# Patient Record
Sex: Female | Born: 1945 | Race: Black or African American | Hispanic: No | Marital: Single | Smoking: Former smoker
Health system: Southern US, Community
[De-identification: ages and names within clinical notes are randomized; demographics above are authoritative.]

## PROBLEM LIST (undated history)

## (undated) DIAGNOSIS — R32 Unspecified urinary incontinence: Secondary | ICD-10-CM

## (undated) DIAGNOSIS — I498 Other specified cardiac arrhythmias: Secondary | ICD-10-CM

## (undated) DIAGNOSIS — N189 Chronic kidney disease, unspecified: Secondary | ICD-10-CM

## (undated) DIAGNOSIS — F0150 Vascular dementia without behavioral disturbance: Secondary | ICD-10-CM

## (undated) DIAGNOSIS — E785 Hyperlipidemia, unspecified: Secondary | ICD-10-CM

## (undated) DIAGNOSIS — I639 Cerebral infarction, unspecified: Secondary | ICD-10-CM

## (undated) DIAGNOSIS — L608 Other nail disorders: Secondary | ICD-10-CM

## (undated) DIAGNOSIS — I6990 Unspecified sequelae of unspecified cerebrovascular disease: Secondary | ICD-10-CM

## (undated) DIAGNOSIS — R7989 Other specified abnormal findings of blood chemistry: Secondary | ICD-10-CM

## (undated) DIAGNOSIS — R413 Other amnesia: Secondary | ICD-10-CM

## (undated) DIAGNOSIS — G459 Transient cerebral ischemic attack, unspecified: Secondary | ICD-10-CM

## (undated) DIAGNOSIS — I10 Essential (primary) hypertension: Secondary | ICD-10-CM

## (undated) DIAGNOSIS — E559 Vitamin D deficiency, unspecified: Secondary | ICD-10-CM

## (undated) DIAGNOSIS — H269 Unspecified cataract: Secondary | ICD-10-CM

## (undated) DIAGNOSIS — R55 Syncope and collapse: Secondary | ICD-10-CM

## (undated) DIAGNOSIS — M8580 Other specified disorders of bone density and structure, unspecified site: Secondary | ICD-10-CM

## (undated) DIAGNOSIS — N289 Disorder of kidney and ureter, unspecified: Secondary | ICD-10-CM

## (undated) DIAGNOSIS — G4700 Insomnia, unspecified: Secondary | ICD-10-CM

## (undated) HISTORY — DX: Insomnia, unspecified: G47.00

## (undated) HISTORY — DX: Essential (primary) hypertension: I10

## (undated) HISTORY — DX: Other nail disorders: L60.8

## (undated) HISTORY — DX: Other specified abnormal findings of blood chemistry: R79.89

## (undated) HISTORY — DX: Transient cerebral ischemic attack, unspecified: G45.9

## (undated) HISTORY — DX: Other amnesia: R41.3

## (undated) HISTORY — DX: Syncope and collapse: R55

## (undated) HISTORY — DX: Cerebral infarction, unspecified: I63.9

## (undated) HISTORY — DX: Vitamin D deficiency, unspecified: E55.9

## (undated) HISTORY — DX: Other specified disorders of bone density and structure, unspecified site: M85.80

## (undated) HISTORY — DX: Hyperlipidemia, unspecified: E78.5

## (undated) HISTORY — DX: Unspecified cataract: H26.9

## (undated) HISTORY — DX: Chronic kidney disease, unspecified: N18.9

## (undated) HISTORY — DX: Unspecified urinary incontinence: R32

## (undated) HISTORY — DX: Disorder of kidney and ureter, unspecified: N28.9

## (undated) HISTORY — DX: Vascular dementia, unspecified severity, without behavioral disturbance, psychotic disturbance, mood disturbance, and anxiety: F01.50

## (undated) HISTORY — DX: Unspecified sequelae of unspecified cerebrovascular disease: I69.90

## (undated) HISTORY — PX: NO PAST SURGERIES: SHX2092

## (undated) HISTORY — DX: Other specified cardiac arrhythmias: I49.8

---

## 1998-12-21 ENCOUNTER — Encounter: Payer: Self-pay | Admitting: Internal Medicine

## 2007-04-15 ENCOUNTER — Inpatient Hospital Stay (HOSPITAL_COMMUNITY): Admission: EM | Admit: 2007-04-15 | Discharge: 2007-04-17 | Payer: Self-pay | Admitting: Emergency Medicine

## 2007-04-16 ENCOUNTER — Encounter: Payer: Self-pay | Admitting: Cardiology

## 2007-04-16 ENCOUNTER — Ambulatory Visit: Payer: Self-pay | Admitting: Vascular Surgery

## 2007-04-16 ENCOUNTER — Ambulatory Visit: Payer: Self-pay | Admitting: Cardiology

## 2007-05-18 ENCOUNTER — Encounter: Admission: RE | Admit: 2007-05-18 | Discharge: 2007-07-26 | Payer: Self-pay | Admitting: Internal Medicine

## 2007-06-22 ENCOUNTER — Encounter: Admission: RE | Admit: 2007-06-22 | Discharge: 2007-07-26 | Payer: Self-pay | Admitting: Specialist

## 2008-02-22 ENCOUNTER — Emergency Department (HOSPITAL_COMMUNITY): Admission: EM | Admit: 2008-02-22 | Discharge: 2008-02-22 | Payer: Self-pay | Admitting: Emergency Medicine

## 2008-06-13 ENCOUNTER — Inpatient Hospital Stay (HOSPITAL_COMMUNITY): Admission: EM | Admit: 2008-06-13 | Discharge: 2008-06-17 | Payer: Self-pay | Admitting: Emergency Medicine

## 2009-08-18 ENCOUNTER — Encounter: Admission: RE | Admit: 2009-08-18 | Discharge: 2009-08-18 | Payer: Self-pay | Admitting: Neurology

## 2011-04-22 NOTE — H&P (Signed)
NAMEMARZETTA, [name]                 ACCOUNT NO.:  [id number]   MEDICAL RECORD NO.:  [id number]          PATIENT TYPE:  INP   LOCATION:  0101                         FACILITY:  [facility]   PHYSICIAN:  Michelene Gardener, MD    DATE OF BIRTH:  [birth year]   DATE OF ADMISSION:  06/13/2008  DATE OF DISCHARGE:                              HISTORY & PHYSICAL   PRIMARY PHYSICIAN:  Dr. Barbee Shropshire.   CHIEF COMPLAINT:  The patient was brought for episode of fainting and  unresponsiveness.   HISTORY OF PRESENT ILLNESS:  This is 65 year old African American female  with past medical history of dementia, hyperlipidemia, hypertension and  stroke who presented with the above-mentioned complaint.  The patient  does not remember anything about what happened to.  Her daughter is  present at bedside, and most of the information was obtained from her.  As per her daughter, the patient went today for her senior citizen  exercise class.  While she was following the instructor, she became more  fatigued, and she was not able to do much, and all of the sudden, she  collapsed, and she was in the floor staring and was not responding to  any one.  That persisted for few minutes, and then she was awake but she  was confused and agitated.  They did not notice any tongue bite, and  they did not notice any shaking or urinary incontinence.  This patient  had 3 episodes before that.  The last one was around a few months ago,  and as per the daughter at that time, the patient was brought to the ER  but was sent home, and she was followed with her primary physician where  echocardiogram and carotid Doppler were done, and both of them were  negative.  A CT scan of the head was done in the ER, and it showed no  acute findings. Currently, the patient is awake, does not have any  complaints, denied chest pain.  There is no shortness of breath.  There  is no dizziness, and there are no palpitations.   PAST MEDICAL HISTORY:  Is  significant for:  1. Recurrent syncope that was attributed to low blood pressure.  2. Hypertension.  3. Diabetes mellitus.  4. Cataract.  5. Hyperlipidemia.   PAST SURGICAL HISTORY:  Denied.   ALLERGIES:  No known drug allergies.   CURRENT MEDICATIONS:  1. Aspirin 81 mg p.o. once daily.  2. Lisinopril/hydrochlorothiazide 20/12.5 mg twice daily.  3. Simvastatin 20 mg once a day.  4. Labetalol 200 mg twice daily.  5. Norvasc 10 mg once a day.  6. Aricept 20 mg once a day.   SOCIAL HISTORY:  Quit smoking a few months ago.  Prior to that, she was  smoking one pack per day for many years.  She denied alcohol drinking.  She has 1 child.  Her daughter, [name], is supportive, and her contact  number is [phone].   FAMILY HISTORY:  Her mother died at age 7; she had history of  hypertension.  Father died of unknown causes.  REVIEW OF SYSTEMS:  CONSTITUTIONAL:  No fatigability, no fever.  EYES:  No blurred vision.  ENT:  No tinnitus.  RESPIRATORY:  No cough or  wheezes.  CARDIOVASCULAR:  Positive for syncope.  There is no chest  pain.  There is no shortness of breath.  GASTROINTESTINAL:  No nausea,  no vomiting, no diarrhea.  GENITOURINARY:  No dysuria, no hematuria.  ENDOCRINE:  No polyuria, no nocturia.  HEMATOLOGIC:  No bruises, no  bleeding.  INFECTIOUS DISEASE:  No rash, no lesions.  NEUROLOGICAL:  No  numbness or tingling.  Rest of systems reviewed, and they were negative.   PHYSICAL EXAMINATION:  VITAL SIGNS: Temperature is 98.6, blood pressure  is 118/56, pulse 62, respiratory rate 18.  GENERAL APPEARANCE:  This is middle-aged [place] American female not in  acute distress.  HEENT:  Her conjunctivae are pink.  Pupils are equal and reactive to  light.  There is no ptosis.  Hearing intact.  There is no ear discharge  or infection.  There is no nose infection or bleeding.  Oral mucosa is  dry.  No pharyngeal erythema.  NECK:  Is supple.  No JVD, no carotid bruit, no thyroid  enlargement or  thyroid tenderness.  CARDIOVASCULAR EXAMINATION:  S1 and S2 are regular.  There are no  murmurs, no gallops and no thrills.  RESPIRATORY EXAMINATION:  The patient is breathing between 16 to 23.  There are no rales, no rhonchi, no wheezes.  ABDOMINAL EXAM:  The abdomen is soft, not distended, no tenderness,  hepatosplenomegaly.  Bowel sounds are normal.  LOWER EXTREMITIES:  No edema, no rash, no varicose veins.  SKIN:  No rash and no erythema.  NEUROLOGICAL:  The patient is alert, awake, and oriented, but she still  had short term memory abnormalities.  Cranial nerves are intact from II-  XII.  There is no motor or sensory deficits.   LABORATORY DATA:  WBC 8.6, hemoglobin 13.5, hematocrit 40, MCV 87.8,  platelet count 269.  Troponin less than 0.05.  Sodium 140, potassium  4.4, chloride 103, bicarb 26, glucose 137, BUN 28, creatinine 1.71,  calcium 9.9.  CT scan of the head showed chronic small vessel disease  with no acute abnormalities.   IMPRESSION:  1. Syncope versus seizure.  2. Hypertension.  3. History of cerebral vascular accident.  4. Dementia.  5. Hyperlipidemia.   PLAN:  This patient had prior episode of syncope 2 or 3 times; the last  one was around 4 to 5 months ago.  At that time, the patient presented  to the ER and was followed by her primary doctor.  She had recent  echocardiogram done on May 8, and it showed ejection fraction of 55-65%.  It showed severe left ventricular hypertrophy.  There is no embolic  source for her possible syncope.  As per the daughter, she also had  carotid Doppler done in her primary care office, and it was negative,  and it was done around few months ago.  From the description, it is not  very clear if this is syncope or seizure, because her daughter said that  her eyes were still open; she was stating.  She is not quite sure if her  eyes were rolling.  Most likely, this is syncope, but the possibility of  seizure is  still present.  EMS documentation showed blood pressure a  little bit on the low side but nothing significant to induce syncope.  I  will admit her  to telemetry.  Will get 3 sets of troponin and cardiac  enzymes.  I will not do an echocardiogram or carotid Doppler because  they were recently done.  CT scan of the head was done, and it was  negative.  I will get MRI of the brain.  I will also do an EEG.  Will  also consider the possibility of consulting neurology.  The patient is  already taking a lot of medications for hypertension, and I am afraid  she might have some episodes of hypotension.  For now, I will start her  on those medications but will monitor her blood pressure very closely  and will adjust her medications as needed.   Total assessment time is 1 hour.      Michelene Gardener, MD  Electronically Signed     NAE/MEDQ  D:  06/13/2008  T:  06/13/2008  Job:  [id number]   cc:   Olene Craven, M.D.  Fax: [phone]

## 2011-04-22 NOTE — Consult Note (Signed)
[name], [name]                 ACCOUNT NO.:  [id number]   MEDICAL RECORD NO.:  [id number]          PATIENT TYPE:  INP   LOCATION:  3739                         FACILITY:  [facility]   PHYSICIAN:  Pramod P. Pearlean Brownie, MD    DATE OF BIRTH:  Dec 05, 1946   DATE OF CONSULTATION:  DATE OF DISCHARGE:                                 CONSULTATION   REFERRING PHYSICIAN:  [facility].   REASON FOR REFERRAL:  Stroke.   HISTORY OF PRESENT ILLNESS:  [name] is a 65 year old African  American lady who was admitted for evaluation for sudden onset of  confusion and disorientation for the last 2 days.  She denies any focal  extremity weakness, but did admit to some dragging of the right leg.  The daughter, who is present at the bedside, has noticed that for the  last 6 months, the patient has been forgetful and has been disoriented  at times.  She has been found to be hypertensive and recently started on  treatment.  She has denied previous history of strokes.   PAST MEDICAL HISTORY:  1. Hypertension.  2. Hyperlipidemia.  3. Tobacco abuse.   HOME MEDICATIONS:  Clonidine, aspirin, lisinopril, diltiazem, Zocor,  Benicar and Norvasc.   SOCIAL HISTORY:  The patient still works as a Advertising copywriter.  She smoked,  but quit 2 months ago.  She does not drink alcohol.  She lives with her  daughter.   FAMILY HISTORY:  Not significant for stroke.   ALLERGIES TO MEDICATIONS:  None known.   PHYSICAL EXAM:  GENERAL:  Exam reveals a frail, middle-aged [place]  American lady who is not in distress.  VITAL SIGNS:  Afebrile, pulse rate 78 per minute, regular, respiratory  rate 18 per minute, temperature 98.2, blood pressure 137/69.  EXTREMITIES:  Distal pulses are well felt.  HEAD:  Nontraumatic.  NECK:  Supple without bruit.  ENT:  Exam unremarkable.  CARDIAC:  Regular heart sounds.  LUNGS:  Clear to auscultation.  ABDOMEN:  Soft, nontender.  NEUROLOGICAL:  She is awake, alert, oriented x3 with normal  speech and  language function.  There is no aphasia, apraxia or dysarthria.  Pupils  are equal and reactive.  Eye movements are full range without nystagmus.  Face is symmetric.  Palatal movements are normal.  Tongue is midline.  Motor system exam reveals no upper extremity drift, symmetric strength,  tone, reflexes coordination, sensation.  She has diminished fine finger  movements in the left hand.  Left grip is weaker than the right.  She  has some subjective sensory loss in the right foot.  On the Mini Mental  Status Exam, she is correlating all of 30 with deficits in recall,  attention and registration.  On animal fluency test, she scored only 2.   DATA REVIEWED:  MRI scan of the brain shows small acute infarct  involving the ventral thalamus on the left.  Multiple remote lacunar  infarcts are noted involving the right pons, left middle cerebral  peduncle, corona radiata bilaterally.  Mild degree of generalized  cerebral atrophy is seen.  Carotid ultrasound shows no significant stenosis.   Two-dimensional echo is done; results are pending.   Hemoglobin A1c is 6.0.  TSH is normal.  Lipid profile is pending.   IMPRESSION:  [age]year lady with sudden onset of confusion and some  right leg weakness, likely secondary to a left thalamic infarction;  etiology, small vessel disease from hypertension and smoking.  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   PLAN:  I will recommend starting aspirin for secondary stroke  prevention.  I suspect if her compliance is good in the future, we may  consider switching her to Aggrenox.  Strict control of hypertension with  systolic blood pressure goal below 130 and hyperlipidemia with LDL goal  below 100.  Physical Therapy consult for gait and balance training.  I  had a long discussion with the patient and her daughter and answered  questions.  Kindly call for questions.  Outpatient followup and  treatment for  dementia.           ______________________________  Sunny Schlein. Pearlean Brownie, MD     PPS/MEDQ  D:  04/16/2007  T:  04/17/2007  Job:  [id number]

## 2011-04-22 NOTE — Procedures (Signed)
This is a 65 year old a female patient who is admitted after a possible  fainting episode.  The patient collapsed during an exercise class and  was found unresponsive with eyes wide open and staring on the floor.  This lasted supposedly a minute or two.  When she awakened, she was  confused and agitated.  She has a history of dementia, hypertension,  hyperlipidemia, and stroke in spite of her young age.   MEDICATIONS:  Prinivil, Zocor, Aricept, Norvasc, heparin, aspirin,  Zofran, Tylenol, and oxycodone.   Hyperventilation and photic stimulation were both not performed.   Posterior dominant background rhythm is seen at 8 Hz and shows  persistently very low amplitude.  Reviewed in a referential bipolar  montage, there is a symmetric well-organized background noted and some  beta fast activity is seen over the frontal polar and frontal regions,  not constituting an abnormality.  EKG shows a normal sinus rhythm at 68  beats per minute.  The patient became drowsy during this recording, but no epileptiform  activity was noted and true sleep architecture was not seen.  During  drowsiness, the highest amplitudes were seen over the temporal areas  bilaterally with a phase reversal over T6 and T5.   CONCLUSION:  This is a normal EEG for the patient's age and conscious  state with a remarkable low amplitude.  The posterior dominant rhythm  does not give evidence of an encephalopathy.  Please consider to repeat the study with hyperventilation as this  maneuver is more likely to bring out staring attacks or absence  seizures.      Melvyn Novas, M.D.  Electronically Signed     [id number]  D:  06/15/2008 16:28:04  T:  06/16/2008 04:48:04  Job #:  [id number]

## 2011-04-22 NOTE — Discharge Summary (Signed)
NAMEHUMA, [name]                 ACCOUNT NO.:  [id number]   MEDICAL RECORD NO.:  [id number]          PATIENT TYPE:  INP   LOCATION:  3739                         FACILITY:  [facility]   PHYSICIAN:  Hettie Holstein, D.O.    DATE OF BIRTH:  [birth year]   DATE OF ADMISSION:  04/15/2007  DATE OF DISCHARGE:  04/17/2007                               DISCHARGE SUMMARY   PRIMARY CARE PHYSICIAN:  Dr. Barbee Shropshire.   CONSULTING NEUROLOGIST:  This admission:  Dr. Pearlean Brownie, [facility].   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   ADDITIONAL DIAGNOSES:  1. Hypertension with 2D echocardiogram evidence of severe left      ventricular hypertrophy, ejection fraction of 55-65% and evidence      of diastolic dysfunction.  2. Acute renal failure, felt to be secondary to concomitant ACE      inhibitor and ARB.  Her most recent GFR is 31.  Her ACE inhibitor      and angiotensin receptor blockers are being held at this time;      however, after reassessment of her renal function, one of these      agents can be resumed with close monitoring, as these may assist in      regressing her degree of left ventricular hypertrophy, if her renal      function permits.  3. Hyperlipidemia.  LDL this admission revealed an LDL of 103.   STUDIES PERFORMED THIS ADMISSION:  MRI of her brain which revealed  multiple punctate micro-hemorrhages in the cerebellum, basilar ganglia  and acute sub-centimeter left ventral medial thalamic infarct and  extensive small vessel disease, multiple widespread foci of sub-  centimeter chronic hemorrhages.  This pattern is most consistent with  cerebral and __________ disease.  A 2D echocardiogram is described  above, revealed increased LV wall thickness.  Left atrial dilation and  superior left ventricular hypertrophy, ejection fraction 55-65%,  hemoglobin A1c 6.0, LDL 103, HDL 65, albumin 3.3, BUN 20, creatinine  1.6, AST  20 and ALT 14, TSH 1.132.  Carotid Dopplers reveal no  significant stenosis.  EKG revealed left ventricular hypertrophy with  repolarization abnormality.   MEDICATIONS:  1. Labetalol with be used at 200 mg p.o. b.i.d.  2. Norvasc will be used at 5 mg p.o. q. day.  3. Her Ace inhibitor and ARBs have been held, due to impaired renal      function.  4. Zocor 40 mg daily  5. Aspirin 81 mg daily.  6. She was asked to discontinue clonidine, diltiazem and lisinopril.   She has remained normotensive during her hospital course on these  regimen.   HISTORY OF PRESENTING ILLNESS:  For full details, please refer to the  H&P; however briefly, [name] is a very pleasant 65 year old African-  American female who presented to the ER for sudden onset of confusion,  disorientation.  She denied any focal extremity weakness but did admit  to some dragging of the right leg.  The daughter noticed that, for the  last 6 months,  the patient has been forgetful and has been disoriented.  She was recently initiated on clonidine.  She underwent CT scanning of  the brain in the emergency department that revealed extensive white  matter disease.  There was MRI as well that was ordered in her hospital  course.  [name] underwent stroke evaluation.  MRI was revealing of  a stroke in the thalamic region.  She underwent a neurology  consultation.  She underwent stroke workup, with the results as outlined  above.  Her hospital course was without improvement.  Some of her anti-  hypertensive medications were adjusted, and she has tolerated this well  with normotension throughout her course.      Hettie Holstein, D.O.  Electronically Signed     ESS/MEDQ  D:  04/17/2007  T:  04/17/2007  Job:  [id number]   cc:   Olene Craven, M.D.  Pramod P. Pearlean Brownie, MD
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
## 2011-04-22 NOTE — Discharge Summary (Signed)
[name], [name]                 ACCOUNT NO.:  [id number]   MEDICAL RECORD NO.:  [id number]          PATIENT TYPE:  INP   LOCATION:  1414                         FACILITY:  [facility]   PHYSICIAN:  [name], M.D.DATE OF BIRTH:  June 20, 1946   DATE OF ADMISSION:  06/13/2008  DATE OF DISCHARGE:  06/17/2008                               DISCHARGE SUMMARY   PRIMARY CARE PHYSICIAN:  Dr. Coralee [name] with [facility].   DISCHARGE DIAGNOSES:  1. Recurrent syncope.      a.     Felt to be secondary to hypotension and bradycardia.      b.     Possibility of sick sinus syndrome must be considered if the       patient's symptoms recur.  C.  Resolved with minimization of       antihypertensive medications.      c.     Normal EEG during hospital stay.  2. Hypertension.  3. Cataracts.  4. Hyperlipidemia.  5. Recent cerebrovascular accident.  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  7. Recent history of tobacco abuse.   DISCHARGE MEDICATIONS:  1. Aspirin 81 mg p.o. daily.  2. Norvasc 10 mg p.o. daily.  3. Aricept 10 mg p.o. daily.  4. Zocor 20 mg p.o. daily.   FOLLOW UP:  The patient is advised to follow up Dr. Coralee [name] in 7 days  for routine reevaluation.  At that time, [name] pressure and heart rate  should be assessed.  If further antihypertensives are required, it would  be wise to avoid any medication that could potentially exert a negative  chronotropic effect.   PROCEDURE:  EEG June 15, 2008:  A normal EEG for the patient's age and  conscious state with a remarkable low amplitude.   CONSULTATIONS:  None.   HOSPITAL COURSE:  [name] is a very pleasant 65 year old female  who was admitted to the acute units on June 13, 2008 with an episode of  syncope.  The patient had been participating in a senior citizens  exercise class at the time of her symptoms.  She became quite fatigued.  Then quite unexpectedly, she collapsed to the floor and was  unresponsive.  When she  awoke, she was confused and a bit agitated.  There was no tonic/clonic type seizure activity and no urinary  incontinence.  The patient was noted to have had 3 similar episodes in  the past.  The patient was placed on telemetry.  No significant  arrhythmia was appreciated.  Cardiac enzymes were cycled x3 and were all  unrevealing.  TSH was obtained and was noted to be normal.  No  significant electrolyte abnormalities were appreciated and renal  function was noted to be essentially normal, although the patient did  appear slightly dehydrated at presentation.  The patient was gently  hydrated.  She was noted to be suffering with significant hypotension  and bradycardia with [name] pressure dipping into the 90 systolic and  heart rate dipping into the mid 40s.  The patient's beta-blocker was  discontinued as was her ACE inhibitor diuretic  combination.  With this,  the patient's [name] pressure improved nicely.  Her heart rate also  improved and stabilized in the 50-80 range.  The patient suffered no  further episodes of syncope.  EEG was accomplished to rule out the  possibility of seizure given the patient's recent stroke, but there was  no evidence of such.   With simple modification of the patient's antihypertensive regimen and  with improvement in the patient's bradycardia, the patient's symptoms  resolved.  She ambulated about her room without difficulty.  With no  further acute issues, the patient was cleared for discharge home to the  care of her daughter.  Followup is recommended with her primary care  physician, Dr. Coralee [name] within 1 week for reevaluation of the patient's  heart rate and [name] pressure.      [name], M.D.  Electronically Signed     JTM/MEDQ  D:  06/17/2008  T:  06/17/2008  Job:  [id number]   cc:   [name] Snide, MD

## 2011-05-07 ENCOUNTER — Other Ambulatory Visit: Payer: Self-pay | Admitting: Internal Medicine

## 2011-05-07 DIAGNOSIS — Z780 Asymptomatic menopausal state: Secondary | ICD-10-CM

## 2011-05-07 DIAGNOSIS — Z1231 Encounter for screening mammogram for malignant neoplasm of breast: Secondary | ICD-10-CM

## 2011-05-28 ENCOUNTER — Other Ambulatory Visit: Payer: Self-pay

## 2011-05-28 ENCOUNTER — Ambulatory Visit
Admission: RE | Admit: 2011-05-28 | Discharge: 2011-05-28 | Disposition: A | Discharge status: Home / Self Care | Payer: Medicare Other | Source: Ambulatory Visit | Attending: Internal Medicine | Admitting: Internal Medicine

## 2011-05-28 ENCOUNTER — Ambulatory Visit: Payer: Self-pay

## 2011-05-28 DIAGNOSIS — Z1231 Encounter for screening mammogram for malignant neoplasm of breast: Secondary | ICD-10-CM

## 2011-05-28 DIAGNOSIS — Z780 Asymptomatic menopausal state: Secondary | ICD-10-CM

## 2011-06-13 ENCOUNTER — Other Ambulatory Visit: Payer: Self-pay | Admitting: Internal Medicine

## 2011-07-07 ENCOUNTER — Encounter: Payer: Self-pay | Admitting: Podiatry

## 2011-09-01 LAB — I-STAT 8, (EC8 V) (CONVERTED LAB)
BUN: 27 — ABNORMAL HIGH
Bicarbonate: 27.1 — ABNORMAL HIGH
Chloride: 107
Glucose, Bld: 128 — ABNORMAL HIGH
Hemoglobin: 14.3
Sodium: 139

## 2011-09-01 LAB — DIFFERENTIAL
Eosinophils Relative: 0
Monocytes Relative: 6
Neutrophils Relative %: 56

## 2011-09-01 LAB — POCT CARDIAC MARKERS
Myoglobin, poc: 163
Operator id: 234501
Operator id: 234501
Troponin i, poc: <0.05

## 2011-09-01 LAB — POCT I-STAT CREATININE
Creatinine, Ser: 1.8 — ABNORMAL HIGH
Operator id: 234501

## 2011-09-01 LAB — CBC
HCT: 39.3
MCV: 88.1
RBC: 4.46
WBC: 6.9

## 2011-09-04 LAB — BASIC METABOLIC PANEL
BUN: 16
BUN: 28 — ABNORMAL HIGH
CO2: 26
Chloride: 103
Creatinine, Ser: 1.45 — ABNORMAL HIGH
Creatinine, Ser: 1.71 — ABNORMAL HIGH
GFR calc non Af Amer: 36 — ABNORMAL LOW
Glucose, Bld: 127 — ABNORMAL HIGH
Glucose, Bld: 96

## 2011-09-04 LAB — CARDIAC PANEL(CRET KIN+CKTOT+MB+TROPI)
CK, MB: 3.1
CK, MB: 3.1
Total CK: 85
Total CK: 97
Troponin I: 0.03

## 2011-09-04 LAB — POCT CARDIAC MARKERS: Operator id: 231701

## 2011-09-04 LAB — MAGNESIUM: Magnesium: 2

## 2011-09-04 LAB — CBC
HCT: 34.8 — ABNORMAL LOW
HCT: 40
MCHC: 33.7
MCV: 87.8
MCV: 88.6
Platelets: 238
Platelets: 269
RDW: 12
RDW: 12
WBC: 7.3
WBC: 8.6

## 2013-03-02 ENCOUNTER — Ambulatory Visit: Payer: Self-pay | Admitting: Neurology

## 2013-03-30 ENCOUNTER — Ambulatory Visit (INDEPENDENT_AMBULATORY_CARE_PROVIDER_SITE_OTHER): Payer: Medicare Other | Admitting: Neurology

## 2013-03-30 ENCOUNTER — Encounter: Payer: Self-pay | Admitting: Neurology

## 2013-03-30 VITALS — BP 140/74 | HR 75 | Temp 98.0°F | Ht 65.0 in | Wt 246.0 lb

## 2013-03-30 DIAGNOSIS — F0390 Unspecified dementia without behavioral disturbance: Secondary | ICD-10-CM

## 2013-03-30 NOTE — Progress Notes (Signed)
[facility] [address] [place]. [place] [address] [phone]       OFFICE FOLLOW-UP NOTE  Ms. 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 Also mild vascular dementia She returns for f/u after last visit on 07/29/12. Daughter feels she is doing about the same and tolerating namenda and aricept well without side effects. She is having a bad day today in our office and scored 21/30 on MMSE compared with 24/30 at last visit.She is living at home and is mostly independent with activities of ADLs and can be left alone for few hours at a times.Thher have been no safety concernsShe has no delusions or hallucinations. ROS:   14 system review of systems is positive for memory loss only  PMH:  Past Medical History  Diagnosis Date  . Hypertension   . Stroke   . Memory loss     Social History:  History   Social History  . Marital Status: Single    Spouse Name: N/A    Number of Children: N/A  . Years of Education: N/A   Occupational History  . Not on file.   Social History Main Topics  . Smoking status: Not on file  . Smokeless tobacco: Not on file  . Alcohol Use: Not on file  . Drug Use: Not on file  . Sexually Active: Not on file   Other Topics Concern  . Not on file   Social History Narrative  . No narrative on file    Medications:   No current outpatient prescriptions on file prior to visit.   No current facility-administered medications on file prior to visit.    Allergies:  No Known Allergies  Physical Exam General: well developed, well nourished, seated, in no evident distress Head: head normocephalic and atraumatic. Orohparynx benign Neck: supple with no carotid or supraclavicular bruits Cardiovascular: regular rate and rhythm, no murmurs Musculoskeletal: no deformity Skin:  no  rash/petichiae Vascular:  Normal pulses all extremities  Neurologic Exam Mental Status: Awake and fully alert. Oriented to place and time. Recent and remote memory intact. Attention span, concentration and fund of knowledge appropriate. Mood and affect appropriate. MMSE 21/30 with deficits in recall,orientation and 3 step commands Cranial Nerves: Fundoscopic exam reveals sharp disc margins. Pupils equal, briskly reactive to light. Extraocular movements full without nystagmus. Visual fields full to confrontation. Hearing intact. Facial sensation intact. Face, tongue, palate moves normally and symmetrically.  Motor: Normal bulk and tone. Normal strength in all tested extremity muscles. Sensory.: intact to tough and pinprick and vibratory.  Coordination: Rapid alternating movements normal in all extremities. Finger-to-nose and heel-to-shin performed accurately bilaterally. Gait and Station: Arises from chair without difficulty. Stance is normal. Gait demonstrates normal stride length and balance .Unable to heel, toe and tandem walk without difficulty.  Reflexes: 1+ and symmetric. Toes downgoing.     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

## 2013-03-30 NOTE — Patient Instructions (Signed)
Continue Aricept and Namenda and the current dosages for dementia. No testing is indicated at the current time. Return for followup in 6 months with nurse practitioner Larita Fife

## 2013-03-31 ENCOUNTER — Encounter: Payer: Self-pay | Admitting: Internal Medicine

## 2013-04-11 ENCOUNTER — Telehealth: Payer: Self-pay | Admitting: Neurology

## 2013-04-25 ENCOUNTER — Encounter: Payer: Self-pay | Admitting: Internal Medicine

## 2013-04-25 ENCOUNTER — Ambulatory Visit (INDEPENDENT_AMBULATORY_CARE_PROVIDER_SITE_OTHER): Payer: Medicare Other | Admitting: Internal Medicine

## 2013-04-25 VITALS — BP 122/66 | HR 73 | Temp 97.9°F | Resp 20 | Ht 66.0 in | Wt 245.0 lb

## 2013-04-25 DIAGNOSIS — E785 Hyperlipidemia, unspecified: Secondary | ICD-10-CM | POA: Insufficient documentation

## 2013-04-25 DIAGNOSIS — I10 Essential (primary) hypertension: Secondary | ICD-10-CM

## 2013-04-25 DIAGNOSIS — F0150 Vascular dementia without behavioral disturbance: Secondary | ICD-10-CM | POA: Insufficient documentation

## 2013-04-25 DIAGNOSIS — R739 Hyperglycemia, unspecified: Secondary | ICD-10-CM

## 2013-04-25 DIAGNOSIS — N189 Chronic kidney disease, unspecified: Secondary | ICD-10-CM | POA: Insufficient documentation

## 2013-04-25 DIAGNOSIS — R7309 Other abnormal glucose: Secondary | ICD-10-CM

## 2013-04-25 DIAGNOSIS — Z1231 Encounter for screening mammogram for malignant neoplasm of breast: Secondary | ICD-10-CM

## 2013-04-25 DIAGNOSIS — Z1239 Encounter for other screening for malignant neoplasm of breast: Secondary | ICD-10-CM

## 2013-04-25 NOTE — Progress Notes (Signed)
Patient ID: [name], female   DOB: Jan 22, 1946, 67 y.o.   MRN: [id number]  No Known Allergies  Chief Complaint  Patient presents with  . Annual Exam    no new problems   HPI: Patient is a 67 y.o. AA female seen in the office today for annual exam.  Pt and daughter are w/o new concerns.    Blood pressure is good today.  Memory unchanged per daughter.  Neurology visit was stable.    Review of Systems:  Review of Systems  Constitutional: Negative for fever and chills.  Eyes: Negative for blurred vision.  Respiratory: Negative for shortness of breath.   Cardiovascular: Negative for chest pain.  Gastrointestinal: Negative for constipation.  Genitourinary: Negative for dysuria.       Rare incontinence episodes  Musculoskeletal: Positive for falls. Negative for joint pain.       Sprained ankle (left), slipped while walking around  Neurological: Negative for dizziness, weakness and headaches.  Psychiatric/Behavioral: Positive for memory loss. Negative for depression. The patient is not nervous/anxious and does not have insomnia.      Past Medical History  Diagnosis Date  . Hypertension   . Stroke   . Memory loss   . Other abnormal blood chemistry   . Unspecified vitamin D deficiency   . Unspecified late effects of cerebrovascular disease   . Other specified disease of nail   . Insomnia, unspecified   . Chronic kidney disease   . Unspecified urinary incontinence   . Other specified cardiac dysrhythmias   . Syncope and collapse   . Unspecified disorder of kidney and ureter   . Hyperlipidemia   . Vascular dementia, uncomplicated   . Unspecified transient cerebral ischemia    History reviewed. No pertinent past surgical history. Social History:   reports that she has quit smoking. She does not have any smokeless tobacco history on file. Her alcohol and drug histories are not on file.  Family History  Problem Relation Age of Onset  . Pneumonia Mother   . Kidney disease  Sister   . Hypertension Brother     Medications: Patient's Medications  New Prescriptions   No medications on file  Previous Medications   AMLODIPINE (NORVASC) 10 MG TABLET    1 tablet daily.    DONEPEZIL (ARICEPT) 10 MG TABLET    10 mg at bedtime.    HYDRALAZINE (APRESOLINE) 25 MG TABLET    25 mg 3 (three) times daily.    LOSARTAN (COZAAR) 50 MG TABLET    25 mg daily.    NAMENDA 10 MG TABLET    10 mg 2 (two) times daily.    OXYBUTYNIN (DITROPAN) 5 MG TABLET    5 mg daily.    SIMVASTATIN (ZOCOR) 20 MG TABLET    daily.   Modified Medications   No medications on file  Discontinued Medications   No medications on file     Physical Exam:  Filed Vitals:   04/25/13 1428  BP: 122/66  Pulse: 73  Temp: 97.9 F (36.6 C)  TempSrc: Oral  Resp: 20  Height: 5\' 6"  (1.676 m)  Weight: 245 lb (111.131 kg)  SpO2: 99%   Physical Exam  Constitutional: She is oriented to person, place, and time. She appears well-developed and well-nourished. No distress.  Obese AA female  HENT:  Head: Normocephalic and atraumatic.  Right Ear: External ear normal.  Left Ear: External ear normal.  Nose: Nose normal.  Mouth/Throat: Oropharynx is clear and moist. No  oropharyngeal exudate.  Eyes: Conjunctivae and EOM are normal. Pupils are equal, round, and reactive to light. Right eye exhibits no discharge. Left eye exhibits no discharge. No scleral icterus.  Neck: Normal range of motion. Neck supple. No JVD present. No tracheal deviation present. No thyromegaly present.  Cardiovascular: Normal rate, regular rhythm, normal heart sounds and intact distal pulses.  Exam reveals no gallop and no friction rub.   No murmur heard. Pulmonary/Chest: Effort normal and breath sounds normal. No respiratory distress. She has no wheezes. She has no rales. She exhibits no tenderness.  Abdominal: Soft. Bowel sounds are normal. She exhibits no distension and no mass. There is no tenderness.  Heme negative DRE   Genitourinary: Guaiac negative stool.  Musculoskeletal: Normal range of motion. She exhibits no edema and no tenderness.  Lymphadenopathy:    She has no cervical adenopathy.  Neurological: She is alert and oriented to person, place, and time. She has normal reflexes. No cranial nerve deficit.  Skin: Skin is warm and dry.  Psychiatric:  Very excitable today    Assessment/Plan Vascular dementia, uncomplicated dtr notes stable.  Behaving differently than i have seen, but said to be her usual mental state at home  Hyperlipidemia Check flp today.  Cont simvastatin 20mg   Chronic kidney disease Stage 3.  Thought to be due to HTN.  Avoid nsaids.  Goal is <150/90 due to comorbid illnesses  Hypertension At goal.  No changes  Labs/tests ordered:  Cbc, bmp, flp

## 2013-04-26 LAB — COMPREHENSIVE METABOLIC PANEL
ALT: 16 IU/L (ref 0–32)
AST: 20 IU/L (ref 0–40)
Albumin/Globulin Ratio: 1.4 (ref 1.1–2.5)
Albumin: 4.5 g/dL (ref 3.6–4.8)
Alkaline Phosphatase: 105 IU/L (ref 39–117)
BUN/Creatinine Ratio: 14 (ref 11–26)
BUN: 20 mg/dL (ref 8–27)
CO2: 21 mmol/L (ref 19–28)
Calcium: 10.4 mg/dL — ABNORMAL HIGH (ref 8.6–10.2)
Chloride: 102 mmol/L (ref 97–108)
Creatinine, Ser: 1.39 mg/dL — ABNORMAL HIGH (ref 0.57–1.00)
GFR calc Af Amer: 45 mL/min/{1.73_m2} — ABNORMAL LOW (ref >59)
GFR calc non Af Amer: 39 mL/min/{1.73_m2} — ABNORMAL LOW (ref >59)
Globulin, Total: 3.3 g/dL (ref 1.5–4.5)
Glucose: 100 mg/dL — ABNORMAL HIGH (ref 65–99)
Potassium: 4.5 mmol/L (ref 3.5–5.2)
Sodium: 143 mmol/L (ref 134–144)
Total Bilirubin: 0.4 mg/dL (ref 0.0–1.2)
Total Protein: 7.8 g/dL (ref 6.0–8.5)

## 2013-04-26 LAB — CBC WITH DIFFERENTIAL/PLATELET
Basophils Absolute: 0 10*3/uL (ref 0.0–0.2)
Basos: 0 % (ref 0–3)
Eos: 1 % (ref 0–5)
Eosinophils Absolute: 0.1 10*3/uL (ref 0.0–0.4)
HCT: 45.1 % (ref 34.0–46.6)
Hemoglobin: 14.7 g/dL (ref 11.1–15.9)
Immature Grans (Abs): 0 10*3/uL (ref 0.0–0.1)
Immature Granulocytes: 0 % (ref 0–2)
Lymphocytes Absolute: 3.6 10*3/uL — ABNORMAL HIGH (ref 0.7–3.1)
Lymphs: 42 % (ref 14–46)
MCH: 28.1 pg (ref 26.6–33.0)
MCHC: 32.6 g/dL (ref 31.5–35.7)
MCV: 86 fL (ref 79–97)
Monocytes Absolute: 0.7 10*3/uL (ref 0.1–0.9)
Monocytes: 8 % (ref 4–12)
Neutrophils Absolute: 4.1 10*3/uL (ref 1.4–7.0)
Neutrophils Relative %: 49 % (ref 40–74)
RBC: 5.24 x10E6/uL (ref 3.77–5.28)
RDW: 13.3 % (ref 12.3–15.4)
WBC: 8.4 10*3/uL (ref 3.4–10.8)

## 2013-04-26 LAB — HEMOGLOBIN A1C
Est. average glucose Bld gHb Est-mCnc: 114 mg/dL
Hgb A1c MFr Bld: 5.6 % (ref 4.8–5.6)

## 2013-05-01 NOTE — Assessment & Plan Note (Signed)
Stage 3.  Thought to be due to HTN.  Avoid nsaids.  Goal is <150/90 due to comorbid illnesses

## 2013-05-01 NOTE — Assessment & Plan Note (Signed)
Check flp today.  Cont simvastatin 20mg 

## 2013-05-01 NOTE — Assessment & Plan Note (Signed)
dtr notes stable.  Behaving differently than i have seen, but said to be her usual mental state at home

## 2013-05-01 NOTE — Assessment & Plan Note (Signed)
At goal. No changes.  

## 2013-10-13 ENCOUNTER — Other Ambulatory Visit: Payer: Medicare Other

## 2013-10-13 DIAGNOSIS — E785 Hyperlipidemia, unspecified: Secondary | ICD-10-CM

## 2013-10-14 LAB — LIPID PANEL
Chol/HDL Ratio: 2.1 ratio units (ref 0.0–4.4)
Cholesterol, Total: 165 mg/dL (ref 100–199)
HDL: 79 mg/dL (ref >39)
LDL Calculated: 76 mg/dL (ref 0–99)
Triglycerides: 49 mg/dL (ref 0–149)
VLDL Cholesterol Cal: 10 mg/dL (ref 5–40)

## 2013-10-17 ENCOUNTER — Ambulatory Visit (INDEPENDENT_AMBULATORY_CARE_PROVIDER_SITE_OTHER): Payer: Medicare Other | Admitting: Internal Medicine

## 2013-10-17 ENCOUNTER — Encounter: Payer: Self-pay | Admitting: Internal Medicine

## 2013-10-17 VITALS — BP 130/82 | HR 72 | Temp 98.5°F | Wt 247.0 lb

## 2013-10-17 DIAGNOSIS — N3941 Urge incontinence: Secondary | ICD-10-CM

## 2013-10-17 DIAGNOSIS — Z23 Encounter for immunization: Secondary | ICD-10-CM

## 2013-10-17 DIAGNOSIS — E785 Hyperlipidemia, unspecified: Secondary | ICD-10-CM

## 2013-10-17 DIAGNOSIS — I10 Essential (primary) hypertension: Secondary | ICD-10-CM

## 2013-10-17 DIAGNOSIS — F0150 Vascular dementia without behavioral disturbance: Secondary | ICD-10-CM

## 2013-10-17 NOTE — Progress Notes (Signed)
Patient ID: [name], female   DOB: [birth year], 67 y.o.   MRN: [id number] Location:  [facility] [place] [facility]  Code Status: DNR   No Known Allergies  Chief Complaint  Patient presents with  . Medical Managment of Chronic Issues    6 month follow-up an discuss labs    HPI: Patient is a 67 y.o. AA female seen in the office today for medical management of chronic issues. No complaints today. Pt reports that BP has been consistently good. Pt believes that memory is stable and daughter agrees. Oxybutynin seems to be helping according to daughter.   Review of Systems:  Review of Systems  Constitutional: Negative for fever, chills and weight loss.  HENT: Negative for congestion.   Eyes: Negative for blurred vision and double vision.  Respiratory: Negative for shortness of breath.   Cardiovascular: Negative for chest pain.  Gastrointestinal: Negative for diarrhea and constipation.  Genitourinary: Negative for dysuria, urgency and frequency.  Musculoskeletal: Negative for falls.  Neurological: Negative for dizziness.  Psychiatric/Behavioral: Positive for memory loss. Negative for depression. The patient is not nervous/anxious.        Memory is stable     Past Medical History  Diagnosis Date  . Hypertension   . Stroke   . Memory loss   . Other abnormal blood chemistry   . Unspecified vitamin D deficiency   . Unspecified late effects of cerebrovascular disease   . Other specified disease of nail   . Insomnia, unspecified   . Chronic kidney disease   . Unspecified urinary incontinence   . Other specified cardiac dysrhythmias(427.89)   . Syncope and collapse   . Unspecified disorder of kidney and ureter   . Hyperlipidemia   . Vascular dementia, uncomplicated   . Unspecified transient cerebral ischemia     History reviewed. No pertinent past surgical history.  Social History:   reports that she has quit smoking. She does not have any smokeless  tobacco history on file. She reports that she does not drink alcohol or use illicit drugs.  Family History  Problem Relation Age of Onset  . Pneumonia Mother   . Kidney disease Sister   . Hypertension Brother     Medications: Patient's Medications  New Prescriptions   No medications on file  Previous Medications   AMLODIPINE (NORVASC) 10 MG TABLET    1 tablet daily.    DONEPEZIL (ARICEPT) 10 MG TABLET    10 mg at bedtime.    HYDRALAZINE (APRESOLINE) 25 MG TABLET    25 mg 3 (three) times daily.    LOSARTAN (COZAAR) 50 MG TABLET    25 mg daily.    NAMENDA 10 MG TABLET    10 mg 2 (two) times daily.    OXYBUTYNIN (DITROPAN) 5 MG TABLET    5 mg daily.    SIMVASTATIN (ZOCOR) 20 MG TABLET    daily.   Modified Medications   No medications on file  Discontinued Medications   No medications on file     Physical Exam: Filed Vitals:   10/17/13 1550  BP: 130/82  Pulse: 72  Temp: 98.5 F (36.9 C)  TempSrc: Oral  Weight: 247 lb (112.038 kg)  SpO2: 98%  Physical Exam  Constitutional: She appears well-developed and well-nourished.  HENT:  Head: Normocephalic and atraumatic.  Cardiovascular: Normal rate, regular rhythm, normal heart sounds and intact distal pulses.   Pulmonary/Chest: Effort normal and breath sounds normal.  Abdominal: Soft. Bowel sounds  are normal.  Neurological: She is alert.  Oriented to person, place, not time  Skin: Skin is warm and dry.  Psychiatric: She has a normal mood and affect.     Labs reviewed: Basic Metabolic Panel:  Recent Labs  21/30/86 1539  NA 143  K 4.5  CL 102  CO2 21  GLUCOSE 100*  BUN 20  CREATININE 1.39*  CALCIUM 10.4*   Liver Function Tests:  Recent Labs  04/25/13 1539  AST 20  ALT 16  ALKPHOS 105  BILITOT 0.4  PROT 7.8  CBC:  Recent Labs  04/25/13 1539  WBC 8.4  NEUTROABS 4.1  HGB 14.7  HCT 45.1  MCV 86   Lipid Panel:  Recent Labs  10/13/13 0958  HDL 79  LDLCALC 76  TRIG 49  CHOLHDL 2.1   Lab  Results  Component Value Date   HGBA1C 5.6 04/25/2013    Assessment/Plan 1. Need for prophylactic vaccination and inoculation against influenza - Received flu vaccine  2. Hyperlipidemia - Labs are within normal limits. Reviewed with patient - Continue Zocor   3. Vascular dementia, uncomplicated - Stable - continue Aricept and Namenda   4. Hypertension - Stable - Continue Amlodipine, Hydralazine, Cozaar  5.  Urge urinary incontinence Doing well w/o side effects with oxybutynin  Labs/tests ordered:  Will check labs day of next appt Next appt:  6 mos

## 2014-01-10 ENCOUNTER — Other Ambulatory Visit: Payer: Self-pay | Admitting: *Deleted

## 2014-01-10 MED ORDER — LOSARTAN POTASSIUM 50 MG PO TABS: ORAL_TABLET | ORAL | Status: DC

## 2014-01-10 MED ORDER — AMLODIPINE BESYLATE 10 MG PO TABS: ORAL_TABLET | ORAL | Status: DC

## 2014-01-10 MED ORDER — OXYBUTYNIN CHLORIDE 5 MG PO TABS: ORAL_TABLET | ORAL | Status: DC

## 2014-01-10 MED ORDER — DONEPEZIL HCL 10 MG PO TABS: ORAL_TABLET | ORAL | Status: DC

## 2014-01-10 MED ORDER — SIMVASTATIN 20 MG PO TABS: ORAL_TABLET | ORAL | Status: DC

## 2014-01-10 MED ORDER — MEMANTINE HCL 10 MG PO TABS: ORAL_TABLET | ORAL | Status: DC

## 2014-01-10 MED ORDER — HYDRALAZINE HCL 25 MG PO TABS: ORAL_TABLET | ORAL | Status: DC

## 2014-02-06 ENCOUNTER — Telehealth: Payer: Self-pay | Admitting: *Deleted

## 2014-02-06 ENCOUNTER — Other Ambulatory Visit: Payer: Self-pay | Admitting: *Deleted

## 2014-02-06 NOTE — Telephone Encounter (Signed)
Left message on voicemail for patient to return call when available   

## 2014-02-06 NOTE — Telephone Encounter (Signed)
She can take the regular namenda and then we can change it to namenda XR 28mg  daily for future fills.

## 2014-02-06 NOTE — Telephone Encounter (Signed)
[name],patient daughter, called and stated that Mail order sent her regular Namenda and it is suppose to be Namenda XR, but in our system we have regular Namenda.  Please Advise.   Mail Order: [facility]

## 2014-02-07 NOTE — Telephone Encounter (Signed)
Left message on voicemail for patient to return call when available   

## 2014-02-08 ENCOUNTER — Other Ambulatory Visit: Payer: Self-pay | Admitting: *Deleted

## 2014-02-08 MED ORDER — MEMANTINE HCL ER 28 MG PO CP24: ORAL_CAPSULE | ORAL | Status: DC

## 2014-02-08 NOTE — Telephone Encounter (Signed)
Left message on voicemail for patient to return call when available , message will be closed after today if no return call

## 2014-02-08 NOTE — Telephone Encounter (Signed)
Patient daughter, [name] Notified and faxed Rx into [facility].

## 2014-03-08 ENCOUNTER — Encounter: Payer: Self-pay | Admitting: Nurse Practitioner

## 2014-03-08 ENCOUNTER — Ambulatory Visit (INDEPENDENT_AMBULATORY_CARE_PROVIDER_SITE_OTHER): Payer: Medicare Other | Admitting: Nurse Practitioner

## 2014-03-08 ENCOUNTER — Ambulatory Visit
Admission: RE | Admit: 2014-03-08 | Discharge: 2014-03-08 | Disposition: A | Discharge status: Home / Self Care | Payer: Medicare Other | Source: Ambulatory Visit | Attending: Nurse Practitioner | Admitting: Nurse Practitioner

## 2014-03-08 VITALS — BP 128/82 | HR 96 | Temp 98.8°F | Resp 10 | Wt 241.0 lb

## 2014-03-08 DIAGNOSIS — R05 Cough: Secondary | ICD-10-CM

## 2014-03-08 DIAGNOSIS — R059 Cough, unspecified: Secondary | ICD-10-CM

## 2014-03-08 MED ORDER — ALBUTEROL SULFATE HFA 108 (90 BASE) MCG/ACT IN AERS: 2.0000 | INHALATION_SPRAY | Freq: Four times a day (QID) | RESPIRATORY_TRACT | Status: DC | PRN

## 2014-03-08 NOTE — Patient Instructions (Addendum)
To use delsym 12 hour (or store brand alternative) twice daily as needed for cough Claritin (loratidine) 10 mg by mouth daily for allergies (to help with cough) Increase hydration  Follow up if symptoms do not improve or get worse   Cough, Adult  A cough is a reflex that helps clear your throat and airways. It can help heal the body or may be a reaction to an irritated airway. A cough may only last 2 or 3 weeks (acute) or may last more than 8 weeks (chronic).  CAUSES Acute cough:  Viral or bacterial infections. Chronic cough:  Infections.  Allergies.  Asthma.  Post-nasal drip.  Smoking.  Heartburn or acid reflux.  Some medicines.  Chronic lung problems (COPD).  Cancer. SYMPTOMS   Cough.  Fever.  Chest pain.  Increased breathing rate.  High-pitched whistling sound when breathing (wheezing).  Colored mucus that you cough up (sputum). TREATMENT   A bacterial cough may be treated with antibiotic medicine.  A viral cough must run its course and will not respond to antibiotics.  Your caregiver may recommend other treatments if you have a chronic cough. HOME CARE INSTRUCTIONS   Only take over-the-counter or prescription medicines for pain, discomfort, or fever as directed by your caregiver. Use cough suppressants only as directed by your caregiver.  Use a cold steam vaporizer or humidifier in your bedroom or home to help loosen secretions.  Sleep in a semi-upright position if your cough is worse at night.  Rest as needed.  Stop smoking if you smoke. SEEK IMMEDIATE MEDICAL CARE IF:   You have pus in your sputum.  Your cough starts to worsen.  You cannot control your cough with suppressants and are losing sleep.  You begin coughing up blood.  You have difficulty breathing.  You develop pain which is getting worse or is uncontrolled with medicine.  You have a fever. MAKE SURE YOU:   Understand these instructions.  Will watch your  condition.  Will get help right away if you are not doing well or get worse. Document Released: 05/23/2011 Document Revised: 02/16/2012 Document Reviewed: 05/23/2011 [facility] Patient Information 2014 [place].   Bronchospasm, Adult A bronchospasm is when the tubes that carry air in and out of your lungs (airwarys) spasm or tighten. During a bronchospasm it is hard to breathe. This is because the airways get smaller. A bronchospasm can be triggered by:  Allergies. These may be to animals, pollen, food, or mold.  Infection. This is a common cause of bronchospasm.  Exercise.  Irritants. These include pollution, cigarette smoke, strong odors, aerosol sprays, and paint fumes.  Weather changes.  Stress.  Being emotional. HOME CARE   Always have a plan for getting help. Know when to call your doctor and local emergency services (911 in the [place] Know where you can get emergency care.  Only take medicines as told by your doctor.  If you were prescribed an inhaler or nebulizer machine, ask your doctor how to use it correctly. Always use a spacer with your inhaler if you were given one.  Stay calm during an attack. Try to relax and breathe more slowly.  Control your home environment:  Change your heating and air conditioning filter at least once a month.  Limit your use of fireplaces and wood stoves.  Do not  smoke. Do not  allow smoking in your home.  Avoid perfumes and fragrances.  Get rid of pests (such as roaches and mice) and their droppings.  Throw away plants if you see mold on them.  Keep your house clean and dust free.  Replace carpet with wood, tile, or vinyl flooring. Carpet can trap dander and dust.  Use allergy-proof pillows, mattress covers, and box spring covers.  Wash bed sheets and blankets every week in hot water. Dry them in a dryer.  Use blankets that are made of polyester or cotton.  Wash hands frequently. GET HELP IF:  You have muscle  aches.  You have chest pain.  The thick spit you spit or cough up (sputum) changes from clear or white to yellow, green, gray, or bloody.  The thick spit you spit or cough up gets thicker.  There are problems that may be related to the medicine you are given such as:  A rash.  Itching.  Swelling.  Trouble breathing. GET HELP RIGHT AWAY IF:  You feel you cannot breathe or catch your breath.  You cannot stop coughing.  Your treatment is not helping you breathe better. MAKE SURE YOU:   Understand these instructions.  Will watch your condition.  Will get help right away if you are not doing well or get worse. Document Released: 09/21/2009 Document Revised: 07/27/2013 Document Reviewed: 05/17/2013 [facility] Patient Information 2014 [place].

## 2014-03-08 NOTE — Progress Notes (Signed)
Patient ID: [name], female   DOB: Oct 31, 1946, 68 y.o.   MRN: [id number]    No Known Allergies  Chief Complaint  Patient presents with  . Cough    Patient c/o coughing x 1 week (non-productive) and wheezing since yesterday     HPI: Patient is a 68 y.o. female seen in the office today for cough and wheezing yesterday in her throat; daughter here with pt and providing most of the history. Cough has been ongoing for over a week, nonproductive. No fevers or chills, no shortness of breath, no congestion in nares or chest, not effecting sleep. No hx of asthma or COPD, hx of smoking for 40-50 years 2 packs a day.  Does have seasonal allergies  Has not taken anything for the cough.   Review of Systems:  Review of Systems  Constitutional: Negative for fever, chills and malaise/fatigue.  Respiratory: Positive for cough and wheezing (at times). Negative for sputum production and shortness of breath.   Cardiovascular: Negative for chest pain and leg swelling.  Musculoskeletal: Negative for myalgias.  Skin: Negative for itching and rash.  Neurological: Negative for weakness.  Psychiatric/Behavioral: Positive for memory loss.     Past Medical History  Diagnosis Date  . Hypertension   . Stroke   . Memory loss   . Other abnormal blood chemistry   . Unspecified vitamin D deficiency   . Unspecified late effects of cerebrovascular disease   . Other specified disease of nail   . Insomnia, unspecified   . Chronic kidney disease   . Unspecified urinary incontinence   . Other specified cardiac dysrhythmias(427.89)   . Syncope and collapse   . Unspecified disorder of kidney and ureter   . Hyperlipidemia   . Vascular dementia, uncomplicated   . Unspecified transient cerebral ischemia    History reviewed. No pertinent past surgical history. Social History:   reports that she has quit smoking. She does not have any smokeless tobacco history on file. She reports that she does not drink alcohol  or use illicit drugs.  Family History  Problem Relation Age of Onset  . Pneumonia Mother   . Kidney disease Sister   . Hypertension Brother     Medications: Patient's Medications  New Prescriptions   No medications on file  Previous Medications   AMLODIPINE (NORVASC) 10 MG TABLET    Take one tablet by mouth once daily for blood pressure   DONEPEZIL (ARICEPT) 10 MG TABLET    Take one tablet by mouth once daily at bedtime to preserve memory   HYDRALAZINE (APRESOLINE) 25 MG TABLET    Take one tablet by mouth three times daily with meals to control blood pressure   LOSARTAN (COZAAR) 50 MG TABLET    Take one tablet by mouth once daily for blood pressure   MEMANTINE HCL ER (NAMENDA XR) 28 MG CP24    Take one tablet by mouth once daily to preserve memory   OXYBUTYNIN (DITROPAN) 5 MG TABLET    Take one tablet by mouth once daily for bladder control   SIMVASTATIN (ZOCOR) 20 MG TABLET    Take one tablet by mouth once daily to lower cholesterol  Modified Medications   No medications on file  Discontinued Medications   No medications on file     Physical Exam:  Filed Vitals:   03/08/14 1051  BP: 128/82  Pulse: 96  Temp: 98.8 F (37.1 C)  TempSrc: Oral  Resp: 10  Weight: 241 lb (109.317 kg)  SpO2: 97%    Physical Exam  Constitutional: She is well-developed, well-nourished, and in no distress.  HENT:  Head: Normocephalic and atraumatic.  Mouth/Throat: Oropharynx is clear and moist. No oropharyngeal exudate.  Eyes: Conjunctivae and EOM are normal. Pupils are equal, round, and reactive to light.  Neck: Normal range of motion. Neck supple. No thyromegaly present.  Cardiovascular: Normal rate, regular rhythm and normal heart sounds.   Pulmonary/Chest: Effort normal. No respiratory distress. She has wheezes (faint wheezing on right with expiration otherwise normal breath sounds).  Abdominal: Soft. Bowel sounds are normal. She exhibits no distension.  Musculoskeletal: She exhibits no  edema.  Lymphadenopathy:    She has no cervical adenopathy.  Neurological: She is alert.  Skin: Skin is warm and dry. She is not diaphoretic.     Labs reviewed: Basic Metabolic Panel:  Recent Labs  04/25/13 1539  NA 143  K 4.5  CL 102  CO2 21  GLUCOSE 100*  BUN 20  CREATININE 1.39*  CALCIUM 10.4*   Liver Function Tests:  Recent Labs  04/25/13 1539  AST 20  ALT 16  ALKPHOS 105  BILITOT 0.4  PROT 7.8   No results found for this basename: LIPASE, AMYLASE,  in the last 8760 hours No results found for this basename: AMMONIA,  in the last 8760 hours CBC:  Recent Labs  04/25/13 1539  WBC 8.4  NEUTROABS 4.1  HGB 14.7  HCT 45.1  MCV 86   Lipid Panel:  Recent Labs  10/13/13 0958  HDL 79  LDLCALC 76  TRIG 49  CHOLHDL 2.1   Assessment/Plan 1. Cough -without congestion; faint wheezing on exam, will get chest xray; with hx of smoking most like bronchial changes r/t COPD; could also be related to seasonal allergies -to start on Claritin 10 mg daily  - albuterol (PROVENTIL HFA;VENTOLIN HFA) 108 (90 BASE) MCG/ACT inhaler; Inhale 2 puffs into the lungs every 6 (six) hours as needed for wheezing or shortness of breath. Given with instructions -can take delsym twice daily as needed for cough  - DG Chest 1 View today  - to keep follow up as scheduled; follow up sooner if needed for worsening symptoms

## 2014-03-09 ENCOUNTER — Other Ambulatory Visit: Payer: Self-pay | Admitting: Nurse Practitioner

## 2014-03-09 DIAGNOSIS — R05 Cough: Secondary | ICD-10-CM

## 2014-03-09 DIAGNOSIS — R059 Cough, unspecified: Secondary | ICD-10-CM

## 2014-03-14 ENCOUNTER — Other Ambulatory Visit: Payer: Medicare Other

## 2014-03-16 ENCOUNTER — Ambulatory Visit
Admission: RE | Admit: 2014-03-16 | Discharge: 2014-03-16 | Disposition: A | Discharge status: Home / Self Care | Payer: Medicare Other | Source: Ambulatory Visit | Attending: Nurse Practitioner | Admitting: Nurse Practitioner

## 2014-03-16 DIAGNOSIS — R05 Cough: Secondary | ICD-10-CM

## 2014-03-16 DIAGNOSIS — R059 Cough, unspecified: Secondary | ICD-10-CM

## 2014-03-16 MED ORDER — IOHEXOL 300 MG/ML  SOLN
50.0000 mL | Freq: Once | INTRAMUSCULAR | Status: AC | PRN
Start: 2014-03-16 — End: 2014-03-16
  Administered 2014-03-16: 50 mL via INTRAVENOUS

## 2014-04-17 ENCOUNTER — Ambulatory Visit: Payer: Medicare Other | Admitting: Internal Medicine

## 2014-04-25 IMAGING — CR DG CHEST 1V
1 series · 1 of 1 positions shown · non-contrast
Comparison: Chest x-ray of 02/22/2008

CLINICAL DATA: Cough, wheezing, former smoking history

EXAM:
CHEST - 1 VIEW

[w chest pa]
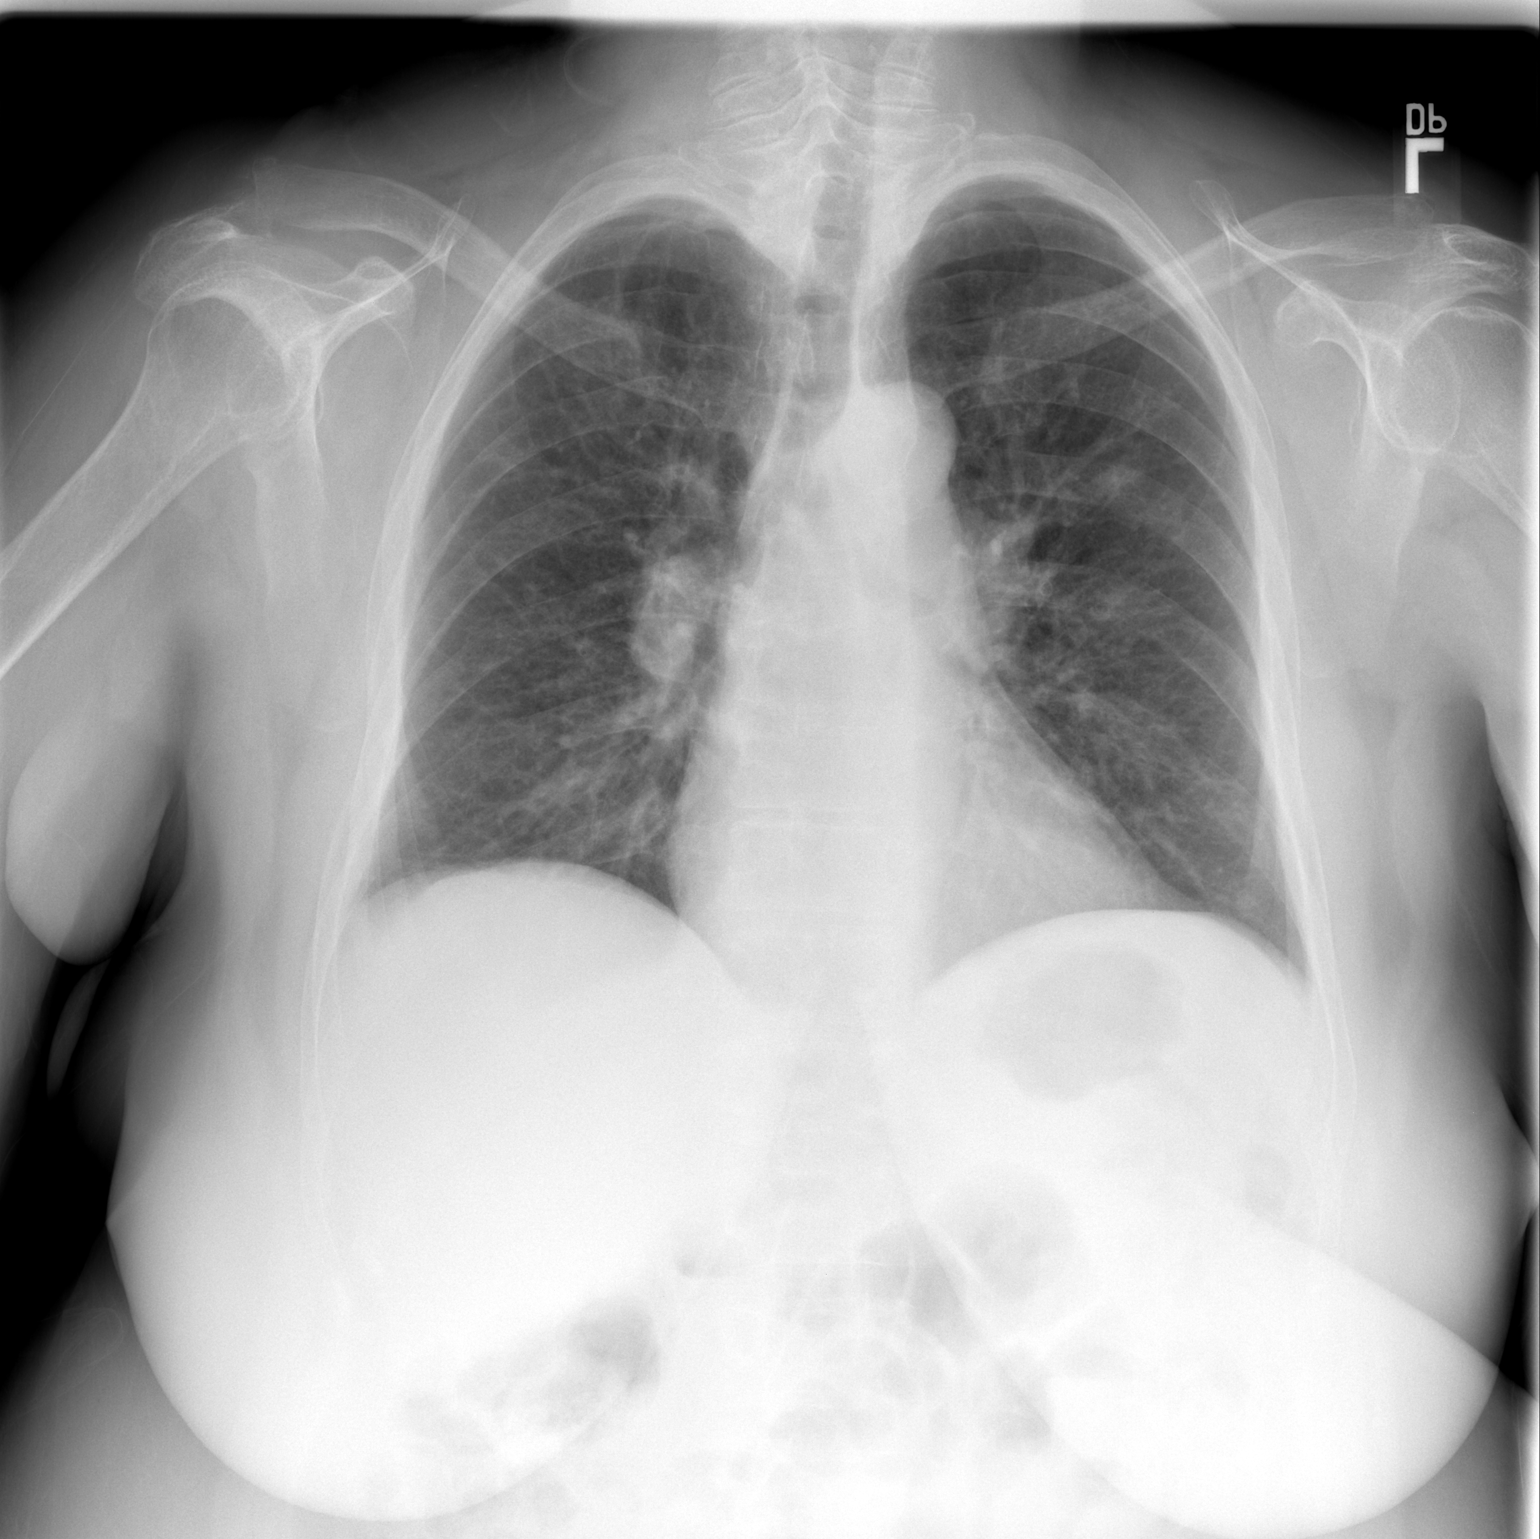

[1 of 1 positions shown; findings below may reference images not displayed]

FINDINGS: No infiltrate or effusion is seen. However, there is a vague nodular
opacity within the left mid upper lung field overlying the anterior
left second rib. This could be bony in origin, but a lung nodule
cannot be excluded. In view of the patient's smoking history, CT of
the chest is recommended with IV contrast media. Mediastinal
contours are unremarkable and the heart is within upper limits of
normal. No acute bony abnormality is seen.
IMPRESSION: 1. Vague nodular opacity in the left mid upper lung field. Cannot
exclude lung neoplasm. Recommend CT the chest with IV contrast
media.
2. No infiltrate or effusion.

## 2014-05-15 ENCOUNTER — Ambulatory Visit: Payer: Medicare Other | Admitting: Internal Medicine

## 2014-06-30 ENCOUNTER — Ambulatory Visit: Payer: Medicare Other | Admitting: Internal Medicine

## 2014-08-04 ENCOUNTER — Encounter: Payer: Self-pay | Admitting: Internal Medicine

## 2014-08-04 ENCOUNTER — Ambulatory Visit (INDEPENDENT_AMBULATORY_CARE_PROVIDER_SITE_OTHER): Payer: Medicare Other | Admitting: Internal Medicine

## 2014-08-04 VITALS — BP 134/82 | HR 76 | Temp 97.6°F | Resp 10 | Wt 236.0 lb

## 2014-08-04 DIAGNOSIS — E785 Hyperlipidemia, unspecified: Secondary | ICD-10-CM

## 2014-08-04 DIAGNOSIS — N182 Chronic kidney disease, stage 2 (mild): Secondary | ICD-10-CM

## 2014-08-04 DIAGNOSIS — R739 Hyperglycemia, unspecified: Secondary | ICD-10-CM

## 2014-08-04 DIAGNOSIS — M899 Disorder of bone, unspecified: Secondary | ICD-10-CM

## 2014-08-04 DIAGNOSIS — Z23 Encounter for immunization: Secondary | ICD-10-CM

## 2014-08-04 DIAGNOSIS — Z1231 Encounter for screening mammogram for malignant neoplasm of breast: Secondary | ICD-10-CM

## 2014-08-04 DIAGNOSIS — I10 Essential (primary) hypertension: Secondary | ICD-10-CM

## 2014-08-04 DIAGNOSIS — M8580 Other specified disorders of bone density and structure, unspecified site: Secondary | ICD-10-CM

## 2014-08-04 DIAGNOSIS — Z1211 Encounter for screening for malignant neoplasm of colon: Secondary | ICD-10-CM

## 2014-08-04 DIAGNOSIS — F0150 Vascular dementia without behavioral disturbance: Secondary | ICD-10-CM

## 2014-08-04 DIAGNOSIS — N3941 Urge incontinence: Secondary | ICD-10-CM

## 2014-08-04 DIAGNOSIS — R7309 Other abnormal glucose: Secondary | ICD-10-CM

## 2014-08-04 DIAGNOSIS — M949 Disorder of cartilage, unspecified: Secondary | ICD-10-CM

## 2014-08-04 MED ORDER — ZOSTER VACCINE LIVE 19400 UNT/0.65ML ~~LOC~~ SOLR: 0.6500 mL | Freq: Once | SUBCUTANEOUS | Status: DC

## 2014-08-04 NOTE — Progress Notes (Signed)
Patient ID: [name], female   DOB: [birth year], 68 y.o.   MRN: [id number]   Location:  [facility]  Code Status: DNR  No Known Allergies  Chief Complaint  Patient presents with  . Medical Management of Chronic Issues    6 month follow-up     HPI: Patient is a 68 y.o. with h/o vascular dementia, htn, hyperlipidemia, and CKD seen in the office today for med mgt of chronic diseases.  She is here with her daughter.    Pt says nothing is new.  She feels fine.  No pain.  Sleeping well.  Eating well.  Mood is all right.  Bowels are moving ok.  No recent incontinence of urine.  Memory is about the same. Has not had to use her inhaler--didn't know where it was and has not needed it.  Review of Systems:  Review of Systems  Constitutional: Negative for fever, chills and malaise/fatigue.  HENT: Negative for congestion.   Eyes: Negative for blurred vision.  Respiratory: Negative for cough, shortness of breath and wheezing.   Cardiovascular: Negative for chest pain and leg swelling.  Gastrointestinal: Negative for abdominal pain, constipation, blood in stool and melena.  Genitourinary: Positive for urgency. Negative for dysuria and frequency.  Musculoskeletal: Negative for falls and myalgias.  Neurological: Negative for dizziness, loss of consciousness and weakness.  Endo/Heme/Allergies: Does not bruise/bleed easily.  Psychiatric/Behavioral: Positive for memory loss. Negative for depression.    Past Medical History  Diagnosis Date  . Hypertension   . Stroke   . Memory loss   . Other abnormal blood chemistry   . Unspecified vitamin D deficiency   . Unspecified late effects of cerebrovascular disease   . Other specified disease of nail   . Insomnia, unspecified   . Chronic kidney disease   . Unspecified urinary incontinence   . Other specified cardiac dysrhythmias(427.89)   . Syncope and collapse   . Unspecified disorder of kidney and ureter    . Hyperlipidemia   . Vascular dementia, uncomplicated   . Unspecified transient cerebral ischemia     History reviewed. No pertinent past surgical history.  Social History:   reports that she has quit smoking. She does not have any smokeless tobacco history on file. She reports that she does not drink alcohol or use illicit drugs.  Family History  Problem Relation Age of Onset  . Pneumonia Mother   . Kidney disease Sister   . Hypertension Brother     Medications: Patient's Medications  New Prescriptions   No medications on file  Previous Medications   ALBUTEROL (PROVENTIL HFA;VENTOLIN HFA) 108 (90 BASE) MCG/ACT INHALER    Inhale 2 puffs into the lungs every 6 (six) hours as needed for wheezing or shortness of breath.   AMLODIPINE (NORVASC) 10 MG TABLET    Take one tablet by mouth once daily for blood pressure   HYDRALAZINE (APRESOLINE) 25 MG TABLET    Take one tablet by mouth three times daily with meals to control blood pressure   LOSARTAN (COZAAR) 50 MG TABLET    Take one tablet by mouth once daily for blood pressure   MEMANTINE HCL-DONEPEZIL HCL (NAMZARIC) 28-10 MG CP24    Take by mouth daily.   OXYBUTYNIN (DITROPAN) 5 MG TABLET    Take one tablet by mouth once daily for bladder control   SIMVASTATIN (ZOCOR) 20 MG TABLET    Take one tablet by mouth once daily to lower cholesterol  Modified Medications   No medications on file  Discontinued Medications   DONEPEZIL (ARICEPT) 10 MG TABLET    Take one tablet by mouth once daily at bedtime to preserve memory   MEMANTINE HCL ER (NAMENDA XR) 28 MG CP24    Take one tablet by mouth once daily to preserve memory     Physical Exam: Filed Vitals:   08/04/14 0937  BP: 134/82  Pulse: 76  Temp: 97.6 F (36.4 C)  TempSrc: Oral  Resp: 10  Weight: 236 lb (107.049 kg)  SpO2: 98%  Physical Exam  Constitutional: She is oriented to person, place, and time. No distress.  Obese black female  HENT:  Head: Normocephalic and  atraumatic.  Cardiovascular: Normal rate, regular rhythm, normal heart sounds and intact distal pulses.   Pulmonary/Chest: Effort normal and breath sounds normal. No respiratory distress.  Abdominal: Soft. Bowel sounds are normal. She exhibits no distension and no mass. There is no tenderness.  Musculoskeletal: Normal range of motion. She exhibits no edema and no tenderness.  Neurological: She is alert and oriented to person, place, and time.  Skin: Skin is warm and dry.  Psychiatric: She has a normal mood and affect.    Labs reviewed: Lipid Panel:  Recent Labs  10/13/13 0958  HDL 79  LDLCALC 76  TRIG 49  CHOLHDL 2.1   Lab Results  Component Value Date   HGBA1C 5.6 04/25/2013   Assessment/Plan 1. Urge urinary incontinence -continues on ditropan per daughter's request with some benefit  2. Essential hypertension -bp is at goal with current cozaar, norvasc, hydralazine  3. Chronic kidney disease, stage 2 (mild) -cont current bp regimen, f/u cmp, avoid nsaids  4. Hyperlipidemia -last lipids were at goal and pt not fasting today so cannot reassess - CBC With differential/Platelet - Comprehensive metabolic panel - Hemoglobin A1c  5. Vascular dementia, uncomplicated -memory said to be stable per her daughter -will reassess mmse next time  6. Hyperglycemia - remains obese - f/u labs - Hemoglobin A1c  7. Other screening mammogram -is overdue so ordered - MM DIGITAL SCREENING BILATERAL; Future  8. Need for vaccination with 13-polyvalent pneumococcal conjugate vaccine -prevnar given today  9. Screening for colon cancer -given cologuard information and form was completed and faxed for testing -will await results  10. Osteopenia - on last bone density 3 years ago, appears she is not taking the ca and D that has previously been recommended -will f/u test - DG Bone Density; Future  11. Need for zoster vaccination -Rx provided to get at pharmacy - zoster vaccine  live, PF, (ZOSTAVAX) 77939 UNT/0.65ML injection; Inject 19,400 Units into the skin once.  Dispense: 1 each; Refill: 0  Labs/tests ordered: Orders Placed This Encounter  Procedures  . DG Bone Density    Standing Status: Future     Number of Occurrences:      Standing Expiration Date: 10/05/2015    Order Specific Question:  Reason for Exam (SYMPTOM  OR DIAGNOSIS REQUIRED)    Answer:  osteopenia    Order Specific Question:  Preferred imaging location?    Answer:  [facility]  . MM DIGITAL SCREENING BILATERAL    Standing Status: Future     Number of Occurrences:      Standing Expiration Date: 10/05/2015    Order Specific Question:  Reason for Exam (SYMPTOM  OR DIAGNOSIS REQUIRED)    Answer:  screening    Order Specific Question:  Preferred imaging location?    Answer:  [facility]  . CBC With differential/Platelet  . Comprehensive metabolic panel  . Hemoglobin A1c    Next appt:  6 mos for annual exam and mmse

## 2014-08-04 NOTE — Patient Instructions (Signed)
Please bring a copy of the HCPOA paperwork so we can scan it into the chart.

## 2014-08-05 LAB — CBC WITH DIFFERENTIAL
Basophils Absolute: 0 10*3/uL (ref 0.0–0.2)
Basos: 0 %
Eos: 1 %
Eosinophils Absolute: 0.1 10*3/uL (ref 0.0–0.4)
HCT: 42.6 % (ref 34.0–46.6)
Hemoglobin: 14 g/dL (ref 11.1–15.9)
Immature Grans (Abs): 0 10*3/uL (ref 0.0–0.1)
Immature Granulocytes: 0 %
Lymphocytes Absolute: 3.9 10*3/uL — ABNORMAL HIGH (ref 0.7–3.1)
Lymphs: 46 %
MCH: 27.9 pg (ref 26.6–33.0)
MCHC: 32.9 g/dL (ref 31.5–35.7)
MCV: 85 fL (ref 79–97)
Monocytes Absolute: 0.8 10*3/uL (ref 0.1–0.9)
Monocytes: 10 %
Neutrophils Absolute: 3.6 10*3/uL (ref 1.4–7.0)
Neutrophils Relative %: 43 %
Platelets: 253 10*3/uL (ref 150–379)
RBC: 5.01 x10E6/uL (ref 3.77–5.28)
RDW: 13.7 % (ref 12.3–15.4)
WBC: 8.4 10*3/uL (ref 3.4–10.8)

## 2014-08-05 LAB — COMPREHENSIVE METABOLIC PANEL
ALT: 13 IU/L (ref 0–32)
AST: 18 IU/L (ref 0–40)
Albumin/Globulin Ratio: 1.4 (ref 1.1–2.5)
Albumin: 4.4 g/dL (ref 3.5–4.8)
Alkaline Phosphatase: 100 IU/L (ref 39–117)
BUN/Creatinine Ratio: 11 (ref 11–26)
BUN: 15 mg/dL (ref 8–27)
CO2: 20 mmol/L (ref 18–29)
Calcium: 9.8 mg/dL (ref 8.7–10.3)
Chloride: 102 mmol/L (ref 97–108)
Creatinine, Ser: 1.34 mg/dL — ABNORMAL HIGH (ref 0.57–1.00)
GFR calc Af Amer: 46 mL/min/{1.73_m2} — ABNORMAL LOW (ref >59)
GFR calc non Af Amer: 40 mL/min/{1.73_m2} — ABNORMAL LOW (ref >59)
Globulin, Total: 3.2 g/dL (ref 1.5–4.5)
Glucose: 100 mg/dL — ABNORMAL HIGH (ref 65–99)
Potassium: 4.5 mmol/L (ref 3.5–5.2)
Sodium: 142 mmol/L (ref 134–144)
Total Bilirubin: 0.5 mg/dL (ref 0.0–1.2)
Total Protein: 7.6 g/dL (ref 6.0–8.5)

## 2014-08-05 LAB — HEMOGLOBIN A1C
Est. average glucose Bld gHb Est-mCnc: 114 mg/dL
Hgb A1c MFr Bld: 5.6 % (ref 4.8–5.6)

## 2014-08-07 ENCOUNTER — Encounter: Payer: Self-pay | Admitting: *Deleted

## 2014-08-25 ENCOUNTER — Telehealth: Payer: Self-pay | Admitting: *Deleted

## 2014-08-25 NOTE — Telephone Encounter (Signed)
Namzaric is denied because the use is not supported by the Food and Drug Administration (FDA) or by one of the Medicare approved references for treating your medical conditions: Memory Loss. Determination given to Dr. Mariea Clonts.

## 2014-10-27 ENCOUNTER — Telehealth: Payer: Self-pay | Admitting: *Deleted

## 2014-10-27 NOTE — Telephone Encounter (Signed)
Called pt. She was not aware of my question's on cologuard, will call back later when I can talk to her daughter.

## 2014-12-28 ENCOUNTER — Other Ambulatory Visit: Payer: Self-pay | Admitting: *Deleted

## 2014-12-28 MED ORDER — MEMANTINE HCL-DONEPEZIL HCL ER 28-10 MG PO CP24: 1.0000 | ORAL_CAPSULE | Freq: Every day | ORAL | Status: DC

## 2014-12-28 MED ORDER — AMLODIPINE BESYLATE 10 MG PO TABS: ORAL_TABLET | ORAL | Status: DC

## 2014-12-28 MED ORDER — OXYBUTYNIN CHLORIDE 5 MG PO TABS: ORAL_TABLET | ORAL | Status: DC

## 2014-12-28 MED ORDER — LOSARTAN POTASSIUM 50 MG PO TABS: ORAL_TABLET | ORAL | Status: DC

## 2014-12-28 MED ORDER — SIMVASTATIN 20 MG PO TABS: ORAL_TABLET | ORAL | Status: DC

## 2014-12-28 MED ORDER — HYDRALAZINE HCL 25 MG PO TABS: ORAL_TABLET | ORAL | Status: DC

## 2014-12-28 MED ORDER — DONEPEZIL HCL 10 MG PO TABS: ORAL_TABLET | ORAL | Status: DC

## 2014-12-28 NOTE — Telephone Encounter (Signed)
[facility] 

## 2015-01-02 ENCOUNTER — Other Ambulatory Visit: Payer: Self-pay | Admitting: *Deleted

## 2015-01-02 MED ORDER — MEMANTINE HCL-DONEPEZIL HCL ER 28-10 MG PO CP24: 1.0000 | ORAL_CAPSULE | Freq: Every day | ORAL | Status: DC

## 2015-01-02 MED ORDER — DONEPEZIL HCL 10 MG PO TABS: ORAL_TABLET | ORAL | Status: DC

## 2015-01-02 MED ORDER — OXYBUTYNIN CHLORIDE 5 MG PO TABS: ORAL_TABLET | ORAL | Status: DC

## 2015-01-02 MED ORDER — AMLODIPINE BESYLATE 10 MG PO TABS: ORAL_TABLET | ORAL | Status: DC

## 2015-01-02 MED ORDER — LOSARTAN POTASSIUM 50 MG PO TABS: ORAL_TABLET | ORAL | Status: DC

## 2015-01-02 MED ORDER — SIMVASTATIN 20 MG PO TABS
ORAL_TABLET | ORAL | Status: DC
Start: 2015-01-02 — End: 2015-01-16

## 2015-01-02 MED ORDER — HYDRALAZINE HCL 25 MG PO TABS: ORAL_TABLET | ORAL | Status: DC

## 2015-01-02 NOTE — Telephone Encounter (Signed)
[facility]

## 2015-01-04 ENCOUNTER — Telehealth: Payer: Self-pay

## 2015-01-04 NOTE — Telephone Encounter (Signed)
[facility] [phone] for authorization on Namzaric. Patient received #30 on 01/01/15, next refill date 01/21/15. This does not require authorization Ref [id number]. Will fax confirmation letter.

## 2015-01-05 NOTE — Telephone Encounter (Signed)
Received confirmation letter from [facility] for Namzaric. Prior Authorization not required. The requested drug is available to the member at the contracted copayment and no authorization is required when the quantity prescribed is within the maximum dispensing limit. [phone].  Member ID #: [id number]

## 2015-01-15 ENCOUNTER — Other Ambulatory Visit: Payer: Self-pay | Admitting: *Deleted

## 2015-01-15 MED ORDER — OXYBUTYNIN CHLORIDE 5 MG PO TABS: ORAL_TABLET | ORAL | Status: DC

## 2015-01-15 MED ORDER — AMLODIPINE BESYLATE 10 MG PO TABS: ORAL_TABLET | ORAL | Status: DC

## 2015-01-15 MED ORDER — HYDRALAZINE HCL 25 MG PO TABS: ORAL_TABLET | ORAL | Status: DC

## 2015-01-15 MED ORDER — MEMANTINE HCL-DONEPEZIL HCL ER 28-10 MG PO CP24: 1.0000 | ORAL_CAPSULE | Freq: Every day | ORAL | Status: DC

## 2015-01-15 NOTE — Telephone Encounter (Signed)
[facility] 

## 2015-01-16 ENCOUNTER — Other Ambulatory Visit: Payer: Self-pay | Admitting: *Deleted

## 2015-01-16 MED ORDER — DONEPEZIL HCL 10 MG PO TABS
ORAL_TABLET | ORAL | Status: DC
Start: 2015-01-16 — End: 2015-03-12

## 2015-01-16 MED ORDER — SIMVASTATIN 20 MG PO TABS: ORAL_TABLET | ORAL | Status: DC

## 2015-01-16 MED ORDER — LOSARTAN POTASSIUM 50 MG PO TABS: ORAL_TABLET | ORAL | Status: DC

## 2015-01-16 NOTE — Telephone Encounter (Signed)
[facility] 

## 2015-02-08 ENCOUNTER — Encounter: Payer: Medicare Other | Admitting: Internal Medicine

## 2015-03-12 ENCOUNTER — Other Ambulatory Visit: Payer: Self-pay | Admitting: *Deleted

## 2015-03-12 ENCOUNTER — Telehealth: Payer: Self-pay | Admitting: *Deleted

## 2015-03-12 NOTE — Telephone Encounter (Signed)
Caregiver wanted to make sure patient was not suppose to be on Aricept along with Namzaric. Informed her that the Namzaric replaces the Aricept. She understood and will call the mail order company to stop sending the Aricept.

## 2015-04-25 ENCOUNTER — Other Ambulatory Visit: Payer: Self-pay

## 2015-04-25 DIAGNOSIS — Z1231 Encounter for screening mammogram for malignant neoplasm of breast: Secondary | ICD-10-CM

## 2015-05-24 ENCOUNTER — Encounter: Payer: Self-pay | Admitting: Internal Medicine

## 2015-06-06 ENCOUNTER — Other Ambulatory Visit: Payer: Self-pay

## 2015-06-06 DIAGNOSIS — H409 Unspecified glaucoma: Secondary | ICD-10-CM | POA: Diagnosis not present

## 2015-06-06 DIAGNOSIS — H35033 Hypertensive retinopathy, bilateral: Secondary | ICD-10-CM | POA: Diagnosis not present

## 2015-06-06 DIAGNOSIS — H2513 Age-related nuclear cataract, bilateral: Secondary | ICD-10-CM | POA: Diagnosis not present

## 2015-06-06 DIAGNOSIS — H401290 Low-tension glaucoma, unspecified eye, stage unspecified: Secondary | ICD-10-CM | POA: Diagnosis not present

## 2015-06-06 DIAGNOSIS — H3531 Nonexudative age-related macular degeneration: Secondary | ICD-10-CM | POA: Diagnosis not present

## 2015-06-06 DIAGNOSIS — H2510 Age-related nuclear cataract, unspecified eye: Secondary | ICD-10-CM

## 2015-06-15 ENCOUNTER — Ambulatory Visit: Payer: Self-pay

## 2015-07-13 ENCOUNTER — Ambulatory Visit
Admission: RE | Admit: 2015-07-13 | Discharge: 2015-07-13 | Disposition: A | Discharge status: Home / Self Care | Payer: Commercial Managed Care - HMO | Source: Ambulatory Visit

## 2015-07-13 DIAGNOSIS — Z1231 Encounter for screening mammogram for malignant neoplasm of breast: Secondary | ICD-10-CM | POA: Diagnosis not present

## 2015-07-18 ENCOUNTER — Encounter: Payer: Self-pay | Admitting: *Deleted

## 2015-07-19 ENCOUNTER — Telehealth: Payer: Self-pay

## 2015-07-19 NOTE — Telephone Encounter (Signed)
Received message on voice mail 07/18/15 at 9:23 (I was at [facility] all day) Returned the called today  [phone] to Denicia Pagliarulo got her voice mail, left the message that I had called to let her know that her mother's mammogram was normal. The office mailed a letter 07/18/15 about this. If any questions, please call back.

## 2015-08-06 ENCOUNTER — Encounter: Payer: Self-pay | Admitting: Internal Medicine

## 2015-08-06 ENCOUNTER — Ambulatory Visit (INDEPENDENT_AMBULATORY_CARE_PROVIDER_SITE_OTHER): Payer: Commercial Managed Care - HMO | Admitting: Internal Medicine

## 2015-08-06 VITALS — BP 128/84 | HR 78 | Temp 98.1°F | Resp 14 | Ht 66.0 in | Wt 246.0 lb

## 2015-08-06 DIAGNOSIS — H6123 Impacted cerumen, bilateral: Secondary | ICD-10-CM

## 2015-08-06 DIAGNOSIS — H918X9 Other specified hearing loss, unspecified ear: Secondary | ICD-10-CM

## 2015-08-06 DIAGNOSIS — Z23 Encounter for immunization: Secondary | ICD-10-CM

## 2015-08-06 DIAGNOSIS — N3941 Urge incontinence: Secondary | ICD-10-CM

## 2015-08-06 DIAGNOSIS — I10 Essential (primary) hypertension: Secondary | ICD-10-CM

## 2015-08-06 DIAGNOSIS — R739 Hyperglycemia, unspecified: Secondary | ICD-10-CM

## 2015-08-06 DIAGNOSIS — H6120 Impacted cerumen, unspecified ear: Secondary | ICD-10-CM

## 2015-08-06 DIAGNOSIS — R011 Cardiac murmur, unspecified: Secondary | ICD-10-CM | POA: Diagnosis not present

## 2015-08-06 DIAGNOSIS — E785 Hyperlipidemia, unspecified: Secondary | ICD-10-CM | POA: Diagnosis not present

## 2015-08-06 DIAGNOSIS — Z0000 Encounter for general adult medical examination without abnormal findings: Secondary | ICD-10-CM

## 2015-08-06 DIAGNOSIS — N182 Chronic kidney disease, stage 2 (mild): Secondary | ICD-10-CM | POA: Diagnosis not present

## 2015-08-06 DIAGNOSIS — F0150 Vascular dementia without behavioral disturbance: Secondary | ICD-10-CM

## 2015-08-06 MED ORDER — ZOSTER VACCINE LIVE 19400 UNT/0.65ML ~~LOC~~ SOLR: 0.6500 mL | Freq: Once | SUBCUTANEOUS | Status: DC

## 2015-08-06 NOTE — Progress Notes (Signed)
Patient ID: [name], female   DOB: 14-Apr-1946, 69 y.o.   MRN: [id number]   Location:  [facility]  Code Status: DNR Goals of Care: Advanced Directive information Does patient have an advance directive?: Yes, Type of Advance Directive: Leon;Living will;Out of facility DNR (pink MOST or yellow form), Pre-existing out of facility DNR order (yellow form or pink MOST form): Yellow form placed in chart (order not valid for inpatient use), Does patient want to make changes to advanced directive?: No - Patient declined   Chief Complaint  Patient presents with  . Annual Exam    Yearly check-up, discuss labs (copy printed), EKG and MMSE (24/30, failed clock drawing)  . Medical Management of Chronic Issues    HTN, High cholesterol, Kidney Diseased, and Dementia     HPI: Patient is a 69 y.o. obese black female seen in the office today for her annual exam and med mgt of chronic diseases.  She is accompanied by her daughter.    Cataracts going on and also had elevated pressure in her eyes and has f/u with Dr. Herbert Deaner about glaucoma eval  MMSE - Mini Mental State Exam 08/06/2015  Orientation to time 5  Orientation to Place 3  Registration 3  Attention/ Calculation 5  Recall 0  Language- name 2 objects 2  Language- repeat 1  Language- follow 3 step command 3  Language- read & follow direction 1  Write a sentence 1  Copy design 0  Total score 24  Memory has been steady.  Short term memory still not the best, but otherwise stable.  On namzaric for memory.    Vision was very poor:  OU 20/70, OS 20/200, 20/200  Function:  Can dress self.  Gets assistance with bathing.  Has caregiver during the day who prepares the meals for her.  Fall Risk  08/06/2015 08/04/2014 04/25/2013  Falls in the past year? Yes No Yes  Number falls in past yr: 2 or more - 1  Injury with Fall? No - -  Risk Factor Category  High Fall Risk - -  Risk for  fall due to : History of fall(s);Impaired balance/gait;Mental status change;Medication side effect;Impaired vision - -  Follow up Falls evaluation completed;Education provided;Falls prevention discussed - -  one fall witnessed by her daughter--tripped on step from house to garage.  No serious injury, but did have a bruise on her right leg.  Other fall happened with bruising around eye--happened on way to restroom.  Depression screen [facility] 2/9 08/06/2015 08/04/2014 04/25/2013  Decreased Interest 0 0 0  Down, Depressed, Hopeless 0 0 0  PHQ - 2 Score 0 0 0  Says she feels all right.  No sad or blue.  Sleeps well at night.   Sometimes feels unsteady.  Not dizzy on standing.  No vertigo.    Urinary incontinence:  Only has episode every once in a blue moon.  Still not drinking enough water.    HTN:  Well controlled with current medications.    CKD:  Improved until 2015.  Need new labs.   Last bone density was totally normal so will consider again at 5 year (2017).  Review of Systems:  Review of Systems  Constitutional: Negative for fever, chills, weight loss and malaise/fatigue.  HENT: Positive for hearing loss. Negative for congestion.        Talks loudly  Eyes: Positive for blurred vision.  Respiratory: Negative for cough and  shortness of breath.   Cardiovascular: Negative for chest pain, palpitations and leg swelling.  Gastrointestinal: Negative for abdominal pain, constipation, blood in stool and melena.  Genitourinary: Positive for urgency. Negative for dysuria and frequency.       Some urge incontinence  Musculoskeletal: Positive for falls. Negative for myalgias, back pain, joint pain and neck pain.  Skin: Negative for rash.  Neurological: Negative for dizziness, tingling, sensory change, speech change, focal weakness, loss of consciousness, weakness and headaches.  Endo/Heme/Allergies: Does not bruise/bleed easily.  Psychiatric/Behavioral: Positive for memory loss. Negative for  depression. The patient is not nervous/anxious and does not have insomnia.     Past Medical History  Diagnosis Date  . Hypertension   . Stroke   . Memory loss   . Other abnormal blood chemistry   . Unspecified vitamin D deficiency   . Unspecified late effects of cerebrovascular disease   . Other specified disease of nail   . Insomnia, unspecified   . Chronic kidney disease   . Unspecified urinary incontinence   . Other specified cardiac dysrhythmias(427.89)   . Syncope and collapse   . Unspecified disorder of kidney and ureter   . Hyperlipidemia   . Vascular dementia, uncomplicated   . Unspecified transient cerebral ischemia     No past surgical history on file.  No Known Allergies Medications: Patient's Medications  New Prescriptions   No medications on file  Previous Medications   ALBUTEROL (PROVENTIL HFA;VENTOLIN HFA) 108 (90 BASE) MCG/ACT INHALER    Inhale 2 puffs into the lungs every 6 (six) hours as needed for wheezing or shortness of breath.   AMLODIPINE (NORVASC) 10 MG TABLET    Take one tablet by mouth once daily for blood pressure   HYDRALAZINE (APRESOLINE) 25 MG TABLET    Take one tablet by mouth three times daily with meals to control blood pressure   LOSARTAN (COZAAR) 50 MG TABLET    Take one tablet by mouth once daily for blood pressure   MEMANTINE HCL-DONEPEZIL HCL (NAMZARIC) 28-10 MG CP24    Take 1 tablet by mouth daily. To preserve memory   OXYBUTYNIN (DITROPAN) 5 MG TABLET    Take one tablet by mouth once daily for bladder control   SIMVASTATIN (ZOCOR) 20 MG TABLET    Take one tablet by mouth once daily to lower cholesterol  Modified Medications   Modified Medication Previous Medication   ZOSTER VACCINE LIVE, PF, (ZOSTAVAX) 48546 UNT/0.65ML INJECTION zoster vaccine live, PF, (ZOSTAVAX) 27035 UNT/0.65ML injection      Inject 19,400 Units into the skin once.    Inject 19,400 Units into the skin once.  Discontinued Medications   No medications on file     Physical Exam: Filed Vitals:   08/06/15 1356  BP: 128/84  Pulse: 78  Temp: 98.1 F (36.7 C)  TempSrc: Oral  Resp: 14  Height: 5\' 6"  (1.676 m)  Weight: 246 lb (111.585 kg)  SpO2: 97%   Physical Exam  Constitutional: She appears well-developed and well-nourished. No distress.  HENT:  Head: Normocephalic and atraumatic.  Right Ear: External ear normal.  Left Ear: External ear normal.  Nose: Nose normal.  Mouth/Throat: Oropharynx is clear and moist. No oropharyngeal exudate.  Bilateral cerumen impaction  Eyes: Conjunctivae and EOM are normal. Pupils are equal, round, and reactive to light.  Cloudy lenses  Neck: Normal range of motion. Neck supple. No JVD present. No tracheal deviation present. No thyromegaly present.  Cardiovascular: Normal rate, regular rhythm and  intact distal pulses.   Murmur heard. Pulmonary/Chest: Effort normal and breath sounds normal. She has no rales. Right breast exhibits no inverted nipple, no mass, no nipple discharge, no skin change and no tenderness. Left breast exhibits no inverted nipple, no mass, no nipple discharge, no skin change and no tenderness.  Abdominal: Soft. Bowel sounds are normal. She exhibits no distension. There is no tenderness.  Musculoskeletal: Normal range of motion. She exhibits no edema or tenderness.  Was unsteady moving from chair to exam table; not using any assistive device  Lymphadenopathy:    She has no cervical adenopathy.  Neurological: She is alert. She exhibits normal muscle tone.  Oriented to person only; could not get reflexes due to adipose tissue over knees  Skin: Skin is warm and dry.  Psychiatric:  Loss of inhibitions    Labs reviewed: labs were all from 1 year ago and need to be repeated  Basic Metabolic Panel: No results for input(s): NA, K, CL, CO2, GLUCOSE, BUN, CREATININE, CALCIUM, MG, PHOS, TSH in the last 8760 hours. Liver Function Tests: No results for input(s): AST, ALT, ALKPHOS, BILITOT,  PROT, ALBUMIN in the last 8760 hours. No results for input(s): LIPASE, AMYLASE in the last 8760 hours. No results for input(s): AMMONIA in the last 8760 hours. CBC: No results for input(s): WBC, NEUTROABS, HGB, HCT, MCV, PLT in the last 8760 hours. Lipid Panel: No results for input(s): CHOL, HDL, LDLCALC, TRIG, CHOLHDL, LDLDIRECT in the last 8760 hours. Lab Results  Component Value Date   HGBA1C 5.6 08/04/2014    Procedures since last visit: Mammogram 07/13/15 EKG today:  NSR with PACs, rate 73; slightly prolonged QTc at 473  Assessment/Plan 1. Routine general medical examination at a health care facility -see hpi for annual exam review -Rx for zostavax given.   -remains obese and inactive  2. Essential hypertension -bp is well controlled with cozaar, norvasc so continue these - EKG 12-Lead  3. Hyperlipemia -last lipid panel 2014 with LDL 76 and HDL 79, reassess asap when fasting - EKG 12-Lead - Lipid panel; Future  4. Chronic kidney disease, stage 2 (mild) -f/u labs--cr had been improving - CBC with Differential/Platelet; Future - Comprehensive metabolic panel; Future  5. Vascular dementia, uncomplicated -cont namzaric daily and caregiver assistance  6. Murmur, cardiac -noted, pt asymptomatic  7. Urge urinary incontinence -continues on ditropan--I have counseled them previously that this increases her fall risk and counteracts her aricept some, but they have chosen to continue it and it is helping her incontinence  8. Hyperglycemia - -remains obese and inactive--regular walking encouraged -f/u Hemoglobin A1c; Future  9. Need for zoster vaccination Rx given to get at pharmacy - zoster vaccine live, PF, (ZOSTAVAX) 32355 UNT/0.65ML injection; Inject 19,400 Units into the skin once.  Dispense: 1 each; Refill: 0  10.  Bilateral cerumen impaction -ears were examined and revealed large volumes of impacted moist cerumen -ears were flushed with warm water and peroxide  using a metal syringe by CMA -I then used a curette to pull out small amounts of remaining cerumen  Labs/tests ordered: Orders Placed This Encounter  Procedures  . CBC with Differential/Platelet    Standing Status: Future     Number of Occurrences:      Standing Expiration Date: 09/06/2015  . Comprehensive metabolic panel    Standing Status: Future     Number of Occurrences:      Standing Expiration Date: 09/06/2015    Order Specific Question:  Has the patient  fasted?    Answer:  Yes  . Hemoglobin A1c    Standing Status: Future     Number of Occurrences:      Standing Expiration Date: 09/06/2015  . Lipid panel    Standing Status: Future     Number of Occurrences:      Standing Expiration Date: 09/06/2015    Order Specific Question:  Has the patient fasted?    Answer:  Yes  . EKG 12-Lead   Next appt: 6 mos med mgt  Mikylah Ackroyd L. Kullen Tomasetti, D.O. [facility] [address], [place] [address] Cell Phone (Mon-Fri 8am-5pm):  [phone] On Call:  [phone] & follow prompts after 5pm & weekends Office Phone:  [phone] Office Fax:  [phone]

## 2015-08-06 NOTE — Progress Notes (Signed)
Failed clock drawing  

## 2015-09-11 ENCOUNTER — Telehealth: Payer: Self-pay

## 2015-09-11 NOTE — Telephone Encounter (Signed)
error 

## 2015-09-11 NOTE — Telephone Encounter (Signed)
-----   Message from [name], DO sent at 09/11/2015  9:11 AM EDT ----- Pt missed her labs.  My last note indicates the labs she was meant to come in for fasting last month.  Please call her daughter and ask her to please bring her as soon as possible for the fasting labs.

## 2015-09-11 NOTE — Telephone Encounter (Signed)
Left message on machine for patients daughter to return call when available

## 2015-10-01 DIAGNOSIS — H401231 Low-tension glaucoma, bilateral, mild stage: Secondary | ICD-10-CM | POA: Diagnosis not present

## 2015-10-01 DIAGNOSIS — H409 Unspecified glaucoma: Secondary | ICD-10-CM | POA: Diagnosis not present

## 2015-10-01 DIAGNOSIS — H401290 Low-tension glaucoma, unspecified eye, stage unspecified: Secondary | ICD-10-CM | POA: Diagnosis not present

## 2015-10-25 ENCOUNTER — Other Ambulatory Visit: Payer: Self-pay | Admitting: Internal Medicine

## 2015-12-07 ENCOUNTER — Other Ambulatory Visit: Payer: Medicare HMO

## 2015-12-07 ENCOUNTER — Ambulatory Visit (INDEPENDENT_AMBULATORY_CARE_PROVIDER_SITE_OTHER): Payer: Commercial Managed Care - HMO

## 2015-12-07 DIAGNOSIS — N182 Chronic kidney disease, stage 2 (mild): Secondary | ICD-10-CM | POA: Diagnosis not present

## 2015-12-07 DIAGNOSIS — Z23 Encounter for immunization: Secondary | ICD-10-CM

## 2015-12-07 DIAGNOSIS — R739 Hyperglycemia, unspecified: Secondary | ICD-10-CM | POA: Diagnosis not present

## 2015-12-07 DIAGNOSIS — E785 Hyperlipidemia, unspecified: Secondary | ICD-10-CM | POA: Diagnosis not present

## 2015-12-07 DIAGNOSIS — I10 Essential (primary) hypertension: Secondary | ICD-10-CM | POA: Diagnosis not present

## 2015-12-08 LAB — COMPREHENSIVE METABOLIC PANEL
ALT: 20 IU/L (ref 0–32)
AST: 20 IU/L (ref 0–40)
Albumin/Globulin Ratio: 1.3 (ref 1.1–2.5)
Albumin: 4 g/dL (ref 3.5–4.8)
Alkaline Phosphatase: 96 IU/L (ref 39–117)
BUN/Creatinine Ratio: 13 (ref 11–26)
BUN: 16 mg/dL (ref 8–27)
Bilirubin Total: 0.6 mg/dL (ref 0.0–1.2)
CO2: 22 mmol/L (ref 18–29)
Calcium: 9.5 mg/dL (ref 8.7–10.3)
Chloride: 103 mmol/L (ref 96–106)
Creatinine, Ser: 1.24 mg/dL — ABNORMAL HIGH (ref 0.57–1.00)
GFR calc Af Amer: 50 mL/min/{1.73_m2} — ABNORMAL LOW (ref >59)
GFR calc non Af Amer: 44 mL/min/{1.73_m2} — ABNORMAL LOW (ref >59)
Globulin, Total: 3.1 g/dL (ref 1.5–4.5)
Glucose: 103 mg/dL — ABNORMAL HIGH (ref 65–99)
Potassium: 4.4 mmol/L (ref 3.5–5.2)
Sodium: 141 mmol/L (ref 134–144)
Total Protein: 7.1 g/dL (ref 6.0–8.5)

## 2015-12-08 LAB — CBC WITH DIFFERENTIAL/PLATELET
Basophils Absolute: 0 10*3/uL (ref 0.0–0.2)
Basos: 0 %
EOS (ABSOLUTE): 0.1 10*3/uL (ref 0.0–0.4)
Eos: 1 %
Hematocrit: 41 % (ref 34.0–46.6)
Hemoglobin: 13.8 g/dL (ref 11.1–15.9)
Immature Grans (Abs): 0 10*3/uL (ref 0.0–0.1)
Immature Granulocytes: 0 %
Lymphocytes Absolute: 4.3 10*3/uL — ABNORMAL HIGH (ref 0.7–3.1)
Lymphs: 51 %
MCH: 27.9 pg (ref 26.6–33.0)
MCHC: 33.7 g/dL (ref 31.5–35.7)
MCV: 83 fL (ref 79–97)
Monocytes Absolute: 0.7 10*3/uL (ref 0.1–0.9)
Monocytes: 8 %
Neutrophils Absolute: 3.3 10*3/uL (ref 1.4–7.0)
Neutrophils: 40 %
Platelets: 276 10*3/uL (ref 150–379)
RBC: 4.95 x10E6/uL (ref 3.77–5.28)
RDW: 13.2 % (ref 12.3–15.4)
WBC: 8.5 10*3/uL (ref 3.4–10.8)

## 2015-12-08 LAB — LIPID PANEL
Chol/HDL Ratio: 2.2 ratio units (ref 0.0–4.4)
Cholesterol, Total: 178 mg/dL (ref 100–199)
HDL: 81 mg/dL (ref >39)
LDL Calculated: 87 mg/dL (ref 0–99)
Triglycerides: 52 mg/dL (ref 0–149)
VLDL Cholesterol Cal: 10 mg/dL (ref 5–40)

## 2015-12-08 LAB — HEMOGLOBIN A1C
Est. average glucose Bld gHb Est-mCnc: 120 mg/dL
Hgb A1c MFr Bld: 5.8 % — ABNORMAL HIGH (ref 4.8–5.6)

## 2015-12-09 ENCOUNTER — Other Ambulatory Visit: Payer: Self-pay | Admitting: Internal Medicine

## 2016-02-01 ENCOUNTER — Telehealth: Payer: Self-pay

## 2016-02-01 DIAGNOSIS — H401231 Low-tension glaucoma, bilateral, mild stage: Secondary | ICD-10-CM | POA: Diagnosis not present

## 2016-02-01 DIAGNOSIS — H1859 Other hereditary corneal dystrophies: Secondary | ICD-10-CM | POA: Diagnosis not present

## 2016-02-01 DIAGNOSIS — H16149 Punctate keratitis, unspecified eye: Secondary | ICD-10-CM | POA: Diagnosis not present

## 2016-02-01 NOTE — Telephone Encounter (Signed)
Patient is currently established with Dr. Herbert Deaner and needs a new referral. I verbally told Alton Revere completed [facility] referral electronically. Authorization number:  [id number] valid until 07/30/16

## 2016-02-01 NOTE — Telephone Encounter (Signed)
Dr.Hecker's office called back and stated that patient will see Dr.Christopher Kathlen Mody. [name] resubmitted the [place] referral and  gave the new authorization number to the receptionist

## 2016-02-04 ENCOUNTER — Other Ambulatory Visit: Payer: Self-pay

## 2016-02-07 ENCOUNTER — Ambulatory Visit: Payer: Commercial Managed Care - HMO | Admitting: Internal Medicine

## 2016-02-09 ENCOUNTER — Other Ambulatory Visit: Payer: Self-pay | Admitting: Internal Medicine

## 2016-03-03 ENCOUNTER — Ambulatory Visit (INDEPENDENT_AMBULATORY_CARE_PROVIDER_SITE_OTHER): Payer: Commercial Managed Care - HMO | Admitting: Internal Medicine

## 2016-03-03 ENCOUNTER — Encounter: Payer: Self-pay | Admitting: Internal Medicine

## 2016-03-03 VITALS — BP 128/70 | HR 117 | Temp 98.3°F | Ht 66.0 in | Wt 250.0 lb

## 2016-03-03 DIAGNOSIS — N3941 Urge incontinence: Secondary | ICD-10-CM

## 2016-03-03 DIAGNOSIS — R739 Hyperglycemia, unspecified: Secondary | ICD-10-CM | POA: Diagnosis not present

## 2016-03-03 DIAGNOSIS — I10 Essential (primary) hypertension: Secondary | ICD-10-CM

## 2016-03-03 DIAGNOSIS — Z1211 Encounter for screening for malignant neoplasm of colon: Secondary | ICD-10-CM | POA: Diagnosis not present

## 2016-03-03 DIAGNOSIS — E785 Hyperlipidemia, unspecified: Secondary | ICD-10-CM

## 2016-03-03 DIAGNOSIS — N182 Chronic kidney disease, stage 2 (mild): Secondary | ICD-10-CM | POA: Diagnosis not present

## 2016-03-03 DIAGNOSIS — F0150 Vascular dementia without behavioral disturbance: Secondary | ICD-10-CM

## 2016-03-03 NOTE — Progress Notes (Signed)
Patient ID: [name], female   DOB: 1946/09/20, 70 y.o.   MRN: [id number]   Location:  [facility] Provider:  Shahana Capes [name], D.O., C.M.D.  Code Status: DNR Goals of Care:  Advanced Directives 03/03/2016  Does patient have an advance directive? Yes  Type of Advance Directive [place]  Does patient want to make changes to advanced directive? -  Copy of advanced directive(s) in chart? Yes  Pre-existing out of facility DNR order (yellow form or pink MOST form) -   Chief Complaint  Patient presents with  . Medical Management of Chronic Issues    6 mth follow-up, daughter jennifer    HPI: Patient is a 70 y.o. female seen today for medical management of chronic diseases.    BP is well controlled.   HR initially elevated today.   She has been to the ophthalmologist.  Dr. Herbert Deaner.  She is getting glasses.    Gained 4 more lbs  Prediabetic:  She had elevated hba1c end of December.  Will work on walking and diet and reassess next visit.    Still takes ditropan for her bladder.  No accidents in a long time.   Past Medical History  Diagnosis Date  . Hypertension   . Stroke ([place])   . Memory loss   . Other abnormal blood chemistry   . Unspecified vitamin D deficiency   . Unspecified late effects of cerebrovascular disease   . Other specified disease of nail   . Insomnia, unspecified   . Chronic kidney disease   . Unspecified urinary incontinence   . Other specified cardiac dysrhythmias(427.89)   . Syncope and collapse   . Unspecified disorder of kidney and ureter   . Hyperlipidemia   . Vascular dementia, uncomplicated   . Unspecified transient cerebral ischemia     History reviewed. No pertinent past surgical history.  No Known Allergies    Medication List       This list is accurate as of: 03/03/16 11:10 AM.  Always use your most recent med list.               albuterol 108 (90 Base) MCG/ACT inhaler  Commonly known as:  PROVENTIL HFA;VENTOLIN  HFA  Inhale 2 puffs into the lungs every 6 (six) hours as needed for wheezing or shortness of breath.     amLODipine 10 MG tablet  Commonly known as:  NORVASC  TAKE ONE TABLET BY MOUTH ONCE DAILY FOR BLOOD PRESSURE     hydrALAZINE 25 MG tablet  Commonly known as:  APRESOLINE  TAKE 1 TABLET THREE TIMES DAILY  WITH  MEALS  TO  CONTROL  BLOOD PRESSURE     losartan 50 MG tablet  Commonly known as:  COZAAR  TAKE ONE TABLET ONCE DAILY FOR BLOOD PRESSURE     NAMZARIC 28-10 MG Cp24  Generic drug:  Memantine HCl-Donepezil HCl  TAKE 1 CAPSULE BY MOUTH DAILY TO PRESERVE MEMORY     oxybutynin 5 MG tablet  Commonly known as:  DITROPAN  TAKE 1 TABLET ONE TIME DAILY  FOR  BLADDER  CONTROL     simvastatin 20 MG tablet  Commonly known as:  ZOCOR  TAKE 1 TABLET ONE TIME DAILY  TO  LOWER  CHOLESTEROL        Review of Systems:  Review of Systems  Constitutional: Negative for fever, chills, weight loss and malaise/fatigue.  HENT: Negative for congestion and hearing loss.   Eyes: Negative for blurred  vision.       Glasses are being made  Respiratory: Negative for shortness of breath.   Cardiovascular: Negative for chest pain, palpitations and leg swelling.  Gastrointestinal: Negative for abdominal pain, constipation, blood in stool and melena.  Genitourinary: Negative for dysuria, urgency and frequency.       Better with ditropan and they're aware of risks of beers med  Musculoskeletal: Negative for myalgias and falls.  Neurological: Negative for dizziness, loss of consciousness, weakness and headaches.  Endo/Heme/Allergies: Does not bruise/bleed easily.  Psychiatric/Behavioral: Positive for memory loss. Negative for depression. The patient is not nervous/anxious and does not have insomnia.     Health Maintenance  Topic Date Due  . Hepatitis C Screening  March 14, 1946  . COLONOSCOPY  04/20/1994  . ZOSTAVAX  04/20/2004  . INFLUENZA VACCINE  07/08/2016  . MAMMOGRAM  07/12/2017  .  TETANUS/TDAP  05/06/2021  . DEXA SCAN  Completed  . PNA vac Low Risk Adult  Completed    Physical Exam: Filed Vitals:   03/03/16 1103  BP: 128/70  Pulse: 117  Temp: 98.3 F (36.8 C)  TempSrc: Oral  Height: 5\' 6"  (1.676 m)  Weight: 250 lb (113.399 kg)  SpO2: 94%   Body mass index is 40.37 kg/(m^2). Physical Exam  Constitutional: She appears well-developed and well-nourished. No distress.  Obese black female  Cardiovascular: Normal rate, regular rhythm, normal heart sounds and intact distal pulses.   Pulmonary/Chest: Effort normal and breath sounds normal. No respiratory distress.  Abdominal: Soft. Bowel sounds are normal. She exhibits no distension and no mass. There is no tenderness.  Musculoskeletal: Normal range of motion.  Neurological: She is alert.  Oriented to person and place, not time  Skin: Skin is warm and dry.  Psychiatric:  Jokes around most of visit    Labs reviewed: Basic Metabolic Panel:  Recent Labs  12/07/15 1100  NA 141  K 4.4  CL 103  CO2 22  GLUCOSE 103*  BUN 16  CREATININE 1.24*  CALCIUM 9.5   Liver Function Tests:  Recent Labs  12/07/15 1100  AST 20  ALT 20  ALKPHOS 96  BILITOT 0.6  PROT 7.1  ALBUMIN 4.0   No results for input(s): LIPASE, AMYLASE in the last 8760 hours. No results for input(s): AMMONIA in the last 8760 hours. CBC:  Recent Labs  12/07/15 1100  WBC 8.5  NEUTROABS 3.3  HCT 41.0  MCV 83  PLT 276   Lipid Panel:  Recent Labs  12/07/15 1100  CHOL 178  HDL 81  LDLCALC 87  TRIG 52  CHOLHDL 2.2   Lab Results  Component Value Date   HGBA1C 5.8* 12/07/2015    Assessment/Plan 1. Essential hypertension -bp at goal with current medications so continue same, but work on diet and exercise program - Comprehensive metabolic panel; Future  2. Hyperlipemia - cont zocor 20mg  with evening meal and recheck lab - Lipid panel; Future  3. Chronic kidney disease, stage 2 (mild) -cont to avoid nephrotoxic  meds, renally adjust as needed - CBC with Differential/Platelet; Future - Comprehensive metabolic panel; Future  4. Vascular dementia, uncomplicated -has been stable w/o new events as far as we know -cont namzaric -I have discussed previously that ditropan could be negatively affecting pt's cognition, but it's been effective for eliminating incontinence   5. Urge urinary incontinence -cont ditropan as it's been effective despite risks  6. Hyperglycemia -again, needs to work on diet and exercise more - Hemoglobin A1c; Future -  CBC with Differential/Platelet; Future - Comprehensive metabolic panel; Future  7.  Colon cancer screening -cologuard forms completed today and faxed for insurance authorization  Labs/tests ordered:   Orders Placed This Encounter  Procedures  . Hemoglobin A1c    Standing Status: Future     Number of Occurrences:      Standing Expiration Date: 03/03/2017  . Lipid panel    Standing Status: Future     Number of Occurrences:      Standing Expiration Date: 03/03/2017    Order Specific Question:  Has the patient fasted?    Answer:  Yes  . CBC with Differential/Platelet    Standing Status: Future     Number of Occurrences:      Standing Expiration Date: 03/03/2017  . Comprehensive metabolic panel    Standing Status: Future     Number of Occurrences:      Standing Expiration Date: 03/03/2017    Order Specific Question:  Has the patient fasted?    Answer:  Yes    Next appt:  6 mos annual with labs before  Horace. Nour Scalise, D.O. [facility] [address], [place] [address] Cell Phone (Mon-Fri 8am-5pm):  [phone] On Call:  [phone] & follow prompts after 5pm & weekends Office Phone:  [phone] Office Fax:  [phone]

## 2016-04-14 ENCOUNTER — Other Ambulatory Visit: Payer: Self-pay | Admitting: Internal Medicine

## 2016-06-17 ENCOUNTER — Other Ambulatory Visit: Payer: Self-pay | Admitting: Internal Medicine

## 2016-07-18 ENCOUNTER — Encounter: Payer: Self-pay | Admitting: Internal Medicine

## 2016-07-25 NOTE — Addendum Note (Signed)
Addended by: Logan Bores on: 07/25/2016 03:37 PM   Modules accepted: Orders

## 2016-07-28 ENCOUNTER — Telehealth: Payer: Self-pay | Admitting: *Deleted

## 2016-07-28 DIAGNOSIS — I10 Essential (primary) hypertension: Secondary | ICD-10-CM

## 2016-07-28 NOTE — Telephone Encounter (Signed)
Patient daughter called and stated that patient has an appointment with Dr. Baldemar Lenis for eye exam on 09/10/16 and needs updated referral. Referral placed.

## 2016-08-25 ENCOUNTER — Other Ambulatory Visit: Payer: Self-pay | Admitting: Internal Medicine

## 2016-09-01 ENCOUNTER — Other Ambulatory Visit: Payer: Commercial Managed Care - HMO

## 2016-09-01 ENCOUNTER — Ambulatory Visit (INDEPENDENT_AMBULATORY_CARE_PROVIDER_SITE_OTHER): Payer: Commercial Managed Care - HMO

## 2016-09-01 DIAGNOSIS — I10 Essential (primary) hypertension: Secondary | ICD-10-CM

## 2016-09-01 DIAGNOSIS — Z23 Encounter for immunization: Secondary | ICD-10-CM | POA: Diagnosis not present

## 2016-09-01 DIAGNOSIS — R739 Hyperglycemia, unspecified: Secondary | ICD-10-CM

## 2016-09-01 DIAGNOSIS — N182 Chronic kidney disease, stage 2 (mild): Secondary | ICD-10-CM

## 2016-09-01 DIAGNOSIS — E785 Hyperlipidemia, unspecified: Secondary | ICD-10-CM

## 2016-09-01 LAB — LIPID PANEL
Cholesterol: 166 mg/dL (ref 125–200)
HDL: 77 mg/dL (ref >=46)
LDL Cholesterol: 81 mg/dL (ref <130)
Total CHOL/HDL Ratio: 2.2 Ratio (ref <=5.0)
Triglycerides: 41 mg/dL (ref <150)
VLDL: 8 mg/dL (ref <30)

## 2016-09-01 LAB — COMPREHENSIVE METABOLIC PANEL
ALT: 12 U/L (ref 6–29)
AST: 15 U/L (ref 10–35)
Albumin: 3.9 g/dL (ref 3.6–5.1)
Alkaline Phosphatase: 82 U/L (ref 33–130)
BUN: 20 mg/dL (ref 7–25)
CO2: 24 mmol/L (ref 20–31)
Calcium: 9.4 mg/dL (ref 8.6–10.4)
Chloride: 107 mmol/L (ref 98–110)
Creat: 1.27 mg/dL — ABNORMAL HIGH (ref 0.60–0.93)
Glucose, Bld: 102 mg/dL — ABNORMAL HIGH (ref 65–99)
Potassium: 3.9 mmol/L (ref 3.5–5.3)
Sodium: 141 mmol/L (ref 135–146)
Total Bilirubin: 0.7 mg/dL (ref 0.2–1.2)
Total Protein: 7 g/dL (ref 6.1–8.1)

## 2016-09-01 LAB — CBC WITH DIFFERENTIAL/PLATELET
Basophils Absolute: 0 cells/uL (ref 0–200)
Basophils Relative: 0 %
Eosinophils Absolute: 90 cells/uL (ref 15–500)
Eosinophils Relative: 1 %
HCT: 40.6 % (ref 35.0–45.0)
Hemoglobin: 13.4 g/dL (ref 11.7–15.5)
Lymphocytes Relative: 48 %
Lymphs Abs: 4320 cells/uL — ABNORMAL HIGH (ref 850–3900)
MCH: 28.3 pg (ref 27.0–33.0)
MCHC: 33 g/dL (ref 32.0–36.0)
MCV: 85.7 fL (ref 80.0–100.0)
MPV: 9.9 fL (ref 7.5–12.5)
Monocytes Absolute: 720 cells/uL (ref 200–950)
Monocytes Relative: 8 %
Neutro Abs: 3870 cells/uL (ref 1500–7800)
Neutrophils Relative %: 43 %
Platelets: 278 10*3/uL (ref 140–400)
RBC: 4.74 MIL/uL (ref 3.80–5.10)
RDW: 13.6 % (ref 11.0–15.0)
WBC: 9 10*3/uL (ref 3.8–10.8)

## 2016-09-02 LAB — HEMOGLOBIN A1C
Hgb A1c MFr Bld: 5.3 % (ref <5.7)
Mean Plasma Glucose: 105 mg/dL

## 2016-09-05 ENCOUNTER — Encounter: Payer: Self-pay | Admitting: Internal Medicine

## 2016-09-10 ENCOUNTER — Encounter: Payer: Self-pay | Admitting: Internal Medicine

## 2016-09-10 DIAGNOSIS — H25013 Cortical age-related cataract, bilateral: Secondary | ICD-10-CM | POA: Diagnosis not present

## 2016-09-10 DIAGNOSIS — H2513 Age-related nuclear cataract, bilateral: Secondary | ICD-10-CM | POA: Diagnosis not present

## 2016-09-10 DIAGNOSIS — H401211 Low-tension glaucoma, right eye, mild stage: Secondary | ICD-10-CM | POA: Diagnosis not present

## 2016-09-10 DIAGNOSIS — H401221 Low-tension glaucoma, left eye, mild stage: Secondary | ICD-10-CM | POA: Diagnosis not present

## 2016-09-11 ENCOUNTER — Encounter: Payer: Self-pay | Admitting: Internal Medicine

## 2016-09-11 ENCOUNTER — Ambulatory Visit (INDEPENDENT_AMBULATORY_CARE_PROVIDER_SITE_OTHER): Payer: Commercial Managed Care - HMO | Admitting: Internal Medicine

## 2016-09-11 VITALS — BP 128/80 | HR 82 | Temp 98.4°F | Ht 66.0 in | Wt 258.0 lb

## 2016-09-11 DIAGNOSIS — H6123 Impacted cerumen, bilateral: Secondary | ICD-10-CM | POA: Diagnosis not present

## 2016-09-11 DIAGNOSIS — F0150 Vascular dementia without behavioral disturbance: Secondary | ICD-10-CM | POA: Diagnosis not present

## 2016-09-11 DIAGNOSIS — R008 Other abnormalities of heart beat: Secondary | ICD-10-CM | POA: Diagnosis not present

## 2016-09-11 DIAGNOSIS — R9431 Abnormal electrocardiogram [ECG] [EKG]: Secondary | ICD-10-CM

## 2016-09-11 DIAGNOSIS — N182 Chronic kidney disease, stage 2 (mild): Secondary | ICD-10-CM

## 2016-09-11 DIAGNOSIS — I10 Essential (primary) hypertension: Secondary | ICD-10-CM | POA: Diagnosis not present

## 2016-09-11 DIAGNOSIS — R739 Hyperglycemia, unspecified: Secondary | ICD-10-CM | POA: Diagnosis not present

## 2016-09-11 DIAGNOSIS — E782 Mixed hyperlipidemia: Secondary | ICD-10-CM | POA: Diagnosis not present

## 2016-09-11 DIAGNOSIS — Z0000 Encounter for general adult medical examination without abnormal findings: Secondary | ICD-10-CM | POA: Diagnosis not present

## 2016-09-11 DIAGNOSIS — N3941 Urge incontinence: Secondary | ICD-10-CM

## 2016-09-11 MED ORDER — SIMVASTATIN 40 MG PO TABS: 40.0000 mg | ORAL_TABLET | Freq: Every day | ORAL | 3 refills | Status: DC

## 2016-09-11 NOTE — Patient Instructions (Addendum)
Fat and Cholesterol Restricted Diet  Getting too much fat and cholesterol in your diet may cause health problems. Following this diet helps keep your fat and cholesterol at normal levels. This can keep you from getting sick.  WHAT TYPES OF FAT SHOULD I CHOOSE?  · Choose monosaturated and polyunsaturated fats. These are found in foods such as olive oil, canola oil, flaxseeds, walnuts, almonds, and seeds.  · Eat more omega-3 fats. Good choices include salmon, mackerel, sardines, tuna, flaxseed oil, and ground flaxseeds.  · Limit saturated fats. These are in animal products such as meats, butter, and cream. They can also be in plant products such as palm oil, palm kernel oil, and coconut oil.  ·  Avoid foods with partially hydrogenated oils in them. These contain trans fats. Examples of foods that have trans fats are stick margarine, some tub margarines, cookies, crackers, and other baked goods.  WHAT GENERAL GUIDELINES DO I NEED TO FOLLOW?   · Check food labels. Look for the words "trans fat" and "saturated fat."  · When preparing a meal:    Fill half of your plate with vegetables and green salads.    Fill one fourth of your plate with whole grains. Look for the word "whole" as the first word in the ingredient list.    Fill one fourth of your plate with lean protein foods.  · Limit fruit to two servings a day. Choose fruit instead of juice.  · Eat more foods with soluble fiber. Examples of foods with this type of fiber are apples, broccoli, carrots, beans, peas, and barley. Try to get 20-30 g (grams) of fiber per day.  · Eat more home-cooked foods. Eat less at restaurants and buffets.  · Limit or avoid alcohol.  · Limit foods high in starch and sugar.  · Limit fried foods.  · Cook foods without frying them. Baking, boiling, grilling, and broiling are all great options.  · Lose weight if you are overweight. Losing even a small amount of weight can help your overall health. It can also help prevent diseases such as  diabetes and heart disease.  WHAT FOODS CAN I EAT?  Grains  Whole grains, such as whole wheat or whole grain breads, crackers, cereals, and pasta. Unsweetened oatmeal, bulgur, barley, quinoa, or brown rice. Corn or whole wheat flour tortillas.  Vegetables  Fresh or frozen vegetables (raw, steamed, roasted, or grilled). Green salads.  Fruits  All fresh, canned (in natural juice), or frozen fruits.  Meat and Other Protein Products  Ground beef (85% or leaner), grass-fed beef, or beef trimmed of fat. Skinless chicken or turkey. Ground chicken or turkey. Pork trimmed of fat. All fish and seafood. Eggs. Dried beans, peas, or lentils. Unsalted nuts or seeds. Unsalted canned or dry beans.  Dairy  Low-fat dairy products, such as skim or 1% milk, 2% or reduced-fat cheeses, low-fat ricotta or cottage cheese, or plain low-fat yogurt.  Fats and Oils  Tub margarines without trans fats. Light or reduced-fat mayonnaise and salad dressings. Avocado. Olive, canola, sesame, or safflower oils. Natural peanut or almond butter (choose ones without added sugar and oil).  The items listed above may not be a complete list of recommended foods or beverages. Contact your dietitian for more options.  WHAT FOODS ARE NOT RECOMMENDED?  Grains  White bread. White pasta. White rice. Cornbread. Bagels, pastries, and croissants. Crackers that contain trans fat.  Vegetables  White potatoes. Corn. Creamed or fried vegetables. Vegetables in a cheese sauce.    Fruits  Dried fruits. Canned fruit in light or heavy syrup. Fruit juice.  Meat and Other Protein Products  Fatty cuts of meat. Ribs, chicken wings, bacon, sausage, bologna, salami, chitterlings, fatback, hot dogs, bratwurst, and packaged luncheon meats. Liver and organ meats.  Dairy  Whole or 2% milk, cream, half-and-half, and cream cheese. Whole milk cheeses. Whole-fat or sweetened yogurt. Full-fat cheeses. Nondairy creamers and whipped toppings. Processed cheese, cheese spreads, or cheese  curds.  Sweets and Desserts  Corn syrup, sugars, honey, and molasses. Candy. Jam and jelly. Syrup. Sweetened cereals. Cookies, pies, cakes, donuts, muffins, and ice cream.  Fats and Oils  Butter, stick margarine, lard, shortening, ghee, or bacon fat. Coconut, palm kernel, or palm oils.  Beverages  Alcohol. Sweetened drinks (such as sodas, lemonade, and fruit drinks or punches).  The items listed above may not be a complete list of foods and beverages to avoid. Contact your dietitian for more information.     This information is not intended to replace advice given to you by your health care provider. Make sure you discuss any questions you have with your health care provider.     Document Released: 05/25/2012 Document Revised: 12/15/2014 Document Reviewed: 02/23/2014  Elsevier Interactive Patient Education ©2016 [employer]

## 2016-09-11 NOTE — Progress Notes (Signed)
Location:  [facility] clinic Provider: Sherriann Szuch [name], D.O., C.M.D.  Patient Care Team: [name], DO as PCP - General (Geriatric Medicine) [name], MD as Consulting Physician (Ophthalmology)  Extended Emergency Contact Information Primary Emergency Contact: [name],[name] Address: [place], [place] [address] [name] of [place] Phone: [phone] Mobile Phone: [phone] Relation: Daughter  Code Status: DNR Goals of Care: Advanced Directive information Advanced Directives 09/11/2016  Does patient have an advance directive? Yes  Type of Advance Directive -  Does patient want to make changes to advanced directive? -  Copy of advanced directive(s) in chart? No - copy requested  Pre-existing out of facility DNR order (yellow form or pink MOST form) -   Chief Complaint  Patient presents with  . Annual Exam    Wellness exam  . MMSE    22/30 would not try clock    HPI: Patient is a 70 y.o. female seen in today for an annual wellness exam.    Depression screen [facility] 2/9 03/03/2016 08/06/2015 08/04/2014 04/25/2013  Decreased Interest 0 0 0 0  Down, Depressed, Hopeless 0 0 0 0  PHQ - 2 Score 0 0 0 0    Fall Risk  03/03/2016 08/06/2015 08/04/2014 04/25/2013  Falls in the past year? No Yes No Yes  Number falls in past yr: - 2 or more - 1  Injury with Fall? - No - -  Risk Factor Category  - High Fall Risk - -  Risk for fall due to : - History of fall(s);Impaired balance/gait;Mental status change;Medication side effect;Impaired vision - -  Follow up - Falls evaluation completed;Education provided;Falls prevention discussed - -   MMSE - Mini Mental State Exam 09/11/2016 08/06/2015  Orientation to time 3 5  Orientation to Place 4 3  Registration 3 3  Attention/ Calculation 5 5  Recall 0 0  Language- name 2 objects 2 2  Language- repeat 1 1  Language- follow 3 step command 3 3  Language- read & follow direction 1 1  Write a sentence 0 1  Copy  design 0 0  Total score 22 24  failed clock (wouldn't do it)  Health Maintenance  Topic Date Due  . Hepatitis C Screening  1946-03-21  . COLONOSCOPY  04/20/1994  . ZOSTAVAX  04/20/2004  . MAMMOGRAM  07/12/2017  . TETANUS/TDAP  05/06/2021  . INFLUENZA VACCINE  Completed  . DEXA SCAN  Completed  . PNA vac Low Risk Adult  Completed   Functional Status Survey: Is the patient deaf or have difficulty hearing?: No Does the patient have difficulty seeing, even when wearing glasses/contacts?: No Does the patient have difficulty concentrating, remembering, or making decisions?: Yes Does the patient have difficulty walking or climbing stairs?: No Does the patient have difficulty dressing or bathing?: Yes Does the patient have difficulty doing errands alone such as visiting a doctor's office or shopping?: Yes Current Exercise Habits: The patient does not participate in regular exercise at present Exercise limited by: None identified  Diet? Vision Screening Comments: Dr. Pandora Leiter 09/10/16 Hearing: Dentition:  Past Medical History:  Diagnosis Date  . Chronic kidney disease   . Hyperlipidemia   . Hypertension   . Insomnia, unspecified   . Memory loss   . Other abnormal blood chemistry   . Other specified cardiac dysrhythmias(427.89)   . Other specified disease of nail   . Stroke ([place])   . Syncope and collapse   .  Unspecified disorder of kidney and ureter   . Unspecified late effects of cerebrovascular disease   . Unspecified transient cerebral ischemia   . Unspecified urinary incontinence   . Unspecified vitamin D deficiency   . Vascular dementia, uncomplicated     No past surgical history on file.  Family History  Problem Relation Age of Onset  . Pneumonia Mother   . Kidney disease Sister   . Hypertension Brother     Social History   Social History  . Marital status: Single    Spouse name: N/A  . Number of children: N/A  . Years of education: N/A   Social History  Main Topics  . Smoking status: Former Smoker    Years: 30.00  . Smokeless tobacco: None     Comment: Quit about 7-8 years ago as of 2015   . Alcohol use No  . Drug use: No  . Sexual activity: Not Asked   Other Topics Concern  . None   Social History Narrative  . None    reports that she has quit smoking. She quit after 30.00 years of use. She does not have any smokeless tobacco history on file. She reports that she does not drink alcohol or use drugs.  No Known Allergies    Medication List       Accurate as of 09/11/16 11:47 AM. Always use your most recent med list.          albuterol 108 (90 Base) MCG/ACT inhaler Commonly known as:  PROVENTIL HFA;VENTOLIN HFA Inhale 2 puffs into the lungs every 6 (six) hours as needed for wheezing or shortness of breath.   amLODipine 10 MG tablet Commonly known as:  NORVASC TAKE ONE TABLET BY MOUTH ONCE DAILY FOR BLOOD PRESSURE   hydrALAZINE 25 MG tablet Commonly known as:  APRESOLINE TAKE 1 TABLET THREE TIMES DAILY WITH MEALS TO CONTROL  BLOOD PRESSURE   losartan 50 MG tablet Commonly known as:  COZAAR TAKE ONE TABLET ONCE DAILY FOR BLOOD PRESSURE   NAMZARIC 28-10 MG Cp24 Generic drug:  Memantine HCl-Donepezil HCl TAKE 1 CAPSULE BY MOUTH DAILY TO PRESERVE MEMORY   oxybutynin 5 MG tablet Commonly known as:  DITROPAN TAKE 1 TABLET ONE TIME DAILY FOR BLADDER CONTROL   simvastatin 40 MG tablet Commonly known as:  ZOCOR Take 1 tablet (40 mg total) by mouth daily at 6 PM.        Review of Systems:  Review of Systems  Constitutional: Negative for chills, fever and malaise/fatigue.  HENT: Positive for hearing loss.        Cerumen impaction  Eyes: Negative for blurred vision.  Respiratory: Negative for cough and shortness of breath.   Cardiovascular: Negative for chest pain and leg swelling.  Gastrointestinal: Negative for abdominal pain, blood in stool, constipation and melena.  Genitourinary: Positive for urgency.  Negative for dysuria.       Incontinence  Musculoskeletal: Negative for falls and myalgias.  Skin: Negative for itching and rash.  Neurological: Negative for dizziness, sensory change, speech change, loss of consciousness and weakness.  Endo/Heme/Allergies: Does not bruise/bleed easily.  Psychiatric/Behavioral: Positive for memory loss. Negative for depression. The patient is not nervous/anxious and does not have insomnia.     Physical Exam: Vitals:   09/11/16 0915  BP: 128/80  Pulse: 82  Temp: 98.4 F (36.9 C)  TempSrc: Oral  SpO2: 98%  Weight: 258 lb (117 kg)  Height: 5\' 6"  (1.676 m)   Body mass index is  41.64 kg/m. Physical Exam  Constitutional: She appears well-developed and well-nourished. No distress.  Obese black female  HENT:  Head: Normocephalic and atraumatic.  Right Ear: External ear normal.  Left Ear: External ear normal.  Nose: Nose normal.  Mouth/Throat: Oropharynx is clear and moist. No oropharyngeal exudate.  Bilateral cerumen impaction  Eyes: Conjunctivae and EOM are normal. Pupils are equal, round, and reactive to light.  Neck: Normal range of motion. Neck supple. No JVD present. No tracheal deviation present. No thyromegaly present.  Cardiovascular: Intact distal pulses.  Exam reveals no gallop and no friction rub.   No murmur heard. irregular  Pulmonary/Chest: Effort normal and breath sounds normal. No respiratory distress.  Abdominal: Soft. Bowel sounds are normal. She exhibits no distension and no mass. There is no tenderness. There is no rebound and no guarding.  Musculoskeletal: Normal range of motion. She exhibits no edema or tenderness.  Neurological: She is alert. No cranial nerve deficit.  Oriented to person, place not time/date  Skin: Skin is warm and dry. Capillary refill takes less than 2 seconds.  Fungal toenails  Psychiatric: She has a normal mood and affect.  Pt jokes around when asked questions    Labs reviewed: Basic Metabolic  Panel:  Recent Labs  12/07/15 1100 09/01/16 0843  NA 141 141  K 4.4 3.9  CL 103 107  CO2 22 24  GLUCOSE 103* 102*  BUN 16 20  CREATININE 1.24* 1.27*  CALCIUM 9.5 9.4   Liver Function Tests:  Recent Labs  12/07/15 1100 09/01/16 0843  AST 20 15  ALT 20 12  ALKPHOS 96 82  BILITOT 0.6 0.7  PROT 7.1 7.0  ALBUMIN 4.0 3.9   No results for input(s): LIPASE, AMYLASE in the last 8760 hours. No results for input(s): AMMONIA in the last 8760 hours. CBC:  Recent Labs  12/07/15 1100 09/01/16 0843  WBC 8.5 9.0  NEUTROABS 3.3 3,870  HGB  --  13.4  HCT 41.0 40.6  MCV 83 85.7  PLT 276 278   Lipid Panel:  Recent Labs  12/07/15 1100 09/01/16 0843  CHOL 178 166  HDL 81 77  LDLCALC 87 81  TRIG 52 41  CHOLHDL 2.2 2.2   Lab Results  Component Value Date   HGBA1C 5.3 09/01/2016    Procedures: EKG today:  Sinus rhythm, PACs, trigeminy, prolonged QT interval (QTc 481), HR 81 bpm  Assessment/Plan 1. Medicare annual wellness visit, subsequent -see flow sheets and nurse assessment, hpi -previously ordered cologuard on pt--was canceled 2x due to not getting done by patient and her daughter -pt not a classic pt for OP so hold off on bone density -best practice documentation completed. -has mammogram 8/16  2. Essential hypertension -bp at goal with current regimen, cont same and monitor -encouraged healthy diet and exercise with walking - EKG 12-Lead - Ambulatory referral to Cardiology  3. Vascular dementia, uncomplicated -is gradually progressing, may be mixed with AD -continues on namenda XR -have counseled pt and her daughter that oxybutynin is counteracting the aricept portion, but they cannot afford myrbetriq and she has less incontinence with the oxybutynin so they wanted to continue it  4. Mixed hyperlipidemia - increase simvastatin (not having complications) and cont to work on diet and exercise for weight loss - simvastatin (ZOCOR) 40 MG tablet; Take 1  tablet (40 mg total) by mouth daily at 6 PM.  Dispense: 90 tablet; Refill: 3  5. Chronic kidney disease, stage 2 (mild) -has been stable, avoid  nsaids, hydrate  6. Urge urinary incontinence -continues on oxybutynin but I don't recommend considering her dementia  7. Trigeminy -new problem noted on EKG today along with increasingly prolonged QT-c interval -will have her seen by cardiology to address this new arrhythmia - Ambulatory referral to Cardiology  8. QT prolongation - see #7 - Ambulatory referral to Cardiology  9. Bilateral hearing loss due to cerumen impaction -ears were flushed by CMA today  10. Hyperglycemia -continue to work on diet and exercise, hba1c has improved since last check   Labs/tests ordered:   Orders Placed This Encounter  Procedures  . Ambulatory referral to Cardiology    Referral Priority:   Routine    Referral Type:   Consultation    Referral Reason:   Specialty Services Required    Requested Specialty:   Cardiology    Number of Visits Requested:   1  . EKG 12-Lead   Next appt:  6 mos med mgt   Geovana Gebel L. Lathyn Griggs, D.O. [facility] [address], [place] [address] Cell Phone (Mon-Fri 8am-5pm):  [phone] On Call:  [phone] & follow prompts after 5pm & weekends Office Phone:  [phone] Office Fax:  [phone]

## 2016-09-18 ENCOUNTER — Encounter: Payer: Self-pay | Admitting: *Deleted

## 2016-10-02 ENCOUNTER — Encounter: Payer: Self-pay | Admitting: Cardiology

## 2016-10-02 ENCOUNTER — Encounter (INDEPENDENT_AMBULATORY_CARE_PROVIDER_SITE_OTHER): Payer: Self-pay

## 2016-10-02 ENCOUNTER — Ambulatory Visit (INDEPENDENT_AMBULATORY_CARE_PROVIDER_SITE_OTHER): Payer: Commercial Managed Care - HMO | Admitting: Cardiology

## 2016-10-02 VITALS — BP 134/76 | HR 88 | Ht 63.5 in | Wt 256.2 lb

## 2016-10-02 DIAGNOSIS — I491 Atrial premature depolarization: Secondary | ICD-10-CM

## 2016-10-02 NOTE — Patient Instructions (Signed)
Medication Instructions:  Your physician recommends that you continue on your current medications as directed. Please refer to the Current Medication list given to you today.  * If you need a refill on your cardiac medications before your next appointment, please call your pharmacy.   Labwork: None ordered  Testing/Procedures: None ordered  Follow-Up: No follow up is needed at this time with Dr. Camnitz.  He will see you on an as needed basis.   Thank you for choosing [facility]!!   Sherri Price, RN [phone]     

## 2016-10-02 NOTE — Progress Notes (Signed)
Electrophysiology Office Note   Date:  10/02/2016   ID:  [name], [name] 1946-07-06, MRN [id number]  PCP:  Hollace Kinnier, DO Primary Electrophysiologist:  Arnell Mausolf Meredith Leeds, MD    Chief Complaint  Patient presents with  . New Patient (Initial Visit)    Essential HTN/Trigeminy/QT prolongation     History of Present Illness: [name] is a 70 y.o. female who presents today for electrophysiology evaluation.   Hx CKD, HLD, HTN.  Found to have bigeminy by her PCP.  She says that she feels well without complaint. She does not have extreme fatigue, shortness of breath, chest pain, PND, or orthopnea. She is able to do all of her daily activities. The trigeminy was found on a routine EKG. Also found that her QTC was prolonged. Her QTC today clinic is 479.   Today, she denies symptoms of palpitations, chest pain, shortness of breath, orthopnea, PND, lower extremity edema, claudication, dizziness, presyncope, syncope, bleeding, or neurologic sequela. The patient is tolerating medications without difficulties and is otherwise without complaint today.    Past Medical History:  Diagnosis Date  . Chronic kidney disease   . Hyperlipidemia   . Hypertension   . Insomnia, unspecified   . Memory loss   . Other abnormal blood chemistry   . Other specified cardiac dysrhythmias(427.89)   . Other specified disease of nail   . Stroke ([place])   . Syncope and collapse   . Unspecified disorder of kidney and ureter   . Unspecified late effects of cerebrovascular disease   . Unspecified transient cerebral ischemia   . Unspecified urinary incontinence   . Unspecified vitamin D deficiency   . Vascular dementia, uncomplicated    No past surgical history on file.   Current Outpatient Prescriptions  Medication Sig Dispense Refill  . albuterol (PROVENTIL HFA;VENTOLIN HFA) 108 (90 BASE) MCG/ACT inhaler Inhale 2 puffs into the lungs every 6 (six) hours as needed for wheezing or shortness of  breath. 1 Inhaler 0  . amLODipine (NORVASC) 10 MG tablet TAKE ONE TABLET BY MOUTH ONCE DAILY FOR BLOOD PRESSURE 90 tablet 1  . hydrALAZINE (APRESOLINE) 25 MG tablet TAKE 1 TABLET THREE TIMES DAILY WITH MEALS TO CONTROL  BLOOD PRESSURE 270 tablet 1  . losartan (COZAAR) 50 MG tablet TAKE ONE TABLET ONCE DAILY FOR BLOOD PRESSURE 90 tablet 1  . NAMZARIC 28-10 MG CP24 TAKE 1 CAPSULE BY MOUTH DAILY TO PRESERVE MEMORY 90 capsule 1  . oxybutynin (DITROPAN) 5 MG tablet TAKE 1 TABLET ONE TIME DAILY FOR BLADDER CONTROL 90 tablet 1  . simvastatin (ZOCOR) 20 MG tablet Take 20 mg by mouth 2 (two) times daily.     No current facility-administered medications for this visit.     Allergies:   Review of patient's allergies indicates no known allergies.   Social History:  The patient  reports that she has quit smoking. She quit after 30.00 years of use. She does not have any smokeless tobacco history on file. She reports that she does not drink alcohol or use drugs.   Family History:  The patient's family history includes Hypertension in her brother; Kidney disease in her sister; Pneumonia in her mother.    ROS:  Please see the history of present illness.   Otherwise, review of systems is positive for palpitations.   All other systems are reviewed and negative.    PHYSICAL EXAM: VS:  BP 134/76   Pulse 88   Ht 5' 3.5" (1.613 m)  Wt 256 lb 3.2 oz (116.2 kg)   BMI 44.67 kg/m  , BMI Body mass index is 44.67 kg/m. GEN: Well nourished, well developed, in no acute distress  HEENT: normal  Neck: no JVD, carotid bruits, or masses Cardiac: iRRR; no murmurs, rubs, or gallops,no edema  Respiratory:  clear to auscultation bilaterally, normal work of breathing GI: soft, nontender, nondistended, + BS MS: no deformity or atrophy  Skin: warm and dry Neuro:  Strength and sensation are intact Psych: euthymic mood, full affect  EKG:  EKG is ordered today. Personal review of the ekg ordered shows sinus rhythm,  APCs, prolonged QTc  Recent Labs: 09/01/2016: ALT 12; BUN 20; Creat 1.27; Hemoglobin 13.4; Platelets 278; Potassium 3.9; Sodium 141    Lipid Panel     Component Value Date/Time   CHOL 166 09/01/2016 0843   CHOL 178 12/07/2015 1100   TRIG 41 09/01/2016 0843   HDL 77 09/01/2016 0843   HDL 81 12/07/2015 1100   CHOLHDL 2.2 09/01/2016 0843   VLDL 8 09/01/2016 0843   LDLCALC 81 09/01/2016 0843   LDLCALC 87 12/07/2015 1100     Wt Readings from Last 3 Encounters:  10/02/16 256 lb 3.2 oz (116.2 kg)  09/11/16 258 lb (117 kg)  03/03/16 250 lb (113.4 kg)      Other studies Reviewed: Additional studies/ records that were reviewed today include: PCP notes   ASSESSMENT AND PLAN:  1.  Hypertension: Currently well controlled. She has had a stroke in the past that was apparently due to a hypertensive emergency. No changes were made today and her blood pressure medications.  2.  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 Fortunately she is not symptomatic from these at this time. Due to that, she does require therapy. If she does begin to develop fatigue, shortness of breath, or palpitations, beta blockers would likely be warranted.  3. Prolonged QTc: Today the QTc measures 479 ms. Would work to avoid any QT prolonging medications. She does not have any history of sudden death. Would continue to monitor.   Current medicines are reviewed at length with the patient today.   The patient does not have concerns regarding her medicines.  The following changes were made today:  none  Labs/ tests ordered today include:  Orders Placed This Encounter  Procedures  . EKG 12-Lead     Disposition:   FU with Avedis Bevis PRN  Signed, Roslind Michaux Meredith Leeds, MD  10/02/2016 2:36 PM     [address] [place] [address] [phone] (office) [phone] (fax)

## 2016-12-08 ENCOUNTER — Other Ambulatory Visit: Payer: Self-pay | Admitting: Internal Medicine

## 2017-02-11 ENCOUNTER — Other Ambulatory Visit: Payer: Self-pay | Admitting: Internal Medicine

## 2017-02-16 ENCOUNTER — Encounter (HOSPITAL_COMMUNITY): Payer: Self-pay | Admitting: Emergency Medicine

## 2017-02-16 ENCOUNTER — Ambulatory Visit (HOSPITAL_COMMUNITY)
Admission: EM | Admit: 2017-02-16 | Discharge: 2017-02-16 | Disposition: A | Discharge status: Home / Self Care | Payer: Medicare HMO | Attending: Emergency Medicine | Admitting: Emergency Medicine

## 2017-02-16 DIAGNOSIS — M791 Myalgia: Secondary | ICD-10-CM | POA: Diagnosis not present

## 2017-02-16 DIAGNOSIS — M542 Cervicalgia: Secondary | ICD-10-CM

## 2017-02-16 DIAGNOSIS — M7912 Myalgia of auxiliary muscles, head and neck: Secondary | ICD-10-CM

## 2017-02-16 MED ORDER — CAMPHOR-MENTHOL-METHYL SAL 1.2-5.7-6.3 % EX PTCH: 1.0000 | MEDICATED_PATCH | Freq: Three times a day (TID) | CUTANEOUS | 0 refills | Status: DC

## 2017-02-16 NOTE — ED Triage Notes (Signed)
Pt complained of stiff neck x 4 days. No known injury

## 2017-02-16 NOTE — Discharge Instructions (Signed)
Try some Tylenol 500 mg 1 g 3-4 times a day. Try the menthol patches and some heat. This should make her feel significantly better. Try small range of motion exercises as well.

## 2017-02-16 NOTE — ED Provider Notes (Signed)
HPI  SUBJECTIVE:  [name] is a 71 y.o. female who presents with a week of intermittent, seconds long, left-sided neck pain described as "squeezing". She states that the pain is worse with turning her head and neck flexion. She has tried BenGay without improvement of symptoms. Symptoms are better with sitting still. She denies radiation of the pain down her arm, neck stiffness, fevers, sore throat, headache, photophobia, rash. No arm or shoulder pain. No dysarthria, coordination, arm weakness. No trauma to the neck. She is not sure if she may have slept on it incorrectly. Her neck pain is not associated with arm movement. She states that it is not getting worse or better but overall is not changing. She has never had symptoms like this before. She has a past medical history stroke. She states that she did not have neck pain with that. Also history of hypertension, chronic kidney disease, syncope. No history of diabetes, cancer, osteoporosis. UXL:KGMW, TIFFANY, DO     Past Medical History:  Diagnosis Date  . Chronic kidney disease   . Hyperlipidemia   . Hypertension   . Insomnia, unspecified   . Memory loss   . Other abnormal blood chemistry   . Other specified cardiac dysrhythmias(427.89)   . Other specified disease of nail   . Stroke ([place])   . Syncope and collapse   . Unspecified disorder of kidney and ureter   . Unspecified late effects of cerebrovascular disease   . Unspecified transient cerebral ischemia   . Unspecified urinary incontinence   . Unspecified vitamin D deficiency   . Vascular dementia, uncomplicated     History reviewed. No pertinent surgical history.  Family History  Problem Relation Age of Onset  . Pneumonia Mother   . Kidney disease Sister   . Hypertension Brother     Social History  Substance Use Topics  . Smoking status: Former Smoker    Years: 30.00  . Smokeless tobacco: Not on file     Comment: Quit about 7-8 years ago as of 2015   . Alcohol  use No    No current facility-administered medications for this encounter.   Current Outpatient Prescriptions:  .  albuterol (PROVENTIL HFA;VENTOLIN HFA) 108 (90 BASE) MCG/ACT inhaler, Inhale 2 puffs into the lungs every 6 (six) hours as needed for wheezing or shortness of breath., Disp: 1 Inhaler, Rfl: 0 .  amLODipine (NORVASC) 10 MG tablet, TAKE 1 TABLET ONCE DAILY FOR BLOOD PRESSURE, Disp: 90 tablet, Rfl: 1 .  Camphor-Menthol-Methyl Sal (HM SALONPAS PAIN RELIEF) 1.2-5.7-6.3 % PTCH, Apply 1 patch topically 3 (three) times daily. Leave in place for up to 12 hours., Disp: 30 patch, Rfl: 0 .  hydrALAZINE (APRESOLINE) 25 MG tablet, TAKE 1 TABLET THREE TIMES DAILY WITH MEALS TO CONTROL  BLOOD PRESSURE, Disp: 270 tablet, Rfl: 1 .  losartan (COZAAR) 50 MG tablet, TAKE 1 TABLET ONCE DAILY FOR BLOOD PRESSURE, Disp: 90 tablet, Rfl: 1 .  NAMZARIC 28-10 MG CP24, TAKE 1 CAPSULE  DAILY TO PRESERVE MEMORY, Disp: 90 capsule, Rfl: 1 .  oxybutynin (DITROPAN) 5 MG tablet, TAKE 1 TABLET ONE TIME DAILY FOR BLADDER CONTROL, Disp: 90 tablet, Rfl: 1 .  simvastatin (ZOCOR) 20 MG tablet, Take 20 mg by mouth 2 (two) times daily., Disp: , Rfl:   No Known Allergies   ROS  As noted in HPI.   Physical Exam  BP 163/94 (BP Location: Left Arm)   Pulse 74   Temp 98.8 F (37.1 C) (Oral)  Resp 22   SpO2 100%   Constitutional: Well developed, well nourished, no acute distress Eyes:  EOMI, conjunctiva normal bilaterally HENT: Normocephalic, atraumatic,mucus membranes moist. No facial swelling. Respiratory: Normal inspiratory effort Cardiovascular: Normal rate regular rhythm. No carotid bruit left side. GI: nondistended skin: No rash, skin intact Musculoskeletal: no deformities. No Meningismus. Tenderness of the left sternocleidomastoid muscle especially at the insertion at the occiput. No tenderness along the trapezius. Patient able to fully flex and extend her neck. She has slightly limited range of motion with  turning her head to the left due to pain. She is able to turn her head to 45 on the right side. No tenderness over the shoulder, arm. She is able to perform full active range of motion of her shoulders and arms without any problem. Neurologic: Alert & oriented x 3, cranial nerves II through XII intact grip strength 5/5 and equal bilateral. She is moving her upper or lower extremities equally.  Psychiatric: Speech and behavior appropriate   ED Course   Medications - No data to display  No orders of the defined types were placed in this encounter.   No results found for this or any previous visit (from the past 24 hour(s)). No results found.  ED Clinical Impression  Sternocleidomastoid muscle tenderness   ED Assessment/Plan  Presentation most consistent with a sternocleidomastoid muscle spasm. It does not appear to be vascular, meningitis, or dental. I'm hesitant to prescribe muscle relaxants due to her age, and topical NSAIDs due to her chronic kidney disease although her most recent creatinine was 1.27. Will try Tylenol 500 mg 1 g 3-4 times a day, Salon Pas menthol 5% patches, heat. Follow-up with primary care physician. Discussed MDM, plan and followup with patient, caregiver Discussed sn/sx that should prompt return to the ED. They agree with plan.   Meds ordered this encounter  Medications  . Camphor-Menthol-Methyl Sal (HM SALONPAS PAIN RELIEF) 1.2-5.7-6.3 % PTCH    Sig: Apply 1 patch topically 3 (three) times daily. Leave in place for up to 12 hours.    Dispense:  30 patch    Refill:  0    *This clinic note was created using L[employer]. Therefore, there may be occasional mistakes despite careful proofreading.  ?   Melynda Ripple, MD 02/16/17 1249

## 2017-03-12 ENCOUNTER — Ambulatory Visit: Payer: Commercial Managed Care - HMO | Admitting: Internal Medicine

## 2017-03-27 DIAGNOSIS — H401231 Low-tension glaucoma, bilateral, mild stage: Secondary | ICD-10-CM | POA: Diagnosis not present

## 2017-04-24 ENCOUNTER — Ambulatory Visit (INDEPENDENT_AMBULATORY_CARE_PROVIDER_SITE_OTHER): Payer: Medicare HMO | Admitting: Internal Medicine

## 2017-04-24 ENCOUNTER — Encounter: Payer: Self-pay | Admitting: Internal Medicine

## 2017-04-24 VITALS — BP 120/80 | HR 72 | Temp 98.3°F | Wt 253.0 lb

## 2017-04-24 DIAGNOSIS — E7800 Pure hypercholesterolemia, unspecified: Secondary | ICD-10-CM

## 2017-04-24 DIAGNOSIS — Z1231 Encounter for screening mammogram for malignant neoplasm of breast: Secondary | ICD-10-CM | POA: Diagnosis not present

## 2017-04-24 DIAGNOSIS — I10 Essential (primary) hypertension: Secondary | ICD-10-CM | POA: Diagnosis not present

## 2017-04-24 DIAGNOSIS — F0150 Vascular dementia without behavioral disturbance: Secondary | ICD-10-CM

## 2017-04-24 DIAGNOSIS — N182 Chronic kidney disease, stage 2 (mild): Secondary | ICD-10-CM

## 2017-04-24 DIAGNOSIS — M436 Torticollis: Secondary | ICD-10-CM | POA: Diagnosis not present

## 2017-04-24 DIAGNOSIS — R739 Hyperglycemia, unspecified: Secondary | ICD-10-CM | POA: Diagnosis not present

## 2017-04-24 DIAGNOSIS — E2839 Other primary ovarian failure: Secondary | ICD-10-CM

## 2017-04-24 DIAGNOSIS — Z1239 Encounter for other screening for malignant neoplasm of breast: Secondary | ICD-10-CM

## 2017-04-24 DIAGNOSIS — N3941 Urge incontinence: Secondary | ICD-10-CM

## 2017-04-24 NOTE — Patient Instructions (Signed)
Please complete the cologuard screening before the next visit (as soon as possible). Get your mammogram and bone density done in August after 8/6. Get your labs before I see you again (come fasting for labs) in 6 months.

## 2017-04-24 NOTE — Progress Notes (Signed)
Location:  [facility] clinic Provider:  Malosi Hemstreet [name], D.O., C.M.D.  Code Status: DNR previously discussed pre-epic, but not on file Goals of Care:  Advanced Directives 04/24/2017  Does Patient Have a Medical Advance Directive? No  Type of Advance Directive -  Does patient want to make changes to medical advance directive? -  Copy of [place] in Chart? -  Would patient like information on creating a medical advance directive? No - Patient declined  Pre-existing out of facility DNR order (yellow form or pink MOST form) -   Chief Complaint  Patient presents with  . Medical Management of Chronic Issues    57mth follow-up    HPI: Patient is a 71 y.o. female seen today for medical management of chronic diseases.    Eating well, sleeping well.  No pain.  Bowels moving well.  Denies vision problems.  Hearing well.    Mammo and bone density due august. They are still trying to get the cologuard.    Torticollis is better from March.    Past Medical History:  Diagnosis Date  . Chronic kidney disease   . Hyperlipidemia   . Hypertension   . Insomnia, unspecified   . Memory loss   . Other abnormal blood chemistry   . Other specified cardiac dysrhythmias(427.89)   . Other specified disease of nail   . Stroke ([place])   . Syncope and collapse   . Unspecified disorder of kidney and ureter   . Unspecified late effects of cerebrovascular disease   . Unspecified transient cerebral ischemia   . Unspecified urinary incontinence   . Unspecified vitamin D deficiency   . Vascular dementia, uncomplicated     No past surgical history on file.  No Known Allergies  Allergies as of 04/24/2017   No Known Allergies     Medication List       Accurate as of 04/24/17 11:03 AM. Always use your most recent med list.          albuterol 108 (90 Base) MCG/ACT inhaler Commonly known as:  PROVENTIL HFA;VENTOLIN HFA Inhale 2 puffs into the lungs every 6 (six) hours as needed for  wheezing or shortness of breath.   amLODipine 10 MG tablet Commonly known as:  NORVASC TAKE 1 TABLET ONCE DAILY FOR BLOOD PRESSURE   Camphor-Menthol-Methyl Sal 1.2-5.7-6.3 % Ptch Commonly known as:  HM SALONPAS PAIN RELIEF Apply 1 patch topically 3 (three) times daily. Leave in place for up to 12 hours.   hydrALAZINE 25 MG tablet Commonly known as:  APRESOLINE TAKE 1 TABLET THREE TIMES DAILY WITH MEALS TO CONTROL  BLOOD PRESSURE   losartan 50 MG tablet Commonly known as:  COZAAR TAKE 1 TABLET ONCE DAILY FOR BLOOD PRESSURE   NAMZARIC 28-10 MG Cp24 Generic drug:  Memantine HCl-Donepezil HCl TAKE 1 CAPSULE  DAILY TO PRESERVE MEMORY   oxybutynin 5 MG tablet Commonly known as:  DITROPAN TAKE 1 TABLET ONE TIME DAILY FOR BLADDER CONTROL   simvastatin 20 MG tablet Commonly known as:  ZOCOR Take 20 mg by mouth 2 (two) times daily.       Review of Systems:  Review of Systems  Constitutional: Negative for chills, fever and malaise/fatigue.  HENT: Positive for hearing loss. Negative for congestion.   Eyes: Negative for blurred vision.  Respiratory: Negative for cough and shortness of breath.   Cardiovascular: Negative for chest pain, palpitations and leg swelling.  Gastrointestinal: Negative for abdominal pain, blood in stool, constipation and  melena.  Genitourinary: Negative for dysuria.  Musculoskeletal: Positive for myalgias and neck pain. Negative for falls.  Skin: Negative for itching and rash.  Neurological: Negative for dizziness, loss of consciousness and weakness.  Psychiatric/Behavioral: Positive for memory loss. Negative for depression. The patient is not nervous/anxious and does not have insomnia.     Health Maintenance  Topic Date Due  . Hepatitis C Screening  1946-05-03  . COLONOSCOPY  07/07/2017 (Originally 04/20/1994)  . INFLUENZA VACCINE  07/08/2017  . MAMMOGRAM  07/12/2017  . TETANUS/TDAP  05/06/2021  . DEXA SCAN  Completed  . PNA vac Low Risk Adult   Completed    Physical Exam: Vitals:   04/24/17 1036  BP: 120/80  Pulse: 72  Temp: 98.3 F (36.8 C)  TempSrc: Oral  SpO2: 96%  Weight: 253 lb (114.8 kg)   Body mass index is 44.11 kg/m. Physical Exam  Constitutional: She appears well-developed and well-nourished. No distress.  obese  Cardiovascular: Normal rate, regular rhythm, normal heart sounds and intact distal pulses.   Pulmonary/Chest: Effort normal and breath sounds normal. No respiratory distress.  Abdominal: Soft. Bowel sounds are normal. She exhibits no distension. There is no tenderness.  Musculoskeletal: Normal range of motion.  Neurological: She is alert.  Oriented to person  Skin: Skin is warm and dry. Capillary refill takes less than 2 seconds.  Psychiatric: She has a normal mood and affect.    Labs reviewed: Basic Metabolic Panel:  Recent Labs  09/01/16 0843  NA 141  K 3.9  CL 107  CO2 24  GLUCOSE 102*  BUN 20  CREATININE 1.27*  CALCIUM 9.4   Liver Function Tests:  Recent Labs  09/01/16 0843  AST 15  ALT 12  ALKPHOS 82  BILITOT 0.7  PROT 7.0  ALBUMIN 3.9   No results for input(s): LIPASE, AMYLASE in the last 8760 hours. No results for input(s): AMMONIA in the last 8760 hours. CBC:  Recent Labs  09/01/16 0843  WBC 9.0  NEUTROABS 3,870  HGB 13.4  HCT 40.6  MCV 85.7  PLT 278   Lipid Panel:  Recent Labs  09/01/16 0843  CHOL 166  HDL 77  LDLCALC 81  TRIG 41  CHOLHDL 2.2   Lab Results  Component Value Date   HGBA1C 5.3 09/01/2016    Assessment/Plan 1. Vascular dementia, uncomplicated -cont to monitor mmse annually and use secondary prevention with bp, lipid, glucose control--encouraged exercise healthy diet and weight loss  2. Stage 2 chronic kidney disease -encourage hydration, avoid nephrotoxic agents and dose adjust meds that are renally excreted - CBC with Differential/Platelet; Future - COMPLETE METABOLIC PANEL WITH GFR; Future  3. Hyperglycemia -cont to  work on diet and exercise (does not exercise), weight is down 3 lbs from last visit, monitor - Hemoglobin A1c; Future  4. Pure hypercholesterolemia -cont to work on diet and exercise (does not exercise), weight is down 3 lbs from last visit, monitor - Lipid panel; Future  5. Urge urinary incontinence -educated previously (pt and dtr) about dangers of oxybutynin therapy in pt with dementia but mybetriq was unaffordable and the oxybutynin has helped her bladder control so they chose to continue it  6. Essential hypertension -bp at goal with current therapy, cont same and monitor  7. Torticollis, acute -resolved, has some minor residual neck muscle spasms, but typically ok most days since ED visit  8. Screening for breast cancer - MM DIGITAL SCREENING BILATERAL; Future  9. Estrogen deficiency - DG Bone Density;  Future --has been ordered multiple times and does not get done  Has cologuard but pt not informing her daughter when she has to have a bm so it has not gotten done--reminded patient of this and suggested preparing ahead so it can get done  Labs/tests ordered:   Orders Placed This Encounter  Procedures  . MM DIGITAL SCREENING BILATERAL    Standing Status:   Future    Standing Expiration Date:   06/24/2018    Order Specific Question:   Reason for exam:    Answer:   routine breast cancer screening    Order Specific Question:   Preferred imaging location?    Answer:   [facility]  . DG Bone Density    Standing Status:   Future    Standing Expiration Date:   06/24/2018    Order Specific Question:   Reason for Exam (SYMPTOM  OR DIAGNOSIS REQUIRED)    Answer:   estrogen deficiency    Order Specific Question:   Preferred imaging location?    Answer:   [facility]  . CBC with Differential/Platelet    Standing Status:   Future    Standing Expiration Date:   04/24/2018  . COMPLETE METABOLIC PANEL WITH GFR    Standing Status:   Future    Standing Expiration Date:    04/24/2018  . Hemoglobin A1c    Standing Status:   Future    Standing Expiration Date:   04/24/2018  . Lipid panel    Standing Status:   Future    Standing Expiration Date:   04/24/2018    Next appt:  09/14/2017  Jaxyn Rout L. Viviana Trimble, D.O. [facility] [address], [place] [address] Cell Phone (Mon-Fri 8am-5pm):  [phone] On Call:  [phone] & follow prompts after 5pm & weekends Office Phone:  [phone] Office Fax:  [phone]

## 2017-05-07 ENCOUNTER — Other Ambulatory Visit: Payer: Self-pay | Admitting: Internal Medicine

## 2017-07-27 ENCOUNTER — Other Ambulatory Visit: Payer: Self-pay | Admitting: Internal Medicine

## 2017-09-10 ENCOUNTER — Other Ambulatory Visit: Payer: Medicare HMO

## 2017-09-14 ENCOUNTER — Encounter: Payer: Self-pay | Admitting: Internal Medicine

## 2017-09-14 ENCOUNTER — Ambulatory Visit: Payer: Self-pay

## 2017-10-05 ENCOUNTER — Ambulatory Visit
Admission: RE | Admit: 2017-10-05 | Discharge: 2017-10-05 | Disposition: A | Discharge status: Home / Self Care | Payer: Medicare HMO | Source: Ambulatory Visit | Attending: Internal Medicine | Admitting: Internal Medicine

## 2017-10-05 DIAGNOSIS — M8589 Other specified disorders of bone density and structure, multiple sites: Secondary | ICD-10-CM | POA: Diagnosis not present

## 2017-10-05 DIAGNOSIS — Z1231 Encounter for screening mammogram for malignant neoplasm of breast: Secondary | ICD-10-CM | POA: Diagnosis not present

## 2017-10-05 DIAGNOSIS — E2839 Other primary ovarian failure: Secondary | ICD-10-CM

## 2017-10-05 DIAGNOSIS — Z780 Asymptomatic menopausal state: Secondary | ICD-10-CM | POA: Diagnosis not present

## 2017-10-05 DIAGNOSIS — Z1239 Encounter for other screening for malignant neoplasm of breast: Secondary | ICD-10-CM

## 2017-10-09 ENCOUNTER — Encounter: Payer: Self-pay | Admitting: Internal Medicine

## 2017-10-09 DIAGNOSIS — H401231 Low-tension glaucoma, bilateral, mild stage: Secondary | ICD-10-CM | POA: Diagnosis not present

## 2017-10-09 DIAGNOSIS — H353131 Nonexudative age-related macular degeneration, bilateral, early dry stage: Secondary | ICD-10-CM | POA: Diagnosis not present

## 2017-10-09 DIAGNOSIS — H2513 Age-related nuclear cataract, bilateral: Secondary | ICD-10-CM | POA: Diagnosis not present

## 2017-10-09 LAB — HM DIABETES EYE EXAM

## 2017-10-13 NOTE — Telephone Encounter (Signed)
Opened in error

## 2017-10-27 ENCOUNTER — Other Ambulatory Visit: Payer: Self-pay | Admitting: Internal Medicine

## 2017-11-26 DIAGNOSIS — Z1212 Encounter for screening for malignant neoplasm of rectum: Secondary | ICD-10-CM | POA: Diagnosis not present

## 2017-11-26 DIAGNOSIS — Z1211 Encounter for screening for malignant neoplasm of colon: Secondary | ICD-10-CM | POA: Diagnosis not present

## 2017-11-27 LAB — COLOGUARD: COLOGUARD: POSITIVE

## 2017-12-11 ENCOUNTER — Other Ambulatory Visit: Payer: Self-pay | Admitting: Internal Medicine

## 2017-12-14 ENCOUNTER — Telehealth: Payer: Self-pay | Admitting: *Deleted

## 2017-12-14 NOTE — Telephone Encounter (Signed)
[name] with [facility] called and and stated that they received a stool specimen from patient but the order date is over a year old 03/03/16 and they need a new order faxed to them with a current date in order to process it. They will call patient to let them know they will be processing once order received. Order faxed.

## 2017-12-18 ENCOUNTER — Other Ambulatory Visit: Payer: Self-pay | Admitting: *Deleted

## 2017-12-18 MED ORDER — MEMANTINE HCL-DONEPEZIL HCL ER 28-10 MG PO CP24: 1.0000 | ORAL_CAPSULE | Freq: Every day | ORAL | 0 refills | Status: DC

## 2017-12-18 NOTE — Telephone Encounter (Signed)
Caregiver called requesting Rx to be sent to Local pharmacy due to delay in Mail Order.

## 2017-12-25 ENCOUNTER — Ambulatory Visit (INDEPENDENT_AMBULATORY_CARE_PROVIDER_SITE_OTHER): Payer: Medicare HMO

## 2017-12-25 ENCOUNTER — Other Ambulatory Visit: Payer: Self-pay

## 2017-12-25 VITALS — BP 138/62 | HR 98 | Temp 97.6°F | Ht 64.0 in | Wt 260.0 lb

## 2017-12-25 DIAGNOSIS — Z0000 Encounter for general adult medical examination without abnormal findings: Secondary | ICD-10-CM | POA: Diagnosis not present

## 2017-12-25 DIAGNOSIS — Z23 Encounter for immunization: Secondary | ICD-10-CM

## 2017-12-25 DIAGNOSIS — R739 Hyperglycemia, unspecified: Secondary | ICD-10-CM | POA: Diagnosis not present

## 2017-12-25 DIAGNOSIS — E7800 Pure hypercholesterolemia, unspecified: Secondary | ICD-10-CM

## 2017-12-25 DIAGNOSIS — N182 Chronic kidney disease, stage 2 (mild): Secondary | ICD-10-CM | POA: Diagnosis not present

## 2017-12-25 LAB — CBC WITH DIFFERENTIAL/PLATELET
Basophils Absolute: 40 cells/uL (ref 0–200)
Basophils Relative: 0.4 %
Eosinophils Absolute: 158 cells/uL (ref 15–500)
Eosinophils Relative: 1.6 %
HCT: 42.4 % (ref 35.0–45.0)
Hemoglobin: 13.7 g/dL (ref 11.7–15.5)
Lymphs Abs: 4673 cells/uL — ABNORMAL HIGH (ref 850–3900)
MCH: 27.5 pg (ref 27.0–33.0)
MCHC: 32.3 g/dL (ref 32.0–36.0)
MCV: 85.1 fL (ref 80.0–100.0)
MPV: 10.1 fL (ref 7.5–12.5)
Monocytes Relative: 9.3 %
Neutro Abs: 4109 cells/uL (ref 1500–7800)
Neutrophils Relative %: 41.5 %
Platelets: 278 10*3/uL (ref 140–400)
RBC: 4.98 10*6/uL (ref 3.80–5.10)
RDW: 12.4 % (ref 11.0–15.0)
Total Lymphocyte: 47.2 %
WBC mixed population: 921 cells/uL (ref 200–950)
WBC: 9.9 10*3/uL (ref 3.8–10.8)

## 2017-12-25 NOTE — Progress Notes (Signed)
Subjective:   [name] is a 72 y.o. female who presents for Medicare Annual (Subsequent) preventive examination.   Last AWV-09/11/2016       Objective:     Vitals: BP 138/62 (BP Location: Left Arm, Patient Position: Sitting)   Pulse 98   Temp 97.6 F (36.4 C) (Oral)   Ht 5\' 4"  (1.626 m)   Wt 260 lb (117.9 kg)   SpO2 95%   BMI 44.63 kg/m   Body mass index is 44.63 kg/m.  Advanced Directives 12/25/2017 04/24/2017 04/24/2017 09/11/2016 03/03/2016 08/06/2015 08/04/2014  Does Patient Have a Medical Advance Directive? Yes No No Yes Yes Yes Yes  Type of Advance Directive Healthcare Power of [place];Living will;Out of facility DNR (pink MOST or yellow form) Healthcare Power of Attorney  Does patient want to make changes to medical advance directive? No - Patient declined - - - - No - Patient declined -  Copy of [place] in Chart? No - copy requested - - No - copy requested Yes Yes No - copy requested  Would patient like information on creating a medical advance directive? - No - Patient declined No - Patient declined - - - -  Pre-existing out of facility DNR order (yellow form or pink MOST form) - - - - - Yellow form placed in chart (order not valid for inpatient use) -    Tobacco Social History   Tobacco Use  Smoking Status Former Smoker  . Years: 30.00  Smokeless Tobacco Never Used  Tobacco Comment   Quit about 7-8 years ago as of 2015      Counseling given: Not Answered Comment: Quit about 7-8 years ago as of 2015    Clinical Intake:  Pre-visit preparation completed: No  Pain : No/denies pain     Nutritional Status: BMI > 30  Obese Nutritional Risks: None Diabetes: No  How often do you need to have someone help you when you read instructions, pamphlets, or other written materials from your doctor or pharmacy?: 1 - Never What is the last grade level you completed in school?: Some  college  Interpreter Needed?: No  Information entered by :: [name], RN  Past Medical History:  Diagnosis Date  . Chronic kidney disease   . Hyperlipidemia   . Hypertension   . Insomnia, unspecified   . Memory loss   . Other abnormal blood chemistry   . Other specified cardiac dysrhythmias(427.89)   . Other specified disease of nail   . Stroke ([place])   . Syncope and collapse   . Unspecified disorder of kidney and ureter   . Unspecified late effects of cerebrovascular disease   . Unspecified transient cerebral ischemia   . Unspecified urinary incontinence   . Unspecified vitamin D deficiency   . Vascular dementia, uncomplicated    No past surgical history on file. Family History  Problem Relation Age of Onset  . Pneumonia Mother   . Kidney disease Sister   . Hypertension Brother    Social History   Socioeconomic History  . Marital status: Single    Spouse name: None  . Number of children: None  . Years of education: None  . Highest education level: None  Social Needs  . Financial resource strain: Not hard at all  . Food insecurity - worry: Never true  . Food insecurity - inability: Never true  . Transportation needs - medical: No  .  Transportation needs - non-medical: No  Occupational History  . None  Tobacco Use  . Smoking status: Former Smoker    Years: 30.00  . Smokeless tobacco: Never Used  . Tobacco comment: Quit about 7-8 years ago as of 2015   Substance and Sexual Activity  . Alcohol use: No  . Drug use: No  . Sexual activity: No  Other Topics Concern  . None  Social History Narrative  . None    Outpatient Encounter Medications as of 12/25/2017  Medication Sig  . albuterol (PROVENTIL HFA;VENTOLIN HFA) 108 (90 BASE) MCG/ACT inhaler Inhale 2 puffs into the lungs every 6 (six) hours as needed for wheezing or shortness of breath.  Marland Kitchen amLODipine (NORVASC) 10 MG tablet TAKE 1 TABLET ONCE DAILY FOR BLOOD PRESSURE  . Camphor-Menthol-Methyl Sal (HM  SALONPAS PAIN RELIEF) 1.2-5.7-6.3 % PTCH Apply 1 patch topically 3 (three) times daily. Leave in place for up to 12 hours.  . hydrALAZINE (APRESOLINE) 25 MG tablet TAKE 1 TABLET THREE TIMES DAILY WITH MEALS TO CONTROL  BLOOD PRESSURE  . losartan (COZAAR) 50 MG tablet TAKE 1 TABLET ONCE DAILY FOR BLOOD PRESSURE  . Memantine HCl-Donepezil HCl (NAMZARIC) 28-10 MG CP24 Use as directed 1 capsule in the mouth or throat daily.  Marland Kitchen oxybutynin (DITROPAN) 5 MG tablet TAKE 1 TABLET ONE TIME DAILY FOR BLADDER CONTROL  . simvastatin (ZOCOR) 40 MG tablet TAKE 1 TABLET (40 MG TOTAL) BY MOUTH DAILY AT 6PM.   No facility-administered encounter medications on file as of 12/25/2017.     Activities of Daily Living In your present state of health, do you have any difficulty performing the following activities: 12/25/2017  Hearing? N  Vision? Y  Difficulty concentrating or making decisions? Y  Walking or climbing stairs? Y  Dressing or bathing? Y  Doing errands, shopping? Y  Preparing Food and eating ? N  Using the Toilet? N  In the past six months, have you accidently leaked urine? N  Do you have problems with loss of bowel control? N  Managing your Finances? Y  Housekeeping or managing your Housekeeping? Y  Some recent data might be hidden    Patient Care Team: [name], DO as PCP - General (Geriatric Medicine) [name], MD as Consulting Physician (Ophthalmology)    Assessment:   This is a routine wellness examination for [place].  Exercise Activities and Dietary recommendations Current Exercise Habits: The patient does not participate in regular exercise at present, Exercise limited by: None identified  Goals    . Exercise 3x per week (30 min per time)     Patient would like to walk for at least 20 minutes a day inside or outside the house       Fall Risk Fall Risk  12/25/2017 04/24/2017 03/03/2016 08/06/2015 08/04/2014  Falls in the past year? No No No Yes No  Number falls in past yr:  - - - 2 or more -  Injury with Fall? - - - No -  Risk Factor Category  - - - High Fall Risk -  Risk for fall due to : - - - History of fall(s);Impaired balance/gait;Mental status change;Medication side effect;Impaired vision -  Follow up - - - Falls evaluation completed;Education provided;Falls prevention discussed -   Is the patient's home free of loose throw rugs in walkways, pet beds, electrical cords, etc?   no      Grab bars in the bathroom? yes      Handrails on the  stairs?   yes      Adequate lighting?   yes  Timed Get Up and Go performed: 21 seconds, fall risk  Depression Screen PHQ 2/9 Scores 12/25/2017 04/24/2017 03/03/2016 08/06/2015  PHQ - 2 Score 0 0 0 0     Cognitive Function MMSE - Mini Mental State Exam 12/25/2017 09/11/2016 08/06/2015  Orientation to time 4 3 5   Orientation to Place 4 4 3   Registration 3 3 3   Attention/ Calculation 5 5 5   Recall 0 0 0  Language- name 2 objects 2 2 2   Language- repeat 1 1 1   Language- follow 3 step command 3 3 3   Language- read & follow direction 1 1 1   Write a sentence 1 0 1  Copy design 0 0 0  Total score 24 22 24         Immunization History  Administered Date(s) Administered  . Influenza,inj,Quad PF,6+ Mos 10/17/2013, 12/07/2015, 09/01/2016  . Influenza-Unspecified 08/25/2008, 09/12/2011, 09/27/2012  . Pneumococcal Conjugate-13 08/04/2014  . Pneumococcal-Unspecified 05/07/2011  . Tdap 05/07/2011    Qualifies for Shingles Vaccine? Yes, educated and has prescription  Screening Tests Health Maintenance  Topic Date Due  . Hepatitis C Screening  [birth year]  . COLONOSCOPY  04/20/1994  . INFLUENZA VACCINE  07/08/2017  . MAMMOGRAM  10/06/2019  . TETANUS/TDAP  05/06/2021  . DEXA SCAN  Completed  . PNA vac Low Risk Adult  Completed    Cancer Screenings: Lung: Low Dose CT Chest recommended if Age 16-80 years, 30 pack-year currently smoking OR have quit w/in 15years. Patient does not qualify. Breast:  Up to date on  Mammogram? Yes   Up to date of Bone Density/Dexa? Yes Colorectal: up to date  Additional Screenings:  Hepatitis B/HIV/Syphillis: declined Hepatitis C Screening: declined     Plan:  I have personally reviewed and addressed the Medicare Annual Wellness questionnaire and have noted the following in the patient's chart:  A. Medical and social history B. Use of alcohol, tobacco or illicit drugs  C. Current medications and supplements D. Functional ability and status E.  Nutritional status F.  Physical activity G. Advance directives H. List of other physicians I.  Hospitalizations, surgeries, and ER visits in previous 12 months J.  [place] to include hearing, vision, cognitive, depression L. Referrals and appointments - none  In addition, I have reviewed and discussed with patient certain preventive protocols, quality metrics, and best practice recommendations. A written personalized care plan for preventive services as well as general preventive health recommendations were provided to patient.  See attached scanned questionnaire for additional information.   Signed,   [name], RN Nurse Health Advisor   Quick Notes   Health Maintenance: Flu shot given. Cologuard done in december     Abnormal Screen: MMSE 24/30. Did not pass.     Patient Concerns: None     Nurse Concerns: None

## 2017-12-25 NOTE — Patient Instructions (Signed)
[name] , Thank you for taking time to come for your Medicare Wellness Visit. I appreciate your ongoing commitment to your health goals. Please review the following plan we discussed and let me know if I can assist you in the future.   Screening recommendations/referrals: Colonoscopy/Cologuard up to date Mammogram up to date. Due 10/06/2019 Bone Density up to date Recommended yearly ophthalmology/optometry visit for glaucoma screening and checkup Recommended yearly dental visit for hygiene and checkup  Vaccinations: Influenza vaccine up to date, given today Pneumococcal vaccine up to date Tdap vaccine up to date. Due 05/06/2021 Shingles vaccine due    Advanced directives: Please bring [name] a copy of your living will and health care power of attorney  Conditions/risks identified: none  Next appointment: [name], RN 12/29/2018 @ 8:30am   Preventive Care 65 Years and Older, Female Preventive care refers to lifestyle choices and visits with your health care provider that can promote health and wellness. What does preventive care include?  A yearly physical exam. This is also called an annual well check.  Dental exams once or twice a year.  Routine eye exams. Ask your health care provider how often you should have your eyes checked.  Personal lifestyle choices, including:  Daily care of your teeth and gums.  Regular physical activity.  Eating a healthy diet.  Avoiding tobacco and drug use.  Limiting alcohol use.  Practicing safe sex.  Taking low-dose aspirin every day.  Taking vitamin and mineral supplements as recommended by your health care provider. What happens during an annual well check? The services and screenings done by your health care provider during your annual well check will depend on your age, overall health, lifestyle risk factors, and family history of disease. Counseling  Your health care provider may ask you questions about your:  Alcohol  use.  Tobacco use.  Drug use.  Emotional well-being.  Home and relationship well-being.  Sexual activity.  Eating habits.  History of falls.  Memory and ability to understand (cognition).  Work and work Statistician.  Reproductive health. Screening  You may have the following tests or measurements:  Height, weight, and BMI.  Blood pressure.  Lipid and cholesterol levels. These may be checked every 5 years, or more frequently if you are over 59 years old.  Skin check.  Lung cancer screening. You may have this screening every year starting at age 42 if you have a 30-pack-year history of smoking and currently smoke or have quit within the past 15 years.  Fecal occult blood test (FOBT) of the stool. You may have this test every year starting at age 14.  Flexible sigmoidoscopy or colonoscopy. You may have a sigmoidoscopy every 5 years or a colonoscopy every 10 years starting at age 21.  Hepatitis C blood test.  Hepatitis B blood test.  Sexually transmitted disease (STD) testing.  Diabetes screening. This is done by checking your blood sugar (glucose) after you have not eaten for a while (fasting). You may have this done every 1-3 years.  Bone density scan. This is done to screen for osteoporosis. You may have this done starting at age 52.  Mammogram. This may be done every 1-2 years. Talk to your health care provider about how often you should have regular mammograms. Talk with your health care provider about your test results, treatment options, and if necessary, the need for more tests. Vaccines  Your health care provider may recommend certain vaccines, such as:  Influenza vaccine. This is recommended  every year.  Tetanus, diphtheria, and acellular pertussis (Tdap, Td) vaccine. You may need a Td booster every 10 years.  Zoster vaccine. You may need this after age 21.  Pneumococcal 13-valent conjugate (PCV13) vaccine. One dose is recommended after age  23.  Pneumococcal polysaccharide (PPSV23) vaccine. One dose is recommended after age 30. Talk to your health care provider about which screenings and vaccines you need and how often you need them. This information is not intended to replace advice given to you by your health care provider. Make sure you discuss any questions you have with your health care provider. Document Released: 12/21/2015 Document Revised: 08/13/2016 Document Reviewed: 09/25/2015 Elsevier Interactive Patient Education  2017 [place] Prevention in the Home Falls can cause injuries. They can happen to people of all ages. There are many things you can do to make your home safe and to help prevent falls. What can I do on the outside of my home?  Regularly fix the edges of walkways and driveways and fix any cracks.  Remove anything that might make you trip as you walk through a door, such as a raised step or threshold.  Trim any bushes or trees on the path to your home.  Use bright outdoor lighting.  Clear any walking paths of anything that might make someone trip, such as rocks or tools.  Regularly check to see if handrails are loose or broken. Make sure that both sides of any steps have handrails.  Any raised decks and porches should have guardrails on the edges.  Have any leaves, snow, or ice cleared regularly.  Use sand or salt on walking paths during winter.  Clean up any spills in your garage right away. This includes oil or grease spills. What can I do in the bathroom?  Use night lights.  Install grab bars by the toilet and in the tub and shower. Do not use towel bars as grab bars.  Use non-skid mats or decals in the tub or shower.  If you need to sit down in the shower, use a plastic, non-slip stool.  Keep the floor dry. Clean up any water that spills on the floor as soon as it happens.  Remove soap buildup in the tub or shower regularly.  Attach bath mats securely with double-sided  non-slip rug tape.  Do not have throw rugs and other things on the floor that can make you trip. What can I do in the bedroom?  Use night lights.  Make sure that you have a light by your bed that is easy to reach.  Do not use any sheets or blankets that are too big for your bed. They should not hang down onto the floor.  Have a firm chair that has side arms. You can use this for support while you get dressed.  Do not have throw rugs and other things on the floor that can make you trip. What can I do in the kitchen?  Clean up any spills right away.  Avoid walking on wet floors.  Keep items that you use a lot in easy-to-reach places.  If you need to reach something above you, use a strong step stool that has a grab bar.  Keep electrical cords out of the way.  Do not use floor polish or wax that makes floors slippery. If you must use wax, use non-skid floor wax.  Do not have throw rugs and other things on the floor that can make you trip. What  can I do with my stairs?  Do not leave any items on the stairs.  Make sure that there are handrails on both sides of the stairs and use them. Fix handrails that are broken or loose. Make sure that handrails are as long as the stairways.  Check any carpeting to make sure that it is firmly attached to the stairs. Fix any carpet that is loose or worn.  Avoid having throw rugs at the top or bottom of the stairs. If you do have throw rugs, attach them to the floor with carpet tape.  Make sure that you have a light switch at the top of the stairs and the bottom of the stairs. If you do not have them, ask someone to add them for you. What else can I do to help prevent falls?  Wear shoes that:  Do not have high heels.  Have rubber bottoms.  Are comfortable and fit you well.  Are closed at the toe. Do not wear sandals.  If you use a stepladder:  Make sure that it is fully opened. Do not climb a closed stepladder.  Make sure that both  sides of the stepladder are locked into place.  Ask someone to hold it for you, if possible.  Clearly mark and make sure that you can see:  Any grab bars or handrails.  First and last steps.  Where the edge of each step is.  Use tools that help you move around (mobility aids) if they are needed. These include:  Canes.  Walkers.  Scooters.  Crutches.  Turn on the lights when you go into a dark area. Replace any light bulbs as soon as they burn out.  Set up your furniture so you have a clear path. Avoid moving your furniture around.  If any of your floors are uneven, fix them.  If there are any pets around you, be aware of where they are.  Review your medicines with your doctor. Some medicines can make you feel dizzy. This can increase your chance of falling. Ask your doctor what other things that you can do to help prevent falls. This information is not intended to replace advice given to you by your health care provider. Make sure you discuss any questions you have with your health care provider. Document Released: 09/20/2009 Document Revised: 05/01/2016 Document Reviewed: 12/29/2014 Elsevier Interactive Patient Education  2017 [employer].

## 2017-12-25 NOTE — Addendum Note (Signed)
Addended by: Despina Hidden on: 12/25/2017 10:10 AM   Modules accepted: Orders

## 2017-12-25 NOTE — Addendum Note (Signed)
Addended by: Despina Hidden on: 12/25/2017 11:02 AM   Modules accepted: Orders

## 2017-12-25 NOTE — Addendum Note (Signed)
Addended by: Despina Hidden on: 12/25/2017 10:31 AM   Modules accepted: Orders

## 2017-12-26 LAB — COMPREHENSIVE METABOLIC PANEL
AG Ratio: 1.3 (calc) (ref 1.0–2.5)
ALT: 15 U/L (ref 6–29)
AST: 16 U/L (ref 10–35)
Albumin: 4.1 g/dL (ref 3.6–5.1)
Alkaline phosphatase (APISO): 100 U/L (ref 33–130)
BUN/Creatinine Ratio: 14 (calc) (ref 6–22)
BUN: 19 mg/dL (ref 7–25)
CO2: 26 mmol/L (ref 20–32)
Calcium: 9.5 mg/dL (ref 8.6–10.4)
Chloride: 107 mmol/L (ref 98–110)
Creat: 1.39 mg/dL — ABNORMAL HIGH (ref 0.60–0.93)
Globulin: 3.1 g/dL (calc) (ref 1.9–3.7)
Glucose, Bld: 103 mg/dL — ABNORMAL HIGH (ref 65–99)
Potassium: 4.1 mmol/L (ref 3.5–5.3)
Sodium: 141 mmol/L (ref 135–146)
Total Bilirubin: 0.6 mg/dL (ref 0.2–1.2)
Total Protein: 7.2 g/dL (ref 6.1–8.1)

## 2017-12-26 LAB — LIPID PANEL
Cholesterol: 167 mg/dL (ref <200)
HDL: 80 mg/dL (ref >50)
LDL Cholesterol (Calc): 74 mg/dL (calc)
Non-HDL Cholesterol (Calc): 87 mg/dL (calc) (ref <130)
Total CHOL/HDL Ratio: 2.1 (calc) (ref <5.0)
Triglycerides: 54 mg/dL (ref <150)

## 2017-12-26 LAB — HEMOGLOBIN A1C
Hgb A1c MFr Bld: 5.7 % of total Hgb — ABNORMAL HIGH (ref <5.7)
Mean Plasma Glucose: 117 (calc)
eAG (mmol/L): 6.5 (calc)

## 2017-12-28 ENCOUNTER — Encounter: Payer: Self-pay | Admitting: *Deleted

## 2017-12-31 ENCOUNTER — Encounter: Payer: Self-pay | Admitting: Internal Medicine

## 2017-12-31 ENCOUNTER — Ambulatory Visit: Payer: Self-pay

## 2018-01-01 ENCOUNTER — Encounter: Payer: Self-pay | Admitting: *Deleted

## 2018-01-07 ENCOUNTER — Telehealth: Payer: Self-pay | Admitting: *Deleted

## 2018-01-07 ENCOUNTER — Other Ambulatory Visit: Payer: Self-pay | Admitting: Internal Medicine

## 2018-01-07 DIAGNOSIS — R195 Other fecal abnormalities: Secondary | ICD-10-CM

## 2018-01-07 NOTE — Telephone Encounter (Signed)
Spoke with daughter and advised pt had a positive cologuard and needs to have a colonoscopy, advised that [facility] or [facility] will call her to set up that appt.

## 2018-01-08 ENCOUNTER — Encounter: Payer: Self-pay | Admitting: Internal Medicine

## 2018-01-11 ENCOUNTER — Other Ambulatory Visit: Payer: Self-pay | Admitting: Internal Medicine

## 2018-01-25 ENCOUNTER — Encounter: Payer: Self-pay | Admitting: Internal Medicine

## 2018-01-25 ENCOUNTER — Ambulatory Visit (INDEPENDENT_AMBULATORY_CARE_PROVIDER_SITE_OTHER): Payer: Medicare HMO | Admitting: Internal Medicine

## 2018-01-25 VITALS — BP 120/80 | HR 92 | Temp 98.3°F | Ht 64.0 in | Wt 260.0 lb

## 2018-01-25 DIAGNOSIS — Z23 Encounter for immunization: Secondary | ICD-10-CM | POA: Diagnosis not present

## 2018-01-25 DIAGNOSIS — Z0000 Encounter for general adult medical examination without abnormal findings: Secondary | ICD-10-CM

## 2018-01-25 DIAGNOSIS — Z6841 Body Mass Index (BMI) 40.0 and over, adult: Secondary | ICD-10-CM

## 2018-01-25 DIAGNOSIS — N3941 Urge incontinence: Secondary | ICD-10-CM

## 2018-01-25 DIAGNOSIS — N182 Chronic kidney disease, stage 2 (mild): Secondary | ICD-10-CM | POA: Diagnosis not present

## 2018-01-25 DIAGNOSIS — R739 Hyperglycemia, unspecified: Secondary | ICD-10-CM

## 2018-01-25 DIAGNOSIS — E7800 Pure hypercholesterolemia, unspecified: Secondary | ICD-10-CM

## 2018-01-25 DIAGNOSIS — F0150 Vascular dementia without behavioral disturbance: Secondary | ICD-10-CM | POA: Diagnosis not present

## 2018-01-25 DIAGNOSIS — I10 Essential (primary) hypertension: Secondary | ICD-10-CM

## 2018-01-25 DIAGNOSIS — I491 Atrial premature depolarization: Secondary | ICD-10-CM | POA: Diagnosis not present

## 2018-01-25 MED ORDER — ZOSTER VAC RECOMB ADJUVANTED 50 MCG/0.5ML IM SUSR: 0.5000 mL | Freq: Once | INTRAMUSCULAR | 1 refills | Status: AC

## 2018-01-25 NOTE — Patient Instructions (Addendum)
Decrease your portion sizes.  Try to eat more vegetables at your meals and less starchy foods like pasta, rice, bread, potatoes.   Try to walk around your home more.  Try using a pedometer or fitbit to track your steps to help burn calories.  Obesity, Adult Obesity is having too much body fat. If you have a BMI of 30 or more, you are obese. BMI is a number that explains how much body fat you have. Obesity is often caused by taking in (consuming) more calories than your body uses. Obesity can cause serious health problems. Changing your lifestyle can help to treat obesity. Follow these instructions at home: Eating and drinking   Follow advice from your doctor about what to eat and drink. Your doctor may tell you to: ? Cut down on (limit) fast foods, sweets, and processed snack foods. ? Choose low-fat options. For example, choose low-fat milk instead of whole milk. ? Eat 5 or more servings of fruits or vegetables every day. ? Eat at home more often. This gives you more control over what you eat. ? Choose healthy foods when you eat out. ? Learn what a healthy portion size is. A portion size is the amount of a certain food that is healthy for you to eat at one time. This is different for each person. ? Keep low-fat snacks available. ? Avoid sugary drinks. These include soda, fruit juice, iced tea that is sweetened with sugar, and flavored milk. ? Eat a healthy breakfast.  Drink enough water to keep your pee (urine) clear or pale yellow.  Do not go without eating for long periods of time (do not fast).  Do not go on popular or trendy diets (fad diets). Physical Activity  Exercise often, as told by your doctor. Ask your doctor: ? What types of exercise are safe for you. ? How often you should exercise.  Warm up and stretch before being active.  Do slow stretching after being active (cool down).  Rest between times of being active. Lifestyle  Limit how much time you spend in front of  your TV, computer, or video game system (be less sedentary).  Find ways to reward yourself that do not involve food.  Limit alcohol intake to no more than 1 drink a day for nonpregnant women and 2 drinks a day for men. One drink equals 12 oz of beer, 5 oz of wine, or 1 oz of hard liquor. General instructions  Keep a weight loss journal. This can help you keep track of: ? The food that you eat. ? The exercise that you do.  Take over-the-counter and prescription medicines only as told by your doctor.  Take vitamins and supplements only as told by your doctor.  Think about joining a support group. Your doctor may be able to help with this.  Keep all follow-up visits as told by your doctor. This is important. Contact a doctor if:  You cannot meet your weight loss goal after you have changed your diet and lifestyle for 6 weeks. This information is not intended to replace advice given to you by your health care provider. Make sure you discuss any questions you have with your health care provider. Document Released: 02/16/2012 Document Revised: 05/01/2016 Document Reviewed: 09/12/2015 Elsevier Interactive Patient Education  2018 [employer].

## 2018-01-25 NOTE — Progress Notes (Signed)
Provider:  Rexene Edison. Mariea Clonts, D.O., C.M.D. Location:   [facility]  Place of Service:   clinic  Previous PCP: [name], DO Patient Care Team: [name], DO as PCP - General (Geriatric Medicine) [name], MD as Consulting Physician (Ophthalmology)  Extended Emergency Contact Information Primary Emergency Contact: Hally,[name] Address: [address], [place] [address] [name] of [place] Phone: [phone] Mobile Phone: [phone] Relation: Daughter  Code Status: DNR                      Goals of Care: Advanced Directive information Advanced Directives 12/25/2017  Does Patient Have a Medical Advance Directive? Yes  Type of Advance Directive [place]  Does patient want to make changes to medical advance directive? No - Patient declined  Copy of [place] in Chart? No - copy requested  Would patient like information on creating a medical advance directive? -  Pre-existing out of facility DNR order (yellow form or pink MOST form) -      Chief Complaint  Patient presents with  . Annual Exam    CPE    HPI: Patient is a 72 y.o. female seen today for an annual physical exam.  Had abnormal cologuard so has her cscope coming up in April.  No pain. Gets around well.  Bladder control has been stable.  No melena or hematochezia.  BP at goal today.    Sugar average trended up, but cholesterol satisfactory.    Taking her vitamin D for her bones.    Had been to urgent care in march with some sprain in her left upper chest/shoulder area--was having pain to put on her shirt and to sleep so got salonpas patches which helped.    Does occasionally wheeze when up and about doing things. She walks some in the house.  Past Medical History:  Diagnosis Date  . Chronic kidney disease   . Hyperlipidemia   . Hypertension   . Insomnia, unspecified   . Memory loss   . Other abnormal blood chemistry   . Other  specified cardiac dysrhythmias(427.89)   . Other specified disease of nail   . Stroke ([place])   . Syncope and collapse   . Unspecified disorder of kidney and ureter   . Unspecified late effects of cerebrovascular disease   . Unspecified transient cerebral ischemia   . Unspecified urinary incontinence   . Unspecified vitamin D deficiency   . Vascular dementia, uncomplicated    No past surgical history on file.  reports that she has quit smoking. She quit after 30.00 years of use. she has never used smokeless tobacco. She reports that she does not drink alcohol or use drugs.  Functional Status Survey:    Family History  Problem Relation Age of Onset  . Pneumonia Mother   . Kidney disease Sister   . Hypertension Brother     Health Maintenance  Topic Date Due  . Hepatitis C Screening  July 01, 1946  . MAMMOGRAM  10/06/2019  . Fecal DNA (Cologuard)  11/27/2020  . TETANUS/TDAP  05/06/2021  . INFLUENZA VACCINE  Completed  . DEXA SCAN  Completed  . PNA vac Low Risk Adult  Completed    No Known Allergies  Outpatient Encounter Medications as of 01/25/2018  Medication Sig  . albuterol (PROVENTIL HFA;VENTOLIN HFA) 108 (90 BASE) MCG/ACT inhaler Inhale 2 puffs into the lungs every 6 (six) hours  as needed for wheezing or shortness of breath.  Marland Kitchen amLODipine (NORVASC) 10 MG tablet TAKE 1 TABLET ONCE DAILY FOR BLOOD PRESSURE  . Camphor-Menthol-Methyl Sal (HM SALONPAS PAIN RELIEF) 1.2-5.7-6.3 % PTCH Apply 1 patch topically 3 (three) times daily. Leave in place for up to 12 hours.  . hydrALAZINE (APRESOLINE) 25 MG tablet TAKE 1 TABLET THREE TIMES DAILY WITH MEALS TO CONTROL  BLOOD PRESSURE  . losartan (COZAAR) 50 MG tablet TAKE 1 TABLET ONCE DAILY FOR BLOOD PRESSURE  . Memantine HCl-Donepezil HCl (NAMZARIC) 28-10 MG CP24 Use as directed 1 capsule in the mouth or throat daily.  Marland Kitchen oxybutynin (DITROPAN) 5 MG tablet TAKE 1 TABLET ONE TIME DAILY FOR BLADDER CONTROL  . simvastatin (ZOCOR) 40 MG tablet  TAKE 1 TABLET (40 MG TOTAL) BY MOUTH DAILY AT 6PM.   No facility-administered encounter medications on file as of 01/25/2018.     Review of Systems  Constitutional: Negative for chills, fever and malaise/fatigue.  HENT: Negative for congestion and hearing loss.   Eyes: Negative for blurred vision.  Respiratory: Positive for shortness of breath and wheezing. Negative for cough and sputum production.   Cardiovascular: Negative for chest pain, palpitations and leg swelling.  Gastrointestinal: Negative for abdominal pain, blood in stool, constipation and melena.  Genitourinary: Negative for dysuria.  Musculoskeletal: Negative for falls and joint pain.  Skin: Negative for itching and rash.  Neurological: Negative for dizziness, loss of consciousness and weakness.  Psychiatric/Behavioral: Positive for memory loss. Negative for depression. The patient is not nervous/anxious and does not have insomnia.     Vitals:   01/25/18 1340  BP: 120/80  Pulse: 92  Temp: 98.3 F (36.8 C)  TempSrc: Oral  SpO2: 97%  Weight: 260 lb (117.9 kg)  Height: 5\' 4"  (1.626 m)   Body mass index is 44.63 kg/m. Physical Exam  Constitutional: She appears well-developed and well-nourished. No distress.  HENT:  Head: Normocephalic and atraumatic.  Right Ear: External ear normal.  Left Ear: External ear normal.  Nose: Nose normal.  Mouth/Throat: Oropharynx is clear and moist. No oropharyngeal exudate.  Increased cerumen, but not impacted  Eyes: Conjunctivae and EOM are normal. Pupils are equal, round, and reactive to light.  Neck: Normal range of motion. Neck supple. No JVD present.  Cardiovascular: Normal rate, regular rhythm, normal heart sounds and intact distal pulses.  Pulmonary/Chest: Effort normal and breath sounds normal. No respiratory distress.  Abdominal: Soft. Bowel sounds are normal. She exhibits no distension and no mass. There is no tenderness. There is no rebound and no guarding.    Musculoskeletal: Normal range of motion.  Difficulty stepping up onto exam table today, then dyspneic afterwards  Lymphadenopathy:    She has no cervical adenopathy.  Neurological: She is alert.  Pt oriented to person  Skin: Skin is warm and dry. Capillary refill takes less than 2 seconds.  Psychiatric: She has a normal mood and affect.    Labs reviewed: Basic Metabolic Panel: Recent Labs    12/25/17 0830  NA 141  K 4.1  CL 107  CO2 26  GLUCOSE 103*  BUN 19  CREATININE 1.39*  CALCIUM 9.5   Liver Function Tests: Recent Labs    12/25/17 0830  AST 16  ALT 15  BILITOT 0.6  PROT 7.2   No results for input(s): LIPASE, AMYLASE in the last 8760 hours. No results for input(s): AMMONIA in the last 8760 hours. CBC: Recent Labs    12/25/17 0830  WBC 9.9  NEUTROABS 4,109  HGB 13.7  HCT 42.4  MCV 85.1  PLT 278   Cardiac Enzymes: No results for input(s): CKTOTAL, CKMB, CKMBINDEX, TROPONINI in the last 8760 hours. BNP: Invalid input(s): POCBNP Lab Results  Component Value Date   HGBA1C 5.7 (H) 12/25/2017   Assessment/Plan 1. Essential hypertension -bp controlled with current regimen - EKG 48-AXKP - Basic metabolic panel; Future  2. Stage 2 chronic kidney disease - stable, cont to hydrate; Avoid nephrotoxic agents like nsaids, dose adjust renally excreted meds, hydrate. - Basic metabolic panel; Future  3. Vascular dementia, uncomplicated -cont support from her daughter at home, lives in room on first floor and does not have to do stairs for any reason fortunately b/c so dyspneic with exertion it was unreal due to deconditioning  4. Hypercholesteremia -cont simvastatin therapy  5. Hyperglycemia - counseled on dietary changes to lower sugar and lose weight - Hemoglobin A1c; Future  6. Urge urinary incontinence -cont oxybutynin as alternatives have been unaffordable  7. BMI 40.0-44.9, adult ([place]) -counseled on diet and exercise regimen  Including fitbit use  and provided info in AVS  8. Morbid obesity with BMI of 40.0-44.9, adult ([place]) -she is morbidly obese   9. Premature atrial contractions -noted on ekg  10. Need for shingles vaccine - Zoster Vaccine Adjuvanted [facility]) injection; Inject 0.5 mLs into the muscle once for 1 dose.  Dispense: 0.5 mL; Refill: 1  11. Annual physical exam -performed today  Labs/tests ordered:   Orders Placed This Encounter  Procedures  . Hemoglobin A1c    Standing Status:   Future    Standing Expiration Date:   01/25/2019  . Basic metabolic panel    Standing Status:   Future    Standing Expiration Date:   01/25/2019    Order Specific Question:   Has the patient fasted?    Answer:   Yes  . EKG 12-Lead    Haylen Shelnutt L. Dannie Woolen, D.O. [facility] [address], [place] [address] Cell Phone (Mon-Fri 8am-5pm):  [phone] On Call:  [phone] & follow prompts after 5pm & weekends Office Phone:  [phone] Office Fax:  [phone]

## 2018-02-09 ENCOUNTER — Encounter: Payer: Self-pay | Admitting: Internal Medicine

## 2018-03-08 ENCOUNTER — Other Ambulatory Visit: Payer: Self-pay

## 2018-03-08 ENCOUNTER — Ambulatory Visit (AMBULATORY_SURGERY_CENTER): Payer: Self-pay | Admitting: *Deleted

## 2018-03-08 VITALS — Ht 63.0 in | Wt 244.0 lb

## 2018-03-08 DIAGNOSIS — R195 Other fecal abnormalities: Secondary | ICD-10-CM

## 2018-03-08 MED ORDER — NA SULFATE-K SULFATE-MG SULF 17.5-3.13-1.6 GM/177ML PO SOLN: 1.0000 | Freq: Once | ORAL | 0 refills | Status: AC

## 2018-03-08 NOTE — Progress Notes (Signed)
No egg or soy allergy known to patient  No past sedation with any surgeries  or procedures, no past  intubation  No diet pills per patient No home 02 use per patient  No blood thinners per patient  Pt denies issues with constipation  No A fib or A flutter  EMMI video sent to pt's e mail -  Daughter in [facility] today with pt. - pt with mild dementia but was able to answer questions appropriately and ask questions as well -

## 2018-03-09 ENCOUNTER — Encounter: Payer: Self-pay | Admitting: Internal Medicine

## 2018-03-22 ENCOUNTER — Ambulatory Visit (AMBULATORY_SURGERY_CENTER): Payer: Medicare HMO | Admitting: Internal Medicine

## 2018-03-22 ENCOUNTER — Other Ambulatory Visit: Payer: Self-pay

## 2018-03-22 ENCOUNTER — Encounter: Payer: Self-pay | Admitting: Internal Medicine

## 2018-03-22 VITALS — BP 134/74 | HR 79 | Temp 97.8°F | Resp 20 | Ht 64.0 in | Wt 260.0 lb

## 2018-03-22 DIAGNOSIS — Z1211 Encounter for screening for malignant neoplasm of colon: Secondary | ICD-10-CM | POA: Diagnosis not present

## 2018-03-22 DIAGNOSIS — Z8673 Personal history of transient ischemic attack (TIA), and cerebral infarction without residual deficits: Secondary | ICD-10-CM | POA: Diagnosis not present

## 2018-03-22 DIAGNOSIS — D123 Benign neoplasm of transverse colon: Secondary | ICD-10-CM | POA: Diagnosis not present

## 2018-03-22 DIAGNOSIS — R195 Other fecal abnormalities: Secondary | ICD-10-CM

## 2018-03-22 DIAGNOSIS — I10 Essential (primary) hypertension: Secondary | ICD-10-CM | POA: Diagnosis not present

## 2018-03-22 MED ORDER — SODIUM CHLORIDE 0.9 % IV SOLN: 500.0000 mL | Freq: Once | INTRAVENOUS | Status: DC

## 2018-03-22 NOTE — Progress Notes (Signed)
Called to room to assist during endoscopic procedure.  Patient ID and intended procedure confirmed with present staff. Received instructions for my participation in the procedure from the performing physician.  

## 2018-03-22 NOTE — Progress Notes (Signed)
Report given to PACU, vss 

## 2018-03-22 NOTE — Progress Notes (Signed)
Pt's states no medical or surgical changes since previsit or office visit. 

## 2018-03-22 NOTE — Op Note (Signed)
[facility] Patient Name: [name] Procedure Date: 03/22/2018 11:02 AM MRN: [id number] Endoscopist: Jerene Bears , MD Age: 72 Referring MD:  Date of Birth: [birth year] Gender: Female Account #: [id number] Procedure:                Colonoscopy Indications:              This is the patient's first colonoscopy, Positive                            Cologuard test Medicines:                Monitored Anesthesia Care Procedure:                Pre-Anesthesia Assessment:                           - Prior to the procedure, a History and Physical                            was performed, and patient medications and                            allergies were reviewed. The patient's tolerance of                            previous anesthesia was also reviewed. The risks                            and benefits of the procedure and the sedation                            options and risks were discussed with the patient.                            All questions were answered, and informed consent                            was obtained. Prior Anticoagulants: The patient has                            taken no previous anticoagulant or antiplatelet                            agents. ASA Grade Assessment: III - A patient with                            severe systemic disease. After reviewing the risks                            and benefits, the patient was deemed in                            satisfactory condition to undergo the procedure.  After obtaining informed consent, the colonoscope                            was passed under direct vision. Throughout the                            procedure, the patient's blood pressure, pulse, and                            oxygen saturations were monitored continuously. The                            Model PCF-[id number]) scope was introduced                            through the anus and advanced to the the  terminal                            ileum. The colonoscopy was performed without                            difficulty. The patient tolerated the procedure                            well. The quality of the bowel preparation was                            good. The ileocecal valve, appendiceal orifice, and                            rectum were photographed. Scope In: 11:10:28 AM Scope Out: 11:21:48 AM Scope Withdrawal Time: 0 hours 9 minutes 12 seconds  Total Procedure Duration: 0 hours 11 minutes 20 seconds  Findings:                 The digital rectal exam was normal.                           The terminal ileum appeared normal.                           A 6 mm polyp was found in the proximal transverse                            colon. The polyp was sessile. The polyp was removed                            with a cold snare. Resection and retrieval were                            complete.                           A few small-mouthed diverticula were found in the  sigmoid colon.                           The exam was otherwise without abnormality on                            direct and retroflexion views. Complications:            No immediate complications. Estimated Blood Loss:     Estimated blood loss was minimal. Impression:               - The examined portion of the ileum was normal.                           - One 6 mm polyp in the proximal transverse colon,                            removed with a cold snare. Resected and retrieved.                           - Diverticulosis in the sigmoid colon.                           - The examination was otherwise normal on direct                            and retroflexion views. Recommendation:           - Patient has a contact number available for                            emergencies. The signs and symptoms of potential                            delayed complications were discussed with the                             patient. Return to normal activities tomorrow.                            Written discharge instructions were provided to the                            patient.                           - Resume previous diet.                           - Continue present medications.                           - Await pathology results.                           - Repeat colonoscopy is recommended. The  colonoscopy date will be determined after pathology                            results from today's exam become available for                            review. Jerene Bears, MD 03/22/2018 11:24:20 AM This report has been signed electronically.

## 2018-03-22 NOTE — Patient Instructions (Signed)
YOU HAD AN ENDOSCOPIC PROCEDURE TODAY AT THE [facility]:   Refer to the procedure report that was given to you for any specific questions about what was found during the examination.  If the procedure report does not answer your questions, please call your gastroenterologist to clarify.  If you requested that your care partner not be given the details of your procedure findings, then the procedure report has been included in a sealed envelope for you to review at your convenience later.  YOU SHOULD EXPECT: Some feelings of bloating in the abdomen. Passage of more gas than usual.  Walking can help get rid of the air that was put into your GI tract during the procedure and reduce the bloating. If you had a lower endoscopy (such as a colonoscopy or flexible sigmoidoscopy) you may notice spotting of blood in your stool or on the toilet paper. If you underwent a bowel prep for your procedure, you may not have a normal bowel movement for a few days.  Please Note:  You might notice some irritation and congestion in your nose or some drainage.  This is from the oxygen used during your procedure.  There is no need for concern and it should clear up in a day or so.  SYMPTOMS TO REPORT IMMEDIATELY:   Following lower endoscopy (colonoscopy or flexible sigmoidoscopy):  Excessive amounts of blood in the stool  Significant tenderness or worsening of abdominal pains  Swelling of the abdomen that is new, acute  Fever of 100F or higher    For urgent or emergent issues, a gastroenterologist can be reached at any hour by calling [phone].   DIET:  We do recommend a small meal at first, but then you may proceed to your regular diet.  Drink plenty of fluids but you should avoid alcoholic beverages for 24 hours.  ACTIVITY:  You should plan to take it easy for the rest of today and you should NOT DRIVE or use heavy machinery until tomorrow (because of the sedation medicines used during the test).     FOLLOW UP: Our staff will call the number listed on your records the next business day following your procedure to check on you and address any questions or concerns that you may have regarding the information given to you following your procedure. If we do not reach you, we will leave a message.  However, if you are feeling well and you are not experiencing any problems, there is no need to return our call.  We will assume that you have returned to your regular daily activities without incident.  If any biopsies were taken you will be contacted by phone or by letter within the next 1-3 weeks.  Please call us at [phone] if you have not heard about the biopsies in 3 weeks.    SIGNATURES/CONFIDENTIALITY: You and/or your care partner have signed paperwork which will be entered into your electronic medical record.  These signatures attest to the fact that that the information above on your After Visit Summary has been reviewed and is understood.  Full responsibility of the confidentiality of this discharge information lies with you and/or your care-partner.    Handouts were given to your care partner on polyps and diverticulosis. You may resume your current medications today. Await biopsy results. Please call if any questions or concerns.   

## 2018-03-22 NOTE — Progress Notes (Signed)
No problems noted in the recovery room. maw 

## 2018-03-23 ENCOUNTER — Telehealth: Payer: Self-pay | Admitting: *Deleted

## 2018-03-23 NOTE — Telephone Encounter (Signed)
  Follow up Call-  Call back number 03/22/2018  Post procedure Call Back phone  # [phone]  Permission to leave phone message Yes  Some recent data might be hidden     Patient questions:  Do you have a fever, pain , or abdominal swelling? No. Pain Score  0 *  Have you tolerated food without any problems? Yes.    Have you been able to return to your normal activities? Yes.    Do you have any questions about your discharge instructions: Diet   No. Medications  No. Follow up visit  No.  Do you have questions or concerns about your Care? No.  Actions: * If pain score is 4 or above: No action needed, pain <4.

## 2018-03-23 NOTE — Telephone Encounter (Signed)
No answer for post procedure call back. Number was invalid, no ring. SM

## 2018-03-29 ENCOUNTER — Encounter: Payer: Self-pay | Admitting: Internal Medicine

## 2018-04-30 DIAGNOSIS — H401231 Low-tension glaucoma, bilateral, mild stage: Secondary | ICD-10-CM | POA: Diagnosis not present

## 2018-05-01 ENCOUNTER — Other Ambulatory Visit: Payer: Self-pay | Admitting: Internal Medicine

## 2018-06-28 ENCOUNTER — Other Ambulatory Visit: Payer: Self-pay | Admitting: Internal Medicine

## 2018-08-02 ENCOUNTER — Other Ambulatory Visit: Payer: Medicare HMO

## 2018-08-02 DIAGNOSIS — I10 Essential (primary) hypertension: Secondary | ICD-10-CM

## 2018-08-02 DIAGNOSIS — N182 Chronic kidney disease, stage 2 (mild): Secondary | ICD-10-CM | POA: Diagnosis not present

## 2018-08-02 DIAGNOSIS — R739 Hyperglycemia, unspecified: Secondary | ICD-10-CM | POA: Diagnosis not present

## 2018-08-03 LAB — BASIC METABOLIC PANEL
BUN/Creatinine Ratio: 16 (calc) (ref 6–22)
BUN: 22 mg/dL (ref 7–25)
CO2: 28 mmol/L (ref 20–32)
Calcium: 9.5 mg/dL (ref 8.6–10.4)
Chloride: 106 mmol/L (ref 98–110)
Creat: 1.39 mg/dL — ABNORMAL HIGH (ref 0.60–0.93)
Glucose, Bld: 106 mg/dL (ref 65–139)
Potassium: 4.1 mmol/L (ref 3.5–5.3)
Sodium: 142 mmol/L (ref 135–146)

## 2018-08-03 LAB — HEMOGLOBIN A1C
Hgb A1c MFr Bld: 5.5 % of total Hgb (ref <5.7)
Mean Plasma Glucose: 111 (calc)
eAG (mmol/L): 6.2 (calc)

## 2018-08-12 ENCOUNTER — Encounter: Payer: Self-pay | Admitting: *Deleted

## 2018-08-12 ENCOUNTER — Ambulatory Visit: Payer: Medicare HMO | Admitting: Internal Medicine

## 2018-08-19 ENCOUNTER — Encounter: Payer: Self-pay | Admitting: Internal Medicine

## 2018-08-19 ENCOUNTER — Ambulatory Visit (INDEPENDENT_AMBULATORY_CARE_PROVIDER_SITE_OTHER): Payer: Medicare HMO | Admitting: Internal Medicine

## 2018-08-19 VITALS — BP 128/60 | HR 76 | Temp 98.2°F | Ht 64.0 in | Wt 265.0 lb

## 2018-08-19 DIAGNOSIS — Z23 Encounter for immunization: Secondary | ICD-10-CM

## 2018-08-19 DIAGNOSIS — N182 Chronic kidney disease, stage 2 (mild): Secondary | ICD-10-CM

## 2018-08-19 DIAGNOSIS — E7800 Pure hypercholesterolemia, unspecified: Secondary | ICD-10-CM | POA: Diagnosis not present

## 2018-08-19 DIAGNOSIS — F0150 Vascular dementia without behavioral disturbance: Secondary | ICD-10-CM

## 2018-08-19 DIAGNOSIS — N3941 Urge incontinence: Secondary | ICD-10-CM | POA: Diagnosis not present

## 2018-08-19 DIAGNOSIS — I10 Essential (primary) hypertension: Secondary | ICD-10-CM

## 2018-08-19 DIAGNOSIS — R739 Hyperglycemia, unspecified: Secondary | ICD-10-CM

## 2018-08-19 NOTE — Addendum Note (Signed)
Addended by: Despina Hidden on: 08/19/2018 03:55 PM   Modules accepted: Orders

## 2018-08-19 NOTE — Progress Notes (Signed)
Location:  [facility] clinic Provider:  Wiley Magan [name], D.O., C.M.D.  Code Status: DNR Goals of Care:  Advanced Directives 03/22/2018  Does Patient Have a Medical Advance Directive? Yes  Type of Advance Directive [place]  Does patient want to make changes to medical advance directive? -  Copy of [place] in Chart? -  Would patient like information on creating a medical advance directive? -  Pre-existing out of facility DNR order (yellow form or pink MOST form) -     Chief Complaint  Patient presents with  . Medical Management of Chronic Issues    52mth follow-up    HPI: Patient is a 72 y.o. female seen today for medical management of chronic diseases.    Same old.  No new things per [place] and her daughter, [name].    She needs to be more active.  She's not as steady on her feet.  No falls.  Not dizzy.  Watches tv, prays, does walk around the house a little.   Urge incontinence persists.   Regular with bms BP is at goal..   Sleeps well at night.   No pain.    Agrees to flu shot today.  Sugar average has improved.  Does need to lose a lot of weight.  Talked about weight loss of 1-2 lbs per week with decreased portions, low fat diet and walking around in her home more--appears she's not b/c she got very dyspneic just walking down the hall.    Past Medical History:  Diagnosis Date  . Cataract    slow growing   . Chronic kidney disease   . Hyperlipidemia   . Hypertension   . Insomnia, unspecified   . Memory loss    mild dementia per daughter   . Osteopenia   . Other abnormal blood chemistry   . Other specified cardiac dysrhythmias(427.89)   . Other specified disease of nail   . Stroke [facility])    2006 or 2007 per daughter   . Syncope and collapse   . Unspecified disorder of kidney and ureter   . Unspecified late effects of cerebrovascular disease   . Unspecified transient cerebral ischemia   . Unspecified urinary incontinence   .  Unspecified vitamin D deficiency   . Vascular dementia, uncomplicated     Past Surgical History:  Procedure Laterality Date  . NO PAST SURGERIES      No Known Allergies  Outpatient Encounter Medications as of 08/19/2018  Medication Sig  . amLODipine (NORVASC) 10 MG tablet TAKE 1 TABLET ONCE DAILY FOR BLOOD PRESSURE  . aspirin EC 81 MG tablet Take 81 mg by mouth daily.  . cholecalciferol (VITAMIN D) 1000 units tablet Take 1,000 Units by mouth daily.  . hydrALAZINE (APRESOLINE) 25 MG tablet TAKE 1 TABLET THREE TIMES DAILY WITH MEALS TO CONTROL  BLOOD PRESSURE  . losartan (COZAAR) 50 MG tablet TAKE 1 TABLET ONCE DAILY FOR BLOOD PRESSURE  . Multiple Vitamin (MULTIVITAMIN) tablet Take 1 tablet by mouth daily.  Marland Kitchen NAMZARIC 28-10 MG CP24 TAKE 1 CAPSULE  DAILY TO PRESERVE MEMORY  . oxybutynin (DITROPAN) 5 MG tablet TAKE 1 TABLET ONE TIME DAILY FOR BLADDER CONTROL  . simvastatin (ZOCOR) 40 MG tablet TAKE 1 TABLET (40 MG TOTAL) BY MOUTH DAILY AT 6PM.  . [DISCONTINUED] albuterol (PROVENTIL HFA;VENTOLIN HFA) 108 (90 BASE) MCG/ACT inhaler Inhale 2 puffs into the lungs every 6 (six) hours as needed for wheezing or shortness of breath. (Patient not  taking: Reported on 03/08/2018)  . [DISCONTINUED] Camphor-Menthol-Methyl Sal (HM SALONPAS PAIN RELIEF) 1.2-5.7-6.3 % PTCH Apply 1 patch topically 3 (three) times daily. Leave in place for up to 12 hours. (Patient not taking: Reported on 03/08/2018)   Facility-Administered Encounter Medications as of 08/19/2018  Medication  . 0.9 %  sodium chloride infusion    Review of Systems:  Review of Systems  Constitutional: Negative for chills and fever.  HENT: Negative for congestion.   Eyes: Negative for blurred vision.  Respiratory: Positive for shortness of breath.        Dyspnea on exertion  Cardiovascular: Negative for chest pain, palpitations and leg swelling.  Gastrointestinal: Negative for abdominal pain, blood in stool, constipation, diarrhea and melena.    Genitourinary: Positive for frequency and urgency. Negative for dysuria, flank pain and hematuria.       Chronic urge incontinence  Musculoskeletal: Negative for falls and joint pain.  Neurological: Negative for dizziness, loss of consciousness and weakness.  Psychiatric/Behavioral: Positive for memory loss. Negative for depression. The patient is not nervous/anxious and does not have insomnia.     Health Maintenance  Topic Date Due  . Hepatitis C Screening  [birth year]  . INFLUENZA VACCINE  07/08/2018  . MAMMOGRAM  10/06/2019  . Fecal DNA (Cologuard)  11/27/2020  . TETANUS/TDAP  05/06/2021  . DEXA SCAN  Completed  . PNA vac Low Risk Adult  Completed    Physical Exam: Vitals:   08/19/18 1447  BP: 128/60  Pulse: 76  Temp: 98.2 F (36.8 C)  TempSrc: Oral  SpO2: 95%  Weight: 265 lb (120.2 kg)  Height: 5\' 4"  (1.626 m)   Body mass index is 45.49 kg/m. Physical Exam  Constitutional: She is oriented to person, place, and time. She appears well-developed and well-nourished. No distress.  Cardiovascular: Normal rate, regular rhythm, normal heart sounds and intact distal pulses.  Pulmonary/Chest: Effort normal and breath sounds normal. No respiratory distress.  Abdominal: Soft. Bowel sounds are normal.  Musculoskeletal:  Gait less steady, but appears to worsen as she gets sob  Neurological: She is alert and oriented to person, place, and time.  Skin: Skin is warm and dry. Capillary refill takes less than 2 seconds.  Psychiatric: She has a normal mood and affect.    Labs reviewed: Basic Metabolic Panel: Recent Labs    12/25/17 0830 08/02/18 0859  NA 141 142  K 4.1 4.1  CL 107 106  CO2 26 28  GLUCOSE 103* 106  BUN 19 22  CREATININE 1.39* 1.39*  CALCIUM 9.5 9.5   Liver Function Tests: Recent Labs    12/25/17 0830  AST 16  ALT 15  BILITOT 0.6  PROT 7.2   No results for input(s): LIPASE, AMYLASE in the last 8760 hours. No results for input(s): AMMONIA in the  last 8760 hours. CBC: Recent Labs    12/25/17 0830  WBC 9.9  NEUTROABS 4,109  HGB 13.7  HCT 42.4  MCV 85.1  PLT 278   Lipid Panel: Recent Labs    12/25/17 0830  CHOL 167  HDL 80  LDLCALC 74  TRIG 54  CHOLHDL 2.1   Lab Results  Component Value Date   HGBA1C 5.5 08/02/2018    1. Vascular dementia, uncomplicated -stable functionally per [place] Exam 12/25/2017 09/11/2016 08/06/2015  Orientation to time 4 3 5   Orientation to Place 4 4 3   Registration 3 3 3   Attention/ Calculation 5 5 5   Recall 0 0  0  Language- name 2 objects 2 2 2   Language- repeat 1 1 1   Language- follow 3 step command 3 3 3   Language- read & follow direction 1 1 1   Write a sentence 1 0 1  Copy design 0 0 0  Total score 24 22 24   -balance a little worse  2. Essential hypertension - bp at goal with amlodipine, losartan, hydralazine - COMPLETE METABOLIC PANEL WITH GFR; Future  3. Stage 2 chronic kidney disease -kidneys stable from last time, continues on arb - CBC with Differential/Platelet; Future - COMPLETE METABOLIC PANEL WITH GFR; Future  4. Hyperglycemia - improved a bit from last check, cont to work on portions and increase activity with walking at home - Hemoglobin A1c; Future  5. Pure hypercholesterolemia - cont zocor therapy and increase physical activity, low fat, low cholesterol diet - Lipid panel; Future  6. Urge urinary incontinence -stable with ditropan  7.  Need for influenza:  Vaccine given  Labs/tests ordered:   Orders Placed This Encounter  Procedures  . CBC with Differential/Platelet    Standing Status:   Future    Standing Expiration Date:   08/20/2019  . COMPLETE METABOLIC PANEL WITH GFR    Standing Status:   Future    Standing Expiration Date:   08/20/2019  . Hemoglobin A1c    Standing Status:   Future    Standing Expiration Date:   08/20/2019  . Lipid panel    Standing Status:   Future    Standing Expiration Date:   08/20/2019    Next appt:  12/29/2018   Albert Hersch L. Blaise Grieshaber, D.O. [facility] [address], [place] [address] Cell Phone (Mon-Fri 8am-5pm):  [phone] On Call:  [phone] & follow prompts after 5pm & weekends Office Phone:  [phone] Office Fax:  [phone]

## 2018-08-19 NOTE — Patient Instructions (Signed)
Fat and Cholesterol Restricted Diet Getting too much fat and cholesterol in your diet may cause health problems. Following this diet helps keep your fat and cholesterol at normal levels. This can keep you from getting sick. What types of fat should I choose?  Choose monosaturated and polyunsaturated fats. These are found in foods such as olive oil, canola oil, flaxseeds, walnuts, almonds, and seeds.  Eat more omega-3 fats. Good choices include salmon, mackerel, sardines, tuna, flaxseed oil, and ground flaxseeds.  Limit saturated fats. These are in animal products such as meats, butter, and cream. They can also be in plant products such as palm oil, palm kernel oil, and coconut oil.  Avoid foods with partially hydrogenated oils in them. These contain trans fats. Examples of foods that have trans fats are stick margarine, some tub margarines, cookies, crackers, and other baked goods. What general guidelines do I need to follow?  Check food labels. Look for the words "trans fat" and "saturated fat."  When preparing a meal: ? Fill half of your plate with vegetables and green salads. ? Fill one fourth of your plate with whole grains. Look for the word "whole" as the first word in the ingredient list. ? Fill one fourth of your plate with lean protein foods.  Eat more foods that have fiber, like apples, carrots, beans, peas, and barley.  Eat more home-cooked foods. Eat less at restaurants and buffets.  Limit or avoid alcohol.  Limit foods high in starch and sugar.  Limit fried foods.  Cook foods without frying them. Baking, boiling, grilling, and broiling are all great options.  Lose weight if you are overweight. Losing even a small amount of weight can help your overall health. It can also help prevent diseases such as diabetes and heart disease. What foods can I eat? Grains Whole grains, such as whole wheat or whole grain breads, crackers, cereals, and pasta. Unsweetened oatmeal,  bulgur, barley, quinoa, or brown rice. Corn or whole wheat flour tortillas. Vegetables Fresh or frozen vegetables (raw, steamed, roasted, or grilled). Green salads. Fruits All fresh, canned (in natural juice), or frozen fruits. Meat and Other Protein Products Ground beef (85% or leaner), grass-fed beef, or beef trimmed of fat. Skinless chicken or turkey. Ground chicken or turkey. Pork trimmed of fat. All fish and seafood. Eggs. Dried beans, peas, or lentils. Unsalted nuts or seeds. Unsalted canned or dry beans. Dairy Low-fat dairy products, such as skim or 1% milk, 2% or reduced-fat cheeses, low-fat ricotta or cottage cheese, or plain low-fat yogurt. Fats and Oils Tub margarines without trans fats. Light or reduced-fat mayonnaise and salad dressings. Avocado. Olive, canola, sesame, or safflower oils. Natural peanut or almond butter (choose ones without added sugar and oil). The items listed above may not be a complete list of recommended foods or beverages. Contact your dietitian for more options. What foods are not recommended? Grains White bread. White pasta. White rice. Cornbread. Bagels, pastries, and croissants. Crackers that contain trans fat. Vegetables White potatoes. Corn. Creamed or fried vegetables. Vegetables in a cheese sauce. Fruits Dried fruits. Canned fruit in light or heavy syrup. Fruit juice. Meat and Other Protein Products Fatty cuts of meat. Ribs, chicken wings, bacon, sausage, bologna, salami, chitterlings, fatback, hot dogs, bratwurst, and packaged luncheon meats. Liver and organ meats. Dairy Whole or 2% milk, cream, half-and-half, and cream cheese. Whole milk cheeses. Whole-fat or sweetened yogurt. Full-fat cheeses. Nondairy creamers and whipped toppings. Processed cheese, cheese spreads, or cheese curds. Sweets and Desserts Corn   syrup, sugars, honey, and molasses. Candy. Jam and jelly. Syrup. Sweetened cereals. Cookies, pies, cakes, donuts, muffins, and ice  cream. Fats and Oils Butter, stick margarine, lard, shortening, ghee, or bacon fat. Coconut, palm kernel, or palm oils. Beverages Alcohol. Sweetened drinks (such as sodas, lemonade, and fruit drinks or punches). The items listed above may not be a complete list of foods and beverages to avoid. Contact your dietitian for more information. This information is not intended to replace advice given to you by your health care provider. Make sure you discuss any questions you have with your health care provider. Document Released: 05/25/2012 Document Revised: 07/31/2016 Document Reviewed: 02/23/2014 Elsevier Interactive Patient Education  2018 [employer]vier Inc.  

## 2018-09-24 ENCOUNTER — Other Ambulatory Visit: Payer: Self-pay | Admitting: Internal Medicine

## 2018-12-07 ENCOUNTER — Other Ambulatory Visit: Payer: Self-pay | Admitting: Internal Medicine

## 2018-12-29 ENCOUNTER — Ambulatory Visit: Payer: Self-pay

## 2018-12-29 ENCOUNTER — Encounter: Payer: Medicare HMO | Admitting: Family

## 2019-02-09 ENCOUNTER — Other Ambulatory Visit: Payer: Self-pay | Admitting: Internal Medicine

## 2019-02-10 ENCOUNTER — Other Ambulatory Visit: Payer: Medicare HMO

## 2019-02-14 ENCOUNTER — Telehealth: Payer: Self-pay | Admitting: Internal Medicine

## 2019-02-14 NOTE — Telephone Encounter (Signed)
Please call pt's daughter.  Pt missed cbc, cmp, flp and hba1c fasting labs.

## 2019-02-17 ENCOUNTER — Ambulatory Visit: Payer: Medicare HMO | Admitting: Internal Medicine

## 2019-03-11 ENCOUNTER — Other Ambulatory Visit: Payer: Medicare HMO

## 2019-03-21 ENCOUNTER — Ambulatory Visit: Payer: Medicare HMO | Admitting: Internal Medicine

## 2019-04-19 ENCOUNTER — Other Ambulatory Visit: Payer: Self-pay

## 2019-04-19 ENCOUNTER — Ambulatory Visit (INDEPENDENT_AMBULATORY_CARE_PROVIDER_SITE_OTHER): Payer: Medicare HMO | Admitting: Family

## 2019-04-19 ENCOUNTER — Encounter: Payer: Self-pay | Admitting: Family

## 2019-04-19 DIAGNOSIS — Z23 Encounter for immunization: Secondary | ICD-10-CM

## 2019-04-19 DIAGNOSIS — Z0000 Encounter for general adult medical examination without abnormal findings: Secondary | ICD-10-CM | POA: Diagnosis not present

## 2019-04-19 MED ORDER — ZOSTER VAC RECOMB ADJUVANTED 50 MCG/0.5ML IM SUSR: 0.5000 mL | Freq: Once | INTRAMUSCULAR | 1 refills | Status: AC

## 2019-04-19 NOTE — Progress Notes (Signed)
   This service is provided via telemedicine  No vital signs collected/recorded due to the encounter was a telemedicine visit.   Location of patient (ex: home, work):  Home   Patient consents to a telephone visit: Yes  Location of the provider (ex: office, home):  Office   Name of any referring provider:  Dr. Hollace Kinnier  Names of all persons participating in the telemedicine service and their role in the encounter:  Ruthell Rummage CMA, Dinah Ngetich NP, Rudean Hitt and daughter Vikkie Goeden  Time spent on call:  Ruthell Rummage CMA spent 12  Minutes on the phone with patient.

## 2019-04-19 NOTE — Patient Instructions (Signed)
[name] , Thank you for taking time to come for your Medicare Wellness Visit. I appreciate your ongoing commitment to your health goals. Please review the following plan we discussed and let me know if I can assist you in the future.   Screening recommendations/referrals: Colonoscopy: Up to date  Mammogram: Due this year when COVID-19 restrictions are over. Bone Density: Up to date  Recommended yearly ophthalmology/optometry visit for glaucoma screening and checkup Recommended yearly dental visit for hygiene and checkup  Vaccinations: Influenza vaccine: Up to date  Pneumococcal vaccine: Up to date  Tdap vaccine Up to date due in 2022 Shingles vaccine: Order placed this visit   Advanced directives:Information mailed   Conditions/risks identified:Advanced age female > 16 years,Hypertension ,Hx smoking,sedentary lifestyle   Next appointment: 1 year    Preventive Care 39 Years and Older, Female Preventive care refers to lifestyle choices and visits with your health care provider that can promote health and wellness. What does preventive care include?  A yearly physical exam. This is also called an annual well check.  Dental exams once or twice a year.  Routine eye exams. Ask your health care provider how often you should have your eyes checked.  Personal lifestyle choices, including:  Daily care of your teeth and gums.  Regular physical activity.  Eating a healthy diet.  Avoiding tobacco and drug use.  Limiting alcohol use.  Practicing safe sex.  Taking low-dose aspirin every day.  Taking vitamin and mineral supplements as recommended by your health care provider. What happens during an annual well check? The services and screenings done by your health care provider during your annual well check will depend on your age, overall health, lifestyle risk factors, and family history of disease. Counseling  Your health care provider may ask you questions about your:   Alcohol use.  Tobacco use.  Drug use.  Emotional well-being.  Home and relationship well-being.  Sexual activity.  Eating habits.  History of falls.  Memory and ability to understand (cognition).  Work and work Statistician.  Reproductive health. Screening  You may have the following tests or measurements:  Height, weight, and BMI.  Blood pressure.  Lipid and cholesterol levels. These may be checked every 5 years, or more frequently if you are over 88 years old.  Skin check.  Lung cancer screening. You may have this screening every year starting at age 74 if you have a 30-pack-year history of smoking and currently smoke or have quit within the past 15 years.  Fecal occult blood test (FOBT) of the stool. You may have this test every year starting at age 40.  Flexible sigmoidoscopy or colonoscopy. You may have a sigmoidoscopy every 5 years or a colonoscopy every 10 years starting at age 95.  Hepatitis C blood test.  Hepatitis B blood test.  Sexually transmitted disease (STD) testing.  Diabetes screening. This is done by checking your blood sugar (glucose) after you have not eaten for a while (fasting). You may have this done every 1-3 years.  Bone density scan. This is done to screen for osteoporosis. You may have this done starting at age 58.  Mammogram. This may be done every 1-2 years. Talk to your health care provider about how often you should have regular mammograms. Talk with your health care provider about your test results, treatment options, and if necessary, the need for more tests. Vaccines  Your health care provider may recommend certain vaccines, such as:  Influenza vaccine. This is recommended  every year.  Tetanus, diphtheria, and acellular pertussis (Tdap, Td) vaccine. You may need a Td booster every 10 years.  Zoster vaccine. You may need this after age 76.  Pneumococcal 13-valent conjugate (PCV13) vaccine. One dose is recommended after age 41.   Pneumococcal polysaccharide (PPSV23) vaccine. One dose is recommended after age 81. Talk to your health care provider about which screenings and vaccines you need and how often you need them. This information is not intended to replace advice given to you by your health care provider. Make sure you discuss any questions you have with your health care provider. Document Released: 12/21/2015 Document Revised: 08/13/2016 Document Reviewed: 09/25/2015 Elsevier Interactive Patient Education  2017 [place] Prevention in the Home Falls can cause injuries. They can happen to people of all ages. There are many things you can do to make your home safe and to help prevent falls. What can I do on the outside of my home?  Regularly fix the edges of walkways and driveways and fix any cracks.  Remove anything that might make you trip as you walk through a door, such as a raised step or threshold.  Trim any bushes or trees on the path to your home.  Use bright outdoor lighting.  Clear any walking paths of anything that might make someone trip, such as rocks or tools.  Regularly check to see if handrails are loose or broken. Make sure that both sides of any steps have handrails.  Any raised decks and porches should have guardrails on the edges.  Have any leaves, snow, or ice cleared regularly.  Use sand or salt on walking paths during winter.  Clean up any spills in your garage right away. This includes oil or grease spills. What can I do in the bathroom?  Use night lights.  Install grab bars by the toilet and in the tub and shower. Do not use towel bars as grab bars.  Use non-skid mats or decals in the tub or shower.  If you need to sit down in the shower, use a plastic, non-slip stool.  Keep the floor dry. Clean up any water that spills on the floor as soon as it happens.  Remove soap buildup in the tub or shower regularly.  Attach bath mats securely with double-sided  non-slip rug tape.  Do not have throw rugs and other things on the floor that can make you trip. What can I do in the bedroom?  Use night lights.  Make sure that you have a light by your bed that is easy to reach.  Do not use any sheets or blankets that are too big for your bed. They should not hang down onto the floor.  Have a firm chair that has side arms. You can use this for support while you get dressed.  Do not have throw rugs and other things on the floor that can make you trip. What can I do in the kitchen?  Clean up any spills right away.  Avoid walking on wet floors.  Keep items that you use a lot in easy-to-reach places.  If you need to reach something above you, use a strong step stool that has a grab bar.  Keep electrical cords out of the way.  Do not use floor polish or wax that makes floors slippery. If you must use wax, use non-skid floor wax.  Do not have throw rugs and other things on the floor that can make you trip. What  can I do with my stairs?  Do not leave any items on the stairs.  Make sure that there are handrails on both sides of the stairs and use them. Fix handrails that are broken or loose. Make sure that handrails are as long as the stairways.  Check any carpeting to make sure that it is firmly attached to the stairs. Fix any carpet that is loose or worn.  Avoid having throw rugs at the top or bottom of the stairs. If you do have throw rugs, attach them to the floor with carpet tape.  Make sure that you have a light switch at the top of the stairs and the bottom of the stairs. If you do not have them, ask someone to add them for you. What else can I do to help prevent falls?  Wear shoes that:  Do not have high heels.  Have rubber bottoms.  Are comfortable and fit you well.  Are closed at the toe. Do not wear sandals.  If you use a stepladder:  Make sure that it is fully opened. Do not climb a closed stepladder.  Make sure that both  sides of the stepladder are locked into place.  Ask someone to hold it for you, if possible.  Clearly mark and make sure that you can see:  Any grab bars or handrails.  First and last steps.  Where the edge of each step is.  Use tools that help you move around (mobility aids) if they are needed. These include:  Canes.  Walkers.  Scooters.  Crutches.  Turn on the lights when you go into a dark area. Replace any light bulbs as soon as they burn out.  Set up your furniture so you have a clear path. Avoid moving your furniture around.  If any of your floors are uneven, fix them.  If there are any pets around you, be aware of where they are.  Review your medicines with your doctor. Some medicines can make you feel dizzy. This can increase your chance of falling. Ask your doctor what other things that you can do to help prevent falls. This information is not intended to replace advice given to you by your health care provider. Make sure you discuss any questions you have with your health care provider. Document Released: 09/20/2009 Document Revised: 05/01/2016 Document Reviewed: 12/29/2014 Elsevier Interactive Patient Education  2017 [employer].

## 2019-04-19 NOTE — Progress Notes (Signed)
Subjective:   [name] is a 73 y.o. female who presents for Medicare Annual (Subsequent) preventive examination.  Review of Systems:   Cardiac Risk Factors include: advanced age (>22men, >62 women);hypertension;smoking/ tobacco exposure;sedentary lifestyle     Objective:     Vitals: There were no vitals taken for this visit.  There is no height or weight on file to calculate BMI.  Advanced Directives 04/19/2019 03/22/2018 12/25/2017 04/24/2017 04/24/2017 09/11/2016 03/03/2016  Does Patient Have a Medical Advance Directive? No Yes Yes No No Yes Yes  Type of Advance Directive - Healthcare Power of [place]  Does patient want to make changes to medical advance directive? - - No - Patient declined - - - -  Copy of [place] in Chart? - - No - copy requested - - No - copy requested Yes  Would patient like information on creating a medical advance directive? Yes (ED - Information included in AVS) - - No - Patient declined No - Patient declined - -  Pre-existing out of facility DNR order (yellow form or pink MOST form) - - - - - - -    Tobacco Social History   Tobacco Use  Smoking Status Former Smoker  . Years: 30.00  Smokeless Tobacco Never Used  Tobacco Comment   Quit about 7-8 years ago as of 2015      Counseling given: Not Answered Comment: Quit about 7-8 years ago as of 2015    Clinical Intake:  Pre-visit preparation completed: No  Pain : No/denies pain     Nutritional Risks: None Diabetes: No  How often do you need to have someone help you when you read instructions, pamphlets, or other written materials from your doctor or pharmacy?: 3 - Sometimes What is the last grade level you completed in school?: 1 year college   Interpreter Needed?: No  Information entered by ::   FNP-C   Past Medical History:  Diagnosis Date  . Cataract    slow growing   . Chronic kidney  disease   . Hyperlipidemia   . Hypertension   . Insomnia, unspecified   . Memory loss    mild dementia per daughter   . Osteopenia   . Other abnormal blood chemistry   . Other specified cardiac dysrhythmias(427.89)   . Other specified disease of nail   . Stroke [facility])    2006 or 2007 per daughter   . Syncope and collapse   . Unspecified disorder of kidney and ureter   . Unspecified late effects of cerebrovascular disease   . Unspecified transient cerebral ischemia   . Unspecified urinary incontinence   . Unspecified vitamin D deficiency   . Vascular dementia, uncomplicated [facility])    Past Surgical History:  Procedure Laterality Date  . NO PAST SURGERIES     Family History  Problem Relation Age of Onset  . Pneumonia Mother   . Kidney disease Sister   . Hypertension Brother   . Colon cancer Neg Hx   . Colon polyps Neg Hx   . Esophageal cancer Neg Hx   . Rectal cancer Neg Hx   . Stomach cancer Neg Hx    Social History   Socioeconomic History  . Marital status: Single    Spouse name: Not on file  . Number of children: Not on file  . Years of education: Not on file  . Highest education level: Not on  file  Occupational History  . Not on file  Social Needs  . Financial resource strain: Not hard at all  . Food insecurity:    Worry: Never true    Inability: Never true  . Transportation needs:    Medical: No    Non-medical: No  Tobacco Use  . Smoking status: Former Smoker    Years: 30.00  . Smokeless tobacco: Never Used  . Tobacco comment: Quit about 7-8 years ago as of 2015   Substance and Sexual Activity  . Alcohol use: No  . Drug use: No  . Sexual activity: Never  Lifestyle  . Physical activity:    Days per week: 0 days    Minutes per session: 0 min  . Stress: Only a little  Relationships  . Social connections:    Talks on phone: Once a week    Gets together: More than three times a week    Attends religious service: 1 to 4 times per year    Active member  of club or organization: No    Attends meetings of clubs or organizations: Never    Relationship status: Never married  Other Topics Concern  . Not on file  Social History Narrative  . Not on file    Outpatient Encounter Medications as of 04/19/2019  Medication Sig  . amLODipine (NORVASC) 10 MG tablet TAKE 1 TABLET ONCE DAILY FOR BLOOD PRESSURE  . aspirin EC 81 MG tablet Take 81 mg by mouth daily.  . cholecalciferol (VITAMIN D) 1000 units tablet Take 1,000 Units by mouth daily.  . hydrALAZINE (APRESOLINE) 25 MG tablet TAKE 1 TABLET THREE TIMES DAILY WITH MEALS TO CONTROL  BLOOD PRESSURE  . losartan (COZAAR) 50 MG tablet TAKE 1 TABLET ONCE DAILY FOR BLOOD PRESSURE  . Multiple Vitamin (MULTIVITAMIN) tablet Take 1 tablet by mouth daily.  Marland Kitchen NAMZARIC 28-10 MG CP24 TAKE 1 CAPSULE  DAILY TO PRESERVE MEMORY  . oxybutynin (DITROPAN) 5 MG tablet TAKE 1 TABLET ONE TIME DAILY FOR BLADDER CONTROL  . simvastatin (ZOCOR) 40 MG tablet TAKE 1 TABLET (40 MG TOTAL) BY MOUTH DAILY AT 6PM.   Facility-Administered Encounter Medications as of 04/19/2019  Medication  . 0.9 %  sodium chloride infusion    Activities of Daily Living In your present state of health, do you have any difficulty performing the following activities: 04/19/2019  Hearing? N  Vision? N  Difficulty concentrating or making decisions? N  Walking or climbing stairs? N  Dressing or bathing? N  Doing errands, shopping? N  Preparing Food and eating ? N  Using the Toilet? N  In the past six months, have you accidently leaked urine? N  Do you have problems with loss of bowel control? N  Managing your Medications? N  Managing your Finances? Y  Comment daughter assist   Housekeeping or managing your Housekeeping? Y  Comment daughter assist   Some recent data might be hidden    Patient Care Team: [name], DO as PCP - General (Geriatric Medicine) [name], MD as Consulting Physician (Ophthalmology)    Assessment:    This is a routine wellness examination for [name].  Exercise Activities and Dietary recommendations Current Exercise Habits: The patient does not participate in regular exercise at present, Exercise limited by: None identified  Goals    . Exercise 3x per week (30 min per time)     Patient would like to walk for at least 20 minutes a day inside or outside the  house       Fall Risk Fall Risk  04/19/2019 08/19/2018 12/25/2017 04/24/2017 03/03/2016  Falls in the past year? 0 No No No No  Number falls in past yr: 0 - - - -  Injury with Fall? 0 - - - -  Risk Factor Category  - - - - -  Risk for fall due to : - - - - -  Follow up - - - - -   Is the patient's home free of loose throw rugs in walkways, pet beds, electrical cords, etc?   no      Grab bars in the bathroom? no      Handrails on the stairs?   yes      Adequate lighting?   yes   Depression Screen PHQ 2/9 Scores 04/19/2019 08/19/2018 12/25/2017 04/24/2017  PHQ - 2 Score 0 0 0 0     Cognitive Function MMSE - Mini Mental State Exam 12/25/2017 09/11/2016 08/06/2015  Orientation to time 4 3 5   Orientation to Place 4 4 3   Registration 3 3 3   Attention/ Calculation 5 5 5   Recall 0 0 0  Language- name 2 objects 2 2 2   Language- repeat 1 1 1   Language- follow 3 step command 3 3 3   Language- read & follow direction 1 1 1   Write a sentence 1 0 1  Copy design 0 0 0  Total score 24 22 24      6CIT Screen 04/19/2019  What Year? 0 points  What month? 0 points  What time? 0 points  Count back from 20 0 points  Months in reverse 4 points  Repeat phrase 10 points  Total Score 14    Immunization History  Administered Date(s) Administered  . Influenza, High Dose Seasonal PF 12/25/2017, 08/19/2018  . Influenza,inj,Quad PF,6+ Mos 10/17/2013, 12/07/2015, 09/01/2016  . Influenza-Unspecified 08/25/2008, 09/12/2011, 09/27/2012  . Pneumococcal Conjugate-13 08/04/2014  . Pneumococcal-Unspecified 05/07/2011  . Tdap 05/07/2011    Qualifies  for Shingles Vaccine? Ordered this visit   Screening Tests Health Maintenance  Topic Date Due  . Hepatitis C Screening  [birth year]  . INFLUENZA VACCINE  07/09/2019  . MAMMOGRAM  10/06/2019  . Fecal DNA (Cologuard)  11/27/2020  . TETANUS/TDAP  05/06/2021  . DEXA SCAN  Completed  . PNA vac Low Risk Adult  Completed    Cancer Screenings: Lung: Low Dose CT Chest recommended if Age 8-80 years, 30 pack-year currently smoking OR have quit w/in 15years. Patient does not qualify. Breast:  Up to date on Mammogram? No   Up to date of Bone Density/Dexa? Yes Colorectal: up to date   Additional Screenings: Hepatitis C Screening: low risk     Plan:   - Advance Directives information mailed patient to complete and provide copy to [facility]  - Will need mammogram ordered once COVID-19 restrictions are over daughter will notify provider.  - Increase physical activity/exercise as tolerated.   I have personally reviewed and noted the following in the patient's chart:   . Medical and social history . Use of alcohol, tobacco or illicit drugs  . Current medications and supplements . Functional ability and status . Nutritional status . Physical activity . Advanced directives . List of other physicians . Hospitalizations, surgeries, and ER visits in previous 12 months . Vitals . Screenings to include cognitive, depression, and falls . Referrals and appointments  In addition, I have reviewed and discussed with patient certain preventive protocols, quality metrics, and best practice recommendations.  A written personalized care plan for preventive services as well as general preventive health recommendations were provided to patient.    Sandrea Hughs, NP  04/19/2019

## 2019-05-04 ENCOUNTER — Other Ambulatory Visit: Payer: Self-pay | Admitting: Internal Medicine

## 2019-05-31 ENCOUNTER — Other Ambulatory Visit: Payer: Medicare HMO

## 2019-06-15 ENCOUNTER — Other Ambulatory Visit: Payer: Self-pay

## 2019-06-15 ENCOUNTER — Other Ambulatory Visit: Payer: Medicare HMO

## 2019-06-15 DIAGNOSIS — I10 Essential (primary) hypertension: Secondary | ICD-10-CM | POA: Diagnosis not present

## 2019-06-15 DIAGNOSIS — E7800 Pure hypercholesterolemia, unspecified: Secondary | ICD-10-CM | POA: Diagnosis not present

## 2019-06-15 DIAGNOSIS — R739 Hyperglycemia, unspecified: Secondary | ICD-10-CM | POA: Diagnosis not present

## 2019-06-15 DIAGNOSIS — N182 Chronic kidney disease, stage 2 (mild): Secondary | ICD-10-CM | POA: Diagnosis not present

## 2019-06-16 LAB — HEMOGLOBIN A1C
Hgb A1c MFr Bld: 5.7 % of total Hgb — ABNORMAL HIGH (ref <5.7)
Mean Plasma Glucose: 117 (calc)
eAG (mmol/L): 6.5 (calc)

## 2019-06-16 LAB — CBC WITH DIFFERENTIAL/PLATELET
Absolute Monocytes: 901 cells/uL (ref 200–950)
Basophils Absolute: 34 cells/uL (ref 0–200)
Basophils Relative: 0.3 %
Eosinophils Absolute: 125 cells/uL (ref 15–500)
Eosinophils Relative: 1.1 %
HCT: 43.6 % (ref 35.0–45.0)
Hemoglobin: 14.2 g/dL (ref 11.7–15.5)
Lymphs Abs: 5677 cells/uL — ABNORMAL HIGH (ref 850–3900)
MCH: 28.1 pg (ref 27.0–33.0)
MCHC: 32.6 g/dL (ref 32.0–36.0)
MCV: 86.3 fL (ref 80.0–100.0)
MPV: 10.2 fL (ref 7.5–12.5)
Monocytes Relative: 7.9 %
Neutro Abs: 4663 cells/uL (ref 1500–7800)
Neutrophils Relative %: 40.9 %
Platelets: 284 10*3/uL (ref 140–400)
RBC: 5.05 10*6/uL (ref 3.80–5.10)
RDW: 13.1 % (ref 11.0–15.0)
Total Lymphocyte: 49.8 %
WBC: 11.4 10*3/uL — ABNORMAL HIGH (ref 3.8–10.8)

## 2019-06-16 LAB — COMPLETE METABOLIC PANEL WITH GFR
AG Ratio: 1.2 (calc) (ref 1.0–2.5)
ALT: 14 U/L (ref 6–29)
AST: 16 U/L (ref 10–35)
Albumin: 4 g/dL (ref 3.6–5.1)
Alkaline phosphatase (APISO): 85 U/L (ref 37–153)
BUN/Creatinine Ratio: 12 (calc) (ref 6–22)
BUN: 15 mg/dL (ref 7–25)
CO2: 25 mmol/L (ref 20–32)
Calcium: 9.7 mg/dL (ref 8.6–10.4)
Chloride: 105 mmol/L (ref 98–110)
Creat: 1.24 mg/dL — ABNORMAL HIGH (ref 0.60–0.93)
GFR, Est African American: 49 mL/min/{1.73_m2} — ABNORMAL LOW (ref >=60)
GFR, Est Non African American: 42 mL/min/{1.73_m2} — ABNORMAL LOW (ref >=60)
Globulin: 3.4 g/dL (calc) (ref 1.9–3.7)
Glucose, Bld: 111 mg/dL — ABNORMAL HIGH (ref 65–99)
Potassium: 4.1 mmol/L (ref 3.5–5.3)
Sodium: 140 mmol/L (ref 135–146)
Total Bilirubin: 0.6 mg/dL (ref 0.2–1.2)
Total Protein: 7.4 g/dL (ref 6.1–8.1)

## 2019-06-16 LAB — LIPID PANEL
Cholesterol: 180 mg/dL (ref <200)
HDL: 71 mg/dL (ref >=50)
LDL Cholesterol (Calc): 94 mg/dL (calc)
Non-HDL Cholesterol (Calc): 109 mg/dL (calc) (ref <130)
Total CHOL/HDL Ratio: 2.5 (calc) (ref <5.0)
Triglycerides: 66 mg/dL (ref <150)

## 2019-06-27 ENCOUNTER — Encounter: Payer: Self-pay | Admitting: Internal Medicine

## 2019-06-27 ENCOUNTER — Ambulatory Visit (INDEPENDENT_AMBULATORY_CARE_PROVIDER_SITE_OTHER): Payer: Medicare HMO | Admitting: Internal Medicine

## 2019-06-27 ENCOUNTER — Other Ambulatory Visit: Payer: Self-pay

## 2019-06-27 VITALS — BP 120/70 | HR 88 | Temp 98.9°F | Ht 64.0 in | Wt 270.0 lb

## 2019-06-27 DIAGNOSIS — N3941 Urge incontinence: Secondary | ICD-10-CM

## 2019-06-27 DIAGNOSIS — D72829 Elevated white blood cell count, unspecified: Secondary | ICD-10-CM | POA: Diagnosis not present

## 2019-06-27 DIAGNOSIS — R2681 Unsteadiness on feet: Secondary | ICD-10-CM

## 2019-06-27 DIAGNOSIS — R739 Hyperglycemia, unspecified: Secondary | ICD-10-CM

## 2019-06-27 DIAGNOSIS — F0150 Vascular dementia without behavioral disturbance: Secondary | ICD-10-CM | POA: Diagnosis not present

## 2019-06-27 DIAGNOSIS — I10 Essential (primary) hypertension: Secondary | ICD-10-CM

## 2019-06-27 DIAGNOSIS — N182 Chronic kidney disease, stage 2 (mild): Secondary | ICD-10-CM

## 2019-06-27 DIAGNOSIS — Z6841 Body Mass Index (BMI) 40.0 and over, adult: Secondary | ICD-10-CM | POA: Insufficient documentation

## 2019-06-27 LAB — CBC WITH DIFFERENTIAL/PLATELET
Absolute Monocytes: 905 cells/uL (ref 200–950)
Basophils Absolute: 26 cells/uL (ref 0–200)
Basophils Relative: 0.3 %
Eosinophils Absolute: 70 cells/uL (ref 15–500)
Eosinophils Relative: 0.8 %
HCT: 42.4 % (ref 35.0–45.0)
Hemoglobin: 13.7 g/dL (ref 11.7–15.5)
Lymphs Abs: 3089 cells/uL (ref 850–3900)
MCH: 27.6 pg (ref 27.0–33.0)
MCHC: 32.3 g/dL (ref 32.0–36.0)
MCV: 85.3 fL (ref 80.0–100.0)
MPV: 9.6 fL (ref 7.5–12.5)
Monocytes Relative: 10.4 %
Neutro Abs: 4611 cells/uL (ref 1500–7800)
Neutrophils Relative %: 53 %
Platelets: 277 10*3/uL (ref 140–400)
RBC: 4.97 10*6/uL (ref 3.80–5.10)
RDW: 12.8 % (ref 11.0–15.0)
Total Lymphocyte: 35.5 %
WBC: 8.7 10*3/uL (ref 3.8–10.8)

## 2019-06-27 NOTE — Progress Notes (Signed)
Location:  [facility]   Advanced Directives 04/19/2019  Does Patient Have a Medical Advance Directive? No  Type of Advance Directive -  Does patient want to make changes to medical advance directive? -  Copy of [place] in Chart? -  Would patient like information on creating a medical advance directive? Yes (ED - Information included in AVS)  Pre-existing out of facility DNR order (yellow form or pink MOST form) -     Chief Complaint  Patient presents with   Medical Management of Chronic Issues    6MTH FOLLOW-UP    HPI: Patient is a 73 y.o. female seen today for medical management of chronic diseases.    She presents today with her daughter who is her main caregiver. Daughter states she is having gait problems around the house. She is moving slower and at times seems unbalanced when she stands. Daughter is asking for a walker or rollator device. There have been no recent falls or injuries, but daughter claims she has been close to falling within the past few weeks on multiple occassions.   Daughter also states that her mom is sleeping more throughout the day. She has trouble staying awake at times. She is also sleeping throughout the night without waking up often.   The patient claims they are eating at least two meals a day and staying hydrated. She also states she is watching her sweets and salty foods.   Patient is able to have regular bowel movements.    Past Medical History:  Diagnosis Date   Cataract    slow growing    Chronic kidney disease    Hyperlipidemia    Hypertension    Insomnia, unspecified    Memory loss    mild dementia per daughter    Osteopenia    Other abnormal blood chemistry    Other specified cardiac dysrhythmias(427.89)    Other specified disease of nail    Stroke ([place])    2006 or 2007 per daughter    Syncope and collapse    Unspecified disorder of kidney and ureter    Unspecified late effects of  cerebrovascular disease    Unspecified transient cerebral ischemia    Unspecified urinary incontinence    Unspecified vitamin D deficiency    Vascular dementia, uncomplicated (HCC)     Past Surgical History:  Procedure Laterality Date   NO PAST SURGERIES      No Known Allergies  Outpatient Encounter Medications as of 06/27/2019  Medication Sig   amLODipine (NORVASC) 10 MG tablet TAKE 1 TABLET ONCE DAILY FOR BLOOD PRESSURE   aspirin EC 81 MG tablet Take 81 mg by mouth daily.   cholecalciferol (VITAMIN D) 1000 units tablet Take 1,000 Units by mouth daily.   hydrALAZINE (APRESOLINE) 25 MG tablet TAKE 1 TABLET THREE TIMES DAILY WITH MEALS TO CONTROL  BLOOD PRESSURE   losartan (COZAAR) 50 MG tablet TAKE 1 TABLET ONE TIME DAILY FOR BLOOD PRESSURE   Multiple Vitamin (MULTIVITAMIN) tablet Take 1 tablet by mouth daily.   NAMZARIC 28-10 MG CP24 TAKE 1 CAPSULE  DAILY TO PRESERVE MEMORY   oxybutynin (DITROPAN) 5 MG tablet TAKE 1 TABLET ONE TIME DAILY FOR BLADDER CONTROL   simvastatin (ZOCOR) 40 MG tablet TAKE 1 TABLET (40 MG TOTAL) BY MOUTH DAILY AT 6PM.   Facility-Administered Encounter Medications as of 06/27/2019  Medication   0.9 %  sodium chloride infusion    Review of Systems:  Review of Systems  Constitutional:  Positive for activity change and fatigue. Negative for appetite change and fever.  HENT: Negative for dental problem, hearing loss and trouble swallowing.   Respiratory: Negative for cough, shortness of breath and wheezing.   Cardiovascular: Positive for leg swelling. Negative for chest pain and palpitations.  Gastrointestinal: Negative for constipation, diarrhea and nausea.  Endocrine: Negative for polydipsia, polyphagia and polyuria.  Genitourinary: Negative for dyspareunia and hematuria.  Musculoskeletal: Positive for gait problem. Negative for arthralgias, back pain, joint swelling and myalgias.  Skin: Negative.   Neurological: Negative for dizziness,  weakness, light-headedness and numbness.  Psychiatric/Behavioral: Positive for confusion. Negative for agitation and sleep disturbance. The patient is not nervous/anxious.     Health Maintenance  Topic Date Due   Hepatitis C Screening  1946/03/12   INFLUENZA VACCINE  07/09/2019   Fecal DNA (Cologuard)  11/27/2020   TETANUS/TDAP  05/06/2021   DEXA SCAN  Completed   PNA vac Low Risk Adult  Completed    Physical Exam: Vitals:   06/27/19 1531  BP: 120/70  Pulse: 88  Temp: 98.9 F (37.2 C)  TempSrc: Oral  SpO2: 95%  Weight: 270 lb (122.5 kg)  Height: 5\' 4"  (1.626 m)   Body mass index is 46.35 kg/m. Physical Exam Constitutional:      General: She is not in acute distress.    Appearance: Normal appearance. She is not ill-appearing.  HENT:     Head: Normocephalic.     Mouth/Throat:     Mouth: Mucous membranes are dry.  Cardiovascular:     Rate and Rhythm: Normal rate and regular rhythm.     Pulses: Normal pulses.     Heart sounds: Normal heart sounds. No murmur.  Pulmonary:     Effort: Pulmonary effort is normal. No respiratory distress.     Breath sounds: Normal breath sounds. No wheezing.  Abdominal:     General: Bowel sounds are normal. There is no distension.     Palpations: Abdomen is soft.     Tenderness: There is no abdominal tenderness.  Musculoskeletal: Normal range of motion.        General: No swelling, tenderness or signs of injury.     Right lower leg: Edema present.     Left lower leg: Edema present.  Skin:    General: Skin is warm and dry.     Capillary Refill: Capillary refill takes 2 to 3 seconds.  Neurological:     General: No focal deficit present.     Mental Status: She is alert and oriented to person, place, and time.  Psychiatric:        Mood and Affect: Mood normal.        Behavior: Behavior normal.        Thought Content: Thought content normal.        Judgment: Judgment normal.     Labs reviewed: Basic Metabolic Panel: Recent  Labs    08/02/18 0859 06/15/19 0904  NA 142 140  K 4.1 4.1  CL 106 105  CO2 28 25  GLUCOSE 106 111*  BUN 22 15  CREATININE 1.39* 1.24*  CALCIUM 9.5 9.7   Liver Function Tests: Recent Labs    06/15/19 0904  AST 16  ALT 14  BILITOT 0.6  PROT 7.4   No results for input(s): LIPASE, AMYLASE in the last 8760 hours. No results for input(s): AMMONIA in the last 8760 hours. CBC: Recent Labs    06/15/19 0904  WBC 11.4*  NEUTROABS 4,663  HGB  14.2  HCT 43.6  MCV 86.3  PLT 284   Lipid Panel: Recent Labs    06/15/19 0904  CHOL 180  HDL 71  LDLCALC 94  TRIG 66  CHOLHDL 2.5   Lab Results  Component Value Date   HGBA1C 5.7 (H) 06/15/2019    Procedures since last visit: No results found.  Assessment/Plan 1. Unsteady gait - Ambulatory referral to Home Health PT  2. Vascular dementia, uncomplicated ([place]) - Ambulatory referral to [facility] for PT - question progression of dementia  3. Essential hypertension - bp at goal, continue current meds -encourage a diet low in sodium and processed foods  4. Stage 2 chronic kidney disease - unchanged, will continue to monitor - avoid nephrotoxic medications and promote renal dosages when necessary - CBC with differential- future - CMP wit GFR- future - microalbumin- future  5. Hyperglycemia - encouraged to avoid foods high in sugar and carbs -[place] A1C increased slightly from last time - Hgb A1C- future  6. Urge urinary incontinence - improved with oxybutynin - Ambulatory referral to [facility]  7. BMI 40.0-44.9, adult ([place]) - patient less active during the day - encourage weight loss through calorie counting and limiting portion sizes  8. Leukocytosis, unspecified type - no symptoms to explain elevated wbc count - no recent signs of infection -will recheck wbc count  - CBC with Differential/Platelet    Labs/tests ordered:  Home Health Pt Next appt:  Follow up in 6 months

## 2019-06-30 DIAGNOSIS — H401231 Low-tension glaucoma, bilateral, mild stage: Secondary | ICD-10-CM | POA: Diagnosis not present

## 2019-07-08 ENCOUNTER — Telehealth: Payer: Self-pay

## 2019-07-08 NOTE — Telephone Encounter (Signed)
[name] with [facility] called to inform Dr.Reed per patients family request there will be a delay in the start of care.  Start of care 07/25/2019

## 2019-07-25 DIAGNOSIS — R600 Localized edema: Secondary | ICD-10-CM

## 2019-07-25 DIAGNOSIS — N182 Chronic kidney disease, stage 2 (mild): Secondary | ICD-10-CM | POA: Diagnosis not present

## 2019-07-25 DIAGNOSIS — I69318 Other symptoms and signs involving cognitive functions following cerebral infarction: Secondary | ICD-10-CM | POA: Diagnosis not present

## 2019-07-25 DIAGNOSIS — E559 Vitamin D deficiency, unspecified: Secondary | ICD-10-CM

## 2019-07-25 DIAGNOSIS — Z9181 History of falling: Secondary | ICD-10-CM

## 2019-07-25 DIAGNOSIS — F0150 Vascular dementia without behavioral disturbance: Secondary | ICD-10-CM | POA: Diagnosis not present

## 2019-07-25 DIAGNOSIS — I129 Hypertensive chronic kidney disease with stage 1 through stage 4 chronic kidney disease, or unspecified chronic kidney disease: Secondary | ICD-10-CM | POA: Diagnosis not present

## 2019-07-25 DIAGNOSIS — N3941 Urge incontinence: Secondary | ICD-10-CM

## 2019-07-26 DIAGNOSIS — N182 Chronic kidney disease, stage 2 (mild): Secondary | ICD-10-CM | POA: Diagnosis not present

## 2019-07-26 DIAGNOSIS — E559 Vitamin D deficiency, unspecified: Secondary | ICD-10-CM | POA: Diagnosis not present

## 2019-07-26 DIAGNOSIS — F0150 Vascular dementia without behavioral disturbance: Secondary | ICD-10-CM | POA: Diagnosis not present

## 2019-07-26 DIAGNOSIS — I129 Hypertensive chronic kidney disease with stage 1 through stage 4 chronic kidney disease, or unspecified chronic kidney disease: Secondary | ICD-10-CM | POA: Diagnosis not present

## 2019-07-26 DIAGNOSIS — R600 Localized edema: Secondary | ICD-10-CM | POA: Diagnosis not present

## 2019-07-26 DIAGNOSIS — Z9181 History of falling: Secondary | ICD-10-CM | POA: Diagnosis not present

## 2019-07-26 DIAGNOSIS — I69318 Other symptoms and signs involving cognitive functions following cerebral infarction: Secondary | ICD-10-CM | POA: Diagnosis not present

## 2019-07-26 DIAGNOSIS — N3941 Urge incontinence: Secondary | ICD-10-CM | POA: Diagnosis not present

## 2019-08-02 DIAGNOSIS — R600 Localized edema: Secondary | ICD-10-CM | POA: Diagnosis not present

## 2019-08-02 DIAGNOSIS — F0150 Vascular dementia without behavioral disturbance: Secondary | ICD-10-CM | POA: Diagnosis not present

## 2019-08-02 DIAGNOSIS — N182 Chronic kidney disease, stage 2 (mild): Secondary | ICD-10-CM | POA: Diagnosis not present

## 2019-08-02 DIAGNOSIS — I129 Hypertensive chronic kidney disease with stage 1 through stage 4 chronic kidney disease, or unspecified chronic kidney disease: Secondary | ICD-10-CM | POA: Diagnosis not present

## 2019-08-02 DIAGNOSIS — E559 Vitamin D deficiency, unspecified: Secondary | ICD-10-CM | POA: Diagnosis not present

## 2019-08-02 DIAGNOSIS — I69318 Other symptoms and signs involving cognitive functions following cerebral infarction: Secondary | ICD-10-CM | POA: Diagnosis not present

## 2019-08-02 DIAGNOSIS — Z9181 History of falling: Secondary | ICD-10-CM | POA: Diagnosis not present

## 2019-08-02 DIAGNOSIS — N3941 Urge incontinence: Secondary | ICD-10-CM | POA: Diagnosis not present

## 2019-08-04 DIAGNOSIS — E559 Vitamin D deficiency, unspecified: Secondary | ICD-10-CM | POA: Diagnosis not present

## 2019-08-04 DIAGNOSIS — I69318 Other symptoms and signs involving cognitive functions following cerebral infarction: Secondary | ICD-10-CM | POA: Diagnosis not present

## 2019-08-04 DIAGNOSIS — N182 Chronic kidney disease, stage 2 (mild): Secondary | ICD-10-CM | POA: Diagnosis not present

## 2019-08-04 DIAGNOSIS — I129 Hypertensive chronic kidney disease with stage 1 through stage 4 chronic kidney disease, or unspecified chronic kidney disease: Secondary | ICD-10-CM | POA: Diagnosis not present

## 2019-08-04 DIAGNOSIS — Z9181 History of falling: Secondary | ICD-10-CM | POA: Diagnosis not present

## 2019-08-04 DIAGNOSIS — F0150 Vascular dementia without behavioral disturbance: Secondary | ICD-10-CM | POA: Diagnosis not present

## 2019-08-04 DIAGNOSIS — N3941 Urge incontinence: Secondary | ICD-10-CM | POA: Diagnosis not present

## 2019-08-04 DIAGNOSIS — R600 Localized edema: Secondary | ICD-10-CM | POA: Diagnosis not present

## 2019-08-05 ENCOUNTER — Other Ambulatory Visit: Payer: Self-pay | Admitting: Internal Medicine

## 2019-08-09 DIAGNOSIS — F0150 Vascular dementia without behavioral disturbance: Secondary | ICD-10-CM | POA: Diagnosis not present

## 2019-08-09 DIAGNOSIS — R600 Localized edema: Secondary | ICD-10-CM | POA: Diagnosis not present

## 2019-08-09 DIAGNOSIS — N182 Chronic kidney disease, stage 2 (mild): Secondary | ICD-10-CM | POA: Diagnosis not present

## 2019-08-09 DIAGNOSIS — E559 Vitamin D deficiency, unspecified: Secondary | ICD-10-CM | POA: Diagnosis not present

## 2019-08-09 DIAGNOSIS — I69318 Other symptoms and signs involving cognitive functions following cerebral infarction: Secondary | ICD-10-CM | POA: Diagnosis not present

## 2019-08-09 DIAGNOSIS — I129 Hypertensive chronic kidney disease with stage 1 through stage 4 chronic kidney disease, or unspecified chronic kidney disease: Secondary | ICD-10-CM | POA: Diagnosis not present

## 2019-08-09 DIAGNOSIS — N3941 Urge incontinence: Secondary | ICD-10-CM | POA: Diagnosis not present

## 2019-08-09 DIAGNOSIS — Z9181 History of falling: Secondary | ICD-10-CM | POA: Diagnosis not present

## 2019-08-12 DIAGNOSIS — I69318 Other symptoms and signs involving cognitive functions following cerebral infarction: Secondary | ICD-10-CM | POA: Diagnosis not present

## 2019-08-12 DIAGNOSIS — R600 Localized edema: Secondary | ICD-10-CM | POA: Diagnosis not present

## 2019-08-12 DIAGNOSIS — F0150 Vascular dementia without behavioral disturbance: Secondary | ICD-10-CM | POA: Diagnosis not present

## 2019-08-12 DIAGNOSIS — N3941 Urge incontinence: Secondary | ICD-10-CM | POA: Diagnosis not present

## 2019-08-12 DIAGNOSIS — Z9181 History of falling: Secondary | ICD-10-CM | POA: Diagnosis not present

## 2019-08-12 DIAGNOSIS — E559 Vitamin D deficiency, unspecified: Secondary | ICD-10-CM | POA: Diagnosis not present

## 2019-08-12 DIAGNOSIS — I129 Hypertensive chronic kidney disease with stage 1 through stage 4 chronic kidney disease, or unspecified chronic kidney disease: Secondary | ICD-10-CM | POA: Diagnosis not present

## 2019-08-12 DIAGNOSIS — N182 Chronic kidney disease, stage 2 (mild): Secondary | ICD-10-CM | POA: Diagnosis not present

## 2019-08-16 DIAGNOSIS — E559 Vitamin D deficiency, unspecified: Secondary | ICD-10-CM | POA: Diagnosis not present

## 2019-08-16 DIAGNOSIS — I69318 Other symptoms and signs involving cognitive functions following cerebral infarction: Secondary | ICD-10-CM | POA: Diagnosis not present

## 2019-08-16 DIAGNOSIS — F0150 Vascular dementia without behavioral disturbance: Secondary | ICD-10-CM | POA: Diagnosis not present

## 2019-08-16 DIAGNOSIS — N3941 Urge incontinence: Secondary | ICD-10-CM | POA: Diagnosis not present

## 2019-08-16 DIAGNOSIS — I129 Hypertensive chronic kidney disease with stage 1 through stage 4 chronic kidney disease, or unspecified chronic kidney disease: Secondary | ICD-10-CM | POA: Diagnosis not present

## 2019-08-16 DIAGNOSIS — R600 Localized edema: Secondary | ICD-10-CM | POA: Diagnosis not present

## 2019-08-16 DIAGNOSIS — Z9181 History of falling: Secondary | ICD-10-CM | POA: Diagnosis not present

## 2019-08-16 DIAGNOSIS — N182 Chronic kidney disease, stage 2 (mild): Secondary | ICD-10-CM | POA: Diagnosis not present

## 2019-08-18 DIAGNOSIS — N3941 Urge incontinence: Secondary | ICD-10-CM | POA: Diagnosis not present

## 2019-08-18 DIAGNOSIS — Z9181 History of falling: Secondary | ICD-10-CM | POA: Diagnosis not present

## 2019-08-18 DIAGNOSIS — R600 Localized edema: Secondary | ICD-10-CM | POA: Diagnosis not present

## 2019-08-18 DIAGNOSIS — I69318 Other symptoms and signs involving cognitive functions following cerebral infarction: Secondary | ICD-10-CM | POA: Diagnosis not present

## 2019-08-18 DIAGNOSIS — N182 Chronic kidney disease, stage 2 (mild): Secondary | ICD-10-CM | POA: Diagnosis not present

## 2019-08-18 DIAGNOSIS — F0150 Vascular dementia without behavioral disturbance: Secondary | ICD-10-CM | POA: Diagnosis not present

## 2019-08-18 DIAGNOSIS — I129 Hypertensive chronic kidney disease with stage 1 through stage 4 chronic kidney disease, or unspecified chronic kidney disease: Secondary | ICD-10-CM | POA: Diagnosis not present

## 2019-08-18 DIAGNOSIS — E559 Vitamin D deficiency, unspecified: Secondary | ICD-10-CM | POA: Diagnosis not present

## 2019-08-24 DIAGNOSIS — E559 Vitamin D deficiency, unspecified: Secondary | ICD-10-CM | POA: Diagnosis not present

## 2019-08-24 DIAGNOSIS — Z9181 History of falling: Secondary | ICD-10-CM | POA: Diagnosis not present

## 2019-08-24 DIAGNOSIS — N182 Chronic kidney disease, stage 2 (mild): Secondary | ICD-10-CM | POA: Diagnosis not present

## 2019-08-24 DIAGNOSIS — F0150 Vascular dementia without behavioral disturbance: Secondary | ICD-10-CM | POA: Diagnosis not present

## 2019-08-24 DIAGNOSIS — I129 Hypertensive chronic kidney disease with stage 1 through stage 4 chronic kidney disease, or unspecified chronic kidney disease: Secondary | ICD-10-CM | POA: Diagnosis not present

## 2019-08-24 DIAGNOSIS — R600 Localized edema: Secondary | ICD-10-CM | POA: Diagnosis not present

## 2019-08-24 DIAGNOSIS — I69318 Other symptoms and signs involving cognitive functions following cerebral infarction: Secondary | ICD-10-CM | POA: Diagnosis not present

## 2019-08-24 DIAGNOSIS — N3941 Urge incontinence: Secondary | ICD-10-CM | POA: Diagnosis not present

## 2019-09-01 ENCOUNTER — Telehealth: Payer: Self-pay | Admitting: *Deleted

## 2019-09-01 DIAGNOSIS — I69318 Other symptoms and signs involving cognitive functions following cerebral infarction: Secondary | ICD-10-CM | POA: Diagnosis not present

## 2019-09-01 DIAGNOSIS — E559 Vitamin D deficiency, unspecified: Secondary | ICD-10-CM | POA: Diagnosis not present

## 2019-09-01 DIAGNOSIS — N182 Chronic kidney disease, stage 2 (mild): Secondary | ICD-10-CM | POA: Diagnosis not present

## 2019-09-01 DIAGNOSIS — Z9181 History of falling: Secondary | ICD-10-CM | POA: Diagnosis not present

## 2019-09-01 DIAGNOSIS — N3941 Urge incontinence: Secondary | ICD-10-CM | POA: Diagnosis not present

## 2019-09-01 DIAGNOSIS — F0150 Vascular dementia without behavioral disturbance: Secondary | ICD-10-CM | POA: Diagnosis not present

## 2019-09-01 DIAGNOSIS — I129 Hypertensive chronic kidney disease with stage 1 through stage 4 chronic kidney disease, or unspecified chronic kidney disease: Secondary | ICD-10-CM | POA: Diagnosis not present

## 2019-09-01 DIAGNOSIS — R600 Localized edema: Secondary | ICD-10-CM | POA: Diagnosis not present

## 2019-09-01 NOTE — Telephone Encounter (Signed)
Charisse with [facility] called requesting verbal orders for PT 1x2wks. Verbal orders given.

## 2019-09-22 DIAGNOSIS — R600 Localized edema: Secondary | ICD-10-CM | POA: Diagnosis not present

## 2019-09-22 DIAGNOSIS — N3941 Urge incontinence: Secondary | ICD-10-CM | POA: Diagnosis not present

## 2019-09-22 DIAGNOSIS — Z9181 History of falling: Secondary | ICD-10-CM | POA: Diagnosis not present

## 2019-09-22 DIAGNOSIS — I69318 Other symptoms and signs involving cognitive functions following cerebral infarction: Secondary | ICD-10-CM | POA: Diagnosis not present

## 2019-09-22 DIAGNOSIS — F0150 Vascular dementia without behavioral disturbance: Secondary | ICD-10-CM | POA: Diagnosis not present

## 2019-09-22 DIAGNOSIS — N182 Chronic kidney disease, stage 2 (mild): Secondary | ICD-10-CM | POA: Diagnosis not present

## 2019-09-22 DIAGNOSIS — I129 Hypertensive chronic kidney disease with stage 1 through stage 4 chronic kidney disease, or unspecified chronic kidney disease: Secondary | ICD-10-CM | POA: Diagnosis not present

## 2019-09-22 DIAGNOSIS — E559 Vitamin D deficiency, unspecified: Secondary | ICD-10-CM | POA: Diagnosis not present

## 2019-11-03 ENCOUNTER — Other Ambulatory Visit: Payer: Self-pay | Admitting: Internal Medicine

## 2019-12-26 ENCOUNTER — Other Ambulatory Visit: Payer: Self-pay

## 2019-12-27 ENCOUNTER — Other Ambulatory Visit: Payer: Medicare HMO

## 2019-12-29 ENCOUNTER — Other Ambulatory Visit: Payer: Self-pay

## 2019-12-29 ENCOUNTER — Ambulatory Visit: Payer: Medicare HMO | Admitting: Internal Medicine

## 2019-12-29 ENCOUNTER — Ambulatory Visit (INDEPENDENT_AMBULATORY_CARE_PROVIDER_SITE_OTHER): Payer: Medicare HMO | Admitting: Internal Medicine

## 2019-12-29 ENCOUNTER — Encounter: Payer: Self-pay | Admitting: Internal Medicine

## 2019-12-29 VITALS — BP 138/82 | HR 81 | Temp 97.3°F | Ht 65.0 in | Wt 280.0 lb

## 2019-12-29 DIAGNOSIS — N182 Chronic kidney disease, stage 2 (mild): Secondary | ICD-10-CM

## 2019-12-29 DIAGNOSIS — F0150 Vascular dementia without behavioral disturbance: Secondary | ICD-10-CM | POA: Diagnosis not present

## 2019-12-29 DIAGNOSIS — Z6841 Body Mass Index (BMI) 40.0 and over, adult: Secondary | ICD-10-CM

## 2019-12-29 DIAGNOSIS — R739 Hyperglycemia, unspecified: Secondary | ICD-10-CM

## 2019-12-29 DIAGNOSIS — I10 Essential (primary) hypertension: Secondary | ICD-10-CM

## 2019-12-29 DIAGNOSIS — R2681 Unsteadiness on feet: Secondary | ICD-10-CM | POA: Diagnosis not present

## 2019-12-29 DIAGNOSIS — E7800 Pure hypercholesterolemia, unspecified: Secondary | ICD-10-CM

## 2019-12-29 NOTE — Patient Instructions (Addendum)
Return for flu shot 2 weeks after covid vaccine #2  Schedule annual physical exam in July with fasting labs same day.  [email or web address]

## 2019-12-29 NOTE — Progress Notes (Signed)
Location:  [facility]  Provider: Dr. Hollace Kinnier   Goals of Care:  Advanced Directives 12/29/2019  Does Patient Have a Medical Advance Directive? Yes  Type of Advance Directive [place]  Does patient want to make changes to medical advance directive? No - Patient declined  Copy of [place] in Chart? -  Would patient like information on creating a medical advance directive? -  Pre-existing out of facility DNR order (yellow form or pink MOST form) -     Chief Complaint  Patient presents with  . Medication Management    6 month follow up med management came for fasting lab.  [name] her daughter is with her  she has questions about patient memory    HPI: Patient is a 74 y.o. female seen today for medical management of chronic diseases.    Daughter present for visit.   She is fasting for labs.   Balance and gait issues have improved. She completed PT/OT a few months ago. Does not ambulate with any assistive devices. Daughter has purchased a exercise bike for her due to recent weight gain.   Claims she is eating 3 meals a day with a snack. Her snack is often a sweet or [employer] bar. Daughter states she has trouble getting her to drink. She does not like water. She will drink dilute juices often instead of water.   Had one fall about 2 months ago. No injury occurred.   Goes to bed late. Sleeps midnight to noon. Does not nap.  Daughter claims her short term memory is getting worse. She will eat dinner and then 20 minutes later will ask for dinner. When she is reorientated, she will not have behavioral outbursts. Denies any hallucinations.   She has had the first part of the covid vaccine yesterday. She is scheduled for her second vaccine Febryary 9th. Iinterested in having her flu vaccine but does not know if she qualifies due to her recent covid vaccination.      Past Medical History:  Diagnosis Date  . Cataract    slow growing   .  Chronic kidney disease   . Hyperlipidemia   . Hypertension   . Insomnia, unspecified   . Memory loss    mild dementia per daughter   . Osteopenia   . Other abnormal blood chemistry   . Other specified cardiac dysrhythmias(427.89)   . Other specified disease of nail   . Stroke [facility])    2006 or 2007 per daughter   . Syncope and collapse   . Unspecified disorder of kidney and ureter   . Unspecified late effects of cerebrovascular disease   . Unspecified transient cerebral ischemia   . Unspecified urinary incontinence   . Unspecified vitamin D deficiency   . Vascular dementia, uncomplicated [facility])     Past Surgical History:  Procedure Laterality Date  . NO PAST SURGERIES      No Known Allergies  Outpatient Encounter Medications as of 12/29/2019  Medication Sig  . amLODipine (NORVASC) 10 MG tablet TAKE 1 TABLET ONCE DAILY FOR BLOOD PRESSURE  . aspirin EC 81 MG tablet Take 81 mg by mouth daily.  . cholecalciferol (VITAMIN D) 1000 units tablet Take 1,000 Units by mouth daily.  . hydrALAZINE (APRESOLINE) 25 MG tablet TAKE 1 TABLET THREE TIMES DAILY WITH MEALS TO CONTROL  BLOOD PRESSURE  . losartan (COZAAR) 50 MG tablet TAKE 1 TABLET ONE TIME DAILY FOR BLOOD PRESSURE  . Multiple Vitamin (  MULTIVITAMIN) tablet Take 1 tablet by mouth daily.  Marland Kitchen NAMZARIC 28-10 MG CP24 TAKE 1 CAPSULE  DAILY TO PRESERVE MEMORY  . oxybutynin (DITROPAN) 5 MG tablet TAKE 1 TABLET ONE TIME DAILY FOR BLADDER CONTROL  . simvastatin (ZOCOR) 40 MG tablet TAKE 1 TABLET EVERY DAY AT 6PM   Facility-Administered Encounter Medications as of 12/29/2019  Medication  . 0.9 %  sodium chloride infusion    Review of Systems:  Review of Systems  Constitutional: Negative for activity change, appetite change and fatigue.  HENT: Negative for dental problem, hearing loss and trouble swallowing.   Eyes: Negative for photophobia and visual disturbance.  Respiratory: Negative for cough, shortness of breath and wheezing.    Cardiovascular: Negative for chest pain and palpitations.  Gastrointestinal: Negative for abdominal pain, constipation, diarrhea and nausea.  Endocrine: Negative for polydipsia, polyphagia and polyuria.  Genitourinary: Negative for dysuria, frequency, hematuria and vaginal bleeding.  Musculoskeletal: Negative for arthralgias and myalgias.  Skin: Negative.   Neurological: Negative for dizziness, weakness, light-headedness and headaches.  Psychiatric/Behavioral: Negative for dysphoric mood and sleep disturbance. The patient is not nervous/anxious.   All other systems reviewed and are negative.   Health Maintenance  Topic Date Due  . INFLUENZA VACCINE  07/09/2019  . Hepatitis C Screening  12/28/2020 (Originally October 19, 1946)  . Fecal DNA (Cologuard)  11/27/2020  . TETANUS/TDAP  05/06/2021  . DEXA SCAN  Completed  . PNA vac Low Risk Adult  Completed    Physical Exam: Vitals:   12/29/19 0845  BP: 138/82  Pulse: 81  Temp: (!) 97.3 F (36.3 C)  TempSrc: Temporal  SpO2: 97%  Weight: 280 lb (127 kg)  Height: 5\' 5"  (1.651 m)   Body mass index is 46.59 kg/m. Physical Exam Vitals reviewed.  Constitutional:      General: She is not in acute distress.    Appearance: Normal appearance. She is normal weight.  Cardiovascular:     Rate and Rhythm: Normal rate and regular rhythm.     Pulses: Normal pulses.     Heart sounds: Normal heart sounds. No murmur.  Pulmonary:     Effort: Pulmonary effort is normal. No respiratory distress.     Breath sounds: Normal breath sounds. No wheezing.  Abdominal:     General: Bowel sounds are normal.     Palpations: Abdomen is soft.  Musculoskeletal:     Right lower leg: Edema present.     Left lower leg: Edema present.  Skin:    General: Skin is warm and dry.     Capillary Refill: Capillary refill takes less than 2 seconds.  Neurological:     General: No focal deficit present.     Mental Status: She is alert and oriented to person, place, and  time. Mental status is at baseline.  Psychiatric:        Attention and Perception: Attention normal.        Mood and Affect: Mood normal.        Behavior: Behavior normal.        Thought Content: Thought content normal.        Cognition and Memory: Memory is impaired.        Judgment: Judgment normal.     Labs reviewed: Basic Metabolic Panel: Recent Labs    06/15/19 0904  NA 140  K 4.1  CL 105  CO2 25  GLUCOSE 111*  BUN 15  CREATININE 1.24*  CALCIUM 9.7   Liver Function Tests: Recent Labs  06/15/19 0904  AST 16  ALT 14  BILITOT 0.6  PROT 7.4   No results for input(s): LIPASE, AMYLASE in the last 8760 hours. No results for input(s): AMMONIA in the last 8760 hours. CBC: Recent Labs    06/15/19 0904 06/27/19 1608  WBC 11.4* 8.7  NEUTROABS 4,663 4,611  HGB 14.2 13.7  HCT 43.6 42.4  MCV 86.3 85.3  PLT 284 277   Lipid Panel: Recent Labs    06/15/19 0904  CHOL 180  HDL 71  LDLCALC 94  TRIG 66  CHOLHDL 2.5   Lab Results  Component Value Date   HGBA1C 5.7 (H) 06/15/2019    Procedures since last visit: No results found.  Assessment/Plan 1. Vascular dementia, uncomplicated ([place]) - she seems to be doing ok, gait and balance issues have improved with PT/OT, no behavioral outbursts - sleep patterns have changed and she is sleeping midnight to noon, hours of sleep have not changed  2. Essential hypertension - bp at goal <150/90 - continue current medication regimen - recommend low sodium diet -complete blood count with differential/platelets-today - complete metabolic panel with GFR- today  3. Stage 2 chronic kidney disease - avoid nephrotoxic medications like NSAIDS and dose adjust medications to be renally excreted, encourage hydration -complete metabolic panel with GFR- today  4. Hyperglycemia - she has gained ten pounds since last visit - her diet includes foods and drinks high in sugar - hemoglobin A1C- today  5. Unsteady gait - she has  had one fall since last visit, home health PT/OT have helped with balance and gait - recommend using cane or walker is she gets unstable  6. Pure hypercholesterolemia - will check lipid panel today due to weight gain and poor dietary choices - continue current statin regimen  7. Morbid obesity ([place]) - see above - encourage light exercise a few times a week, please try exercise bike to see if it increases her activity - TSH- today  8. BMI 45.0-49.9, adult ([place]) - see above     Labs/tests ordered:  Cbc with differential/platelets, complete metabolic panel with GFR, hemoglobin A1C, lipid panel, TSH- today Next appt:  04/23/2020

## 2019-12-30 LAB — COMPLETE METABOLIC PANEL WITH GFR
AG Ratio: 1.3 (calc) (ref 1.0–2.5)
ALT: 13 U/L (ref 6–29)
AST: 16 U/L (ref 10–35)
Albumin: 4 g/dL (ref 3.6–5.1)
Alkaline phosphatase (APISO): 86 U/L (ref 37–153)
BUN/Creatinine Ratio: 14 (calc) (ref 6–22)
BUN: 18 mg/dL (ref 7–25)
CO2: 24 mmol/L (ref 20–32)
Calcium: 9.8 mg/dL (ref 8.6–10.4)
Chloride: 106 mmol/L (ref 98–110)
Creat: 1.3 mg/dL — ABNORMAL HIGH (ref 0.60–0.93)
GFR, Est African American: 46 mL/min/{1.73_m2} — ABNORMAL LOW (ref >=60)
GFR, Est Non African American: 40 mL/min/{1.73_m2} — ABNORMAL LOW (ref >=60)
Globulin: 3.1 g/dL (calc) (ref 1.9–3.7)
Glucose, Bld: 118 mg/dL — ABNORMAL HIGH (ref 65–99)
Potassium: 4.4 mmol/L (ref 3.5–5.3)
Sodium: 142 mmol/L (ref 135–146)
Total Bilirubin: 0.5 mg/dL (ref 0.2–1.2)
Total Protein: 7.1 g/dL (ref 6.1–8.1)

## 2019-12-30 LAB — CBC WITH DIFFERENTIAL/PLATELET
Absolute Monocytes: 705 {cells}/uL (ref 200–950)
Basophils Absolute: 33 {cells}/uL (ref 0–200)
Basophils Relative: 0.4 %
Eosinophils Absolute: 98 {cells}/uL (ref 15–500)
Eosinophils Relative: 1.2 %
HCT: 42.4 % (ref 35.0–45.0)
Hemoglobin: 14 g/dL (ref 11.7–15.5)
Lymphs Abs: 2878 {cells}/uL (ref 850–3900)
MCH: 28.2 pg (ref 27.0–33.0)
MCHC: 33 g/dL (ref 32.0–36.0)
MCV: 85.5 fL (ref 80.0–100.0)
MPV: 10.2 fL (ref 7.5–12.5)
Monocytes Relative: 8.6 %
Neutro Abs: 4485 {cells}/uL (ref 1500–7800)
Neutrophils Relative %: 54.7 %
Platelets: 280 Thousand/uL (ref 140–400)
RBC: 4.96 Million/uL (ref 3.80–5.10)
RDW: 12 % (ref 11.0–15.0)
Total Lymphocyte: 35.1 %
WBC: 8.2 Thousand/uL (ref 3.8–10.8)

## 2019-12-30 LAB — HEMOGLOBIN A1C
Hgb A1c MFr Bld: 5.7 % of total Hgb — ABNORMAL HIGH (ref <5.7)
Mean Plasma Glucose: 117 (calc)
eAG (mmol/L): 6.5 (calc)

## 2019-12-30 LAB — TSH: TSH: 1.51 m[IU]/L (ref 0.40–4.50)

## 2019-12-30 LAB — LIPID PANEL
Cholesterol: 162 mg/dL (ref <200)
HDL: 73 mg/dL (ref >=50)
LDL Cholesterol (Calc): 75 mg/dL (calc)
Non-HDL Cholesterol (Calc): 89 mg/dL (calc) (ref <130)
Total CHOL/HDL Ratio: 2.2 (calc) (ref <5.0)
Triglycerides: 56 mg/dL (ref <150)

## 2019-12-30 NOTE — Progress Notes (Signed)
Please notify her daughter and suggest she get mychart:  Fortunately, [name]'s sugar is stable--has not gone up more--despite her weight gain.  She's still in the prediabetic range.  Cholesterol remains at goal.  Kidneys have not changed since last time.  Continue to encourage hydration with water--consider mio for flavoring rather than juice that has a lot of sugar.  Also, encourage her to gradually start using the exercise bike beginning with 5 mins twice a day and increase every few days by 5 more mins.

## 2020-01-21 ENCOUNTER — Other Ambulatory Visit: Payer: Self-pay | Admitting: Internal Medicine

## 2020-01-23 NOTE — Telephone Encounter (Signed)
rx sent to pharmacy by e-script  

## 2020-02-03 ENCOUNTER — Ambulatory Visit: Payer: Medicare HMO

## 2020-02-16 ENCOUNTER — Encounter: Payer: Self-pay | Admitting: Internal Medicine

## 2020-02-16 DIAGNOSIS — H353131 Nonexudative age-related macular degeneration, bilateral, early dry stage: Secondary | ICD-10-CM | POA: Diagnosis not present

## 2020-02-16 DIAGNOSIS — H401231 Low-tension glaucoma, bilateral, mild stage: Secondary | ICD-10-CM | POA: Diagnosis not present

## 2020-02-16 DIAGNOSIS — H2513 Age-related nuclear cataract, bilateral: Secondary | ICD-10-CM | POA: Diagnosis not present

## 2020-02-16 DIAGNOSIS — H25013 Cortical age-related cataract, bilateral: Secondary | ICD-10-CM | POA: Diagnosis not present

## 2020-02-16 LAB — HM DIABETES EYE EXAM

## 2020-04-19 ENCOUNTER — Other Ambulatory Visit: Payer: Self-pay | Admitting: Internal Medicine

## 2020-04-23 ENCOUNTER — Encounter: Payer: Self-pay | Admitting: Family

## 2020-04-23 ENCOUNTER — Other Ambulatory Visit: Payer: Self-pay

## 2020-04-23 ENCOUNTER — Ambulatory Visit (INDEPENDENT_AMBULATORY_CARE_PROVIDER_SITE_OTHER): Payer: Medicare HMO | Admitting: Family

## 2020-04-23 DIAGNOSIS — Z0000 Encounter for general adult medical examination without abnormal findings: Secondary | ICD-10-CM

## 2020-04-23 NOTE — Progress Notes (Signed)
Subjective:   [name] is a 74 y.o. female who presents for Medicare Annual (Subsequent) preventive examination.  Review of Systems:  Cardiac Risk Factors include: hypertension;dyslipidemia;advanced age (>43men, >8 women);obesity (BMI >30kg/m2);smoking/ tobacco exposure     Objective:     Vitals: There were no vitals taken for this visit.  There is no height or weight on file to calculate BMI.  Advanced Directives 04/23/2020 12/29/2019 04/19/2019 03/22/2018 12/25/2017 04/24/2017 04/24/2017  Does Patient Have a Medical Advance Directive? Yes Yes No Yes Yes No No  Type of Advance Directive Healthcare Power of [place] - -  Does patient want to make changes to medical advance directive? No - Patient declined No - Patient declined - - No - Patient declined - -  Copy of [place] in Chart? No - copy requested - - - No - copy requested - -  Would patient like information on creating a medical advance directive? - - Yes (ED - Information included in AVS) - - No - Patient declined No - Patient declined  Pre-existing out of facility DNR order (yellow form or pink MOST form) - - - - - - -    Tobacco Social History   Tobacco Use  Smoking Status Former Smoker  . Years: 30.00  Smokeless Tobacco Never Used  Tobacco Comment   Quit about 7-8 years ago as of 2015      Counseling given: Not Answered Comment: Quit about 7-8 years ago as of 2015    Clinical Intake:  Pre-visit preparation completed: No  Pain : No/denies pain  BMI - recorded: 46.59 Nutritional Status: BMI > 30  Obese Nutritional Risks: None Diabetes: No  How often do you need to have someone help you when you read instructions, pamphlets, or other written materials from your doctor or pharmacy?: 1 - Never What is the last grade level you completed in school?: 12 Grade and one year of college  Interpreter Needed?:  No  Information entered by :: Waldron Yamen Castrogiovanni FNP-C  Past Medical History:  Diagnosis Date  . Cataract    slow growing   . Chronic kidney disease   . Hyperlipidemia   . Hypertension   . Insomnia, unspecified   . Memory loss    mild dementia per daughter   . Osteopenia   . Other abnormal blood chemistry   . Other specified cardiac dysrhythmias(427.89)   . Other specified disease of nail   . Stroke [facility])    2006 or 2007 per daughter   . Syncope and collapse   . Unspecified disorder of kidney and ureter   . Unspecified late effects of cerebrovascular disease   . Unspecified transient cerebral ischemia   . Unspecified urinary incontinence   . Unspecified vitamin D deficiency   . Vascular dementia, uncomplicated [facility])    Past Surgical History:  Procedure Laterality Date  . NO PAST SURGERIES     Family History  Problem Relation Age of Onset  . Pneumonia Mother   . Kidney disease Sister   . Hypertension Brother   . Colon cancer Neg Hx   . Colon polyps Neg Hx   . Esophageal cancer Neg Hx   . Rectal cancer Neg Hx   . Stomach cancer Neg Hx    Social History   Socioeconomic History  . Marital status: Single    Spouse name: Not on file  . Number of  children: Not on file  . Years of education: Not on file  . Highest education level: Not on file  Occupational History  . Not on file  Tobacco Use  . Smoking status: Former Smoker    Years: 30.00  . Smokeless tobacco: Never Used  . Tobacco comment: Quit about 7-8 years ago as of 2015   Substance and Sexual Activity  . Alcohol use: No  . Drug use: No  . Sexual activity: Never  Other Topics Concern  . Not on file  Social History Narrative  . Not on file   Social Determinants of Health   Financial Resource Strain:   . Difficulty of Paying Living Expenses:   Food Insecurity:   . Worried About Charity fundraiser in the Last Year:   . Arboriculturist in the Last Year:   Transportation Needs:   . Lexicographer (Medical):   Marland Kitchen Lack of Transportation (Non-Medical):   Physical Activity:   . Days of Exercise per Week:   . Minutes of Exercise per Session:   Stress:   . Feeling of Stress :   Social Connections:   . Frequency of Communication with Friends and Family:   . Frequency of Social Gatherings with Friends and Family:   . Attends Religious Services:   . Active Member of Clubs or Organizations:   . Attends Archivist Meetings:   Marland Kitchen Marital Status:     Outpatient Encounter Medications as of 04/23/2020  Medication Sig  . amLODipine (NORVASC) 10 MG tablet TAKE 1 TABLET DAILY FOR BLOOD PRESSURE  . aspirin EC 81 MG tablet Take 81 mg by mouth daily.  . cholecalciferol (VITAMIN D) 1000 units tablet Take 1,000 Units by mouth daily.  . hydrALAZINE (APRESOLINE) 25 MG tablet TAKE 1 TABLET THREE TIMES DAILY WITH MEALS TO CONTROL  BLOOD PRESSURE  . losartan (COZAAR) 50 MG tablet TAKE 1 TABLET ONE TIME DAILY FOR BLOOD PRESSURE  . Multiple Vitamin (MULTIVITAMIN) tablet Take 1 tablet by mouth daily.  Marland Kitchen NAMZARIC 28-10 MG CP24 TAKE 1 CAPSULE  DAILY TO PRESERVE MEMORY  . oxybutynin (DITROPAN) 5 MG tablet TAKE 1 TABLET ONE TIME DAILY FOR BLADDER CONTROL  . simvastatin (ZOCOR) 40 MG tablet TAKE 1 TABLET EVERY DAY AT 6PM   No facility-administered encounter medications on file as of 04/23/2020.    Activities of Daily Living In your present state of health, do you have any difficulty performing the following activities: 04/23/2020  Hearing? N  Vision? N  Difficulty concentrating or making decisions? N  Walking or climbing stairs? N  Dressing or bathing? N  Doing errands, shopping? Y  Comment daughter [place] and eating ? Y  Comment daughter assist  Using the Toilet? N  In the past six months, have you accidently leaked urine? Y  Comment wear depends  Do you have problems with loss of bowel control? N  Managing your Medications? N  Managing your Finances? Y    Comment daughter assist  Housekeeping or managing your Housekeeping? Y  Comment daughter assist  Some recent data might be hidden    Patient Care Team: [name], DO as PCP - General (Geriatric Medicine) [name], MD as Consulting Physician (Ophthalmology)    Assessment:   This is a routine wellness examination for [place].  Exercise Activities and Dietary recommendations Current Exercise Habits: Home exercise routine, Type of exercise: walking, Time (Minutes): 10, Frequency (Times/Week): 2, Weekly Exercise (Minutes/Week):  20, Intensity: Mild, Exercise limited by: None identified  Goals    . Exercise 3x per week (30 min per time)     Patient would like to walk for at least 20 minutes a day inside or outside the house       Fall Risk Fall Risk  04/23/2020 12/29/2019 06/27/2019 04/19/2019 08/19/2018  Falls in the past year? 0 0 0 0 No  Number falls in past yr: 0 0 0 0 -  Injury with Fall? 0 1 0 0 -  Risk Factor Category  - - - - -  Risk for fall due to : - - - - -  Follow up - - - - -   Is the patient's home free of loose throw rugs in walkways, pet beds, electrical cords, etc?   no      Grab bars in the bathroom? yes      Handrails on the stairs?   no      Adequate lighting?   yes  Depression Screen PHQ 2/9 Scores 04/23/2020 12/29/2019 06/27/2019 04/19/2019  PHQ - 2 Score 0 0 0 0     Cognitive Function MMSE - Mini Mental State Exam 12/25/2017 09/11/2016 08/06/2015  Orientation to time 4 3 5   Orientation to Place 4 4 3   Registration 3 3 3   Attention/ Calculation 5 5 5   Recall 0 0 0  Language- name 2 objects 2 2 2   Language- repeat 1 1 1   Language- follow 3 step command 3 3 3   Language- read & follow direction 1 1 1   Write a sentence 1 0 1  Copy design 0 0 0  Total score 24 22 24      6CIT Screen 04/23/2020 04/19/2019  What Year? 4 points 0 points  What month? 0 points 0 points  What time? 0 points 0 points  Count back from 20 4 points 0 points  Months in  reverse 4 points 4 points  Repeat phrase 10 points 10 points  Total Score 22 14    Immunization History  Administered Date(s) Administered  . Influenza, High Dose Seasonal PF 12/25/2017, 08/19/2018  . Influenza,inj,Quad PF,6+ Mos 10/17/2013, 12/07/2015, 09/01/2016  . Influenza-Unspecified 08/25/2008, 09/12/2011, 09/27/2012  . PFIZER SARS-COV-2 Vaccination 12/27/2019, 01/17/2020  . Pneumococcal Conjugate-13 08/04/2014  . Pneumococcal-Unspecified 05/07/2011  . Tdap 05/07/2011    Qualifies for Shingles Vaccine? Has never had shingles vaccine   Screening Tests Health Maintenance  Topic Date Due  . Hepatitis C Screening  12/28/2020 (Originally 1946/11/29)  . INFLUENZA VACCINE  07/08/2020  . TETANUS/TDAP  05/06/2021  . DEXA SCAN  Completed  . COVID-19 Vaccine  Completed  . PNA vac Low Risk Adult  Completed    Cancer Screenings: Lung: Low Dose CT Chest recommended if Age 65-80 years, 30 pack-year currently smoking OR have quit w/in 15years. Patient does not qualify. Breast:  Up to date on Mammogram? No   Up to date of Bone Density/Dexa? Yes Colorectal:Aged Out   Additional Screenings:  Hepatitis C Screening: Completed.     Plan:   - Advised to get Shingrix vaccine at  her Pharmacy.   I have personally reviewed and noted the following in the patient's chart:   . Medical and social history . Use of alcohol, tobacco or illicit drugs  . Current medications and supplements . Functional ability and status . Nutritional status . Physical activity . Advanced directives . List of other physicians . Hospitalizations, surgeries, and ER visits in previous 12 months .  Vitals . Screenings to include cognitive, depression, and falls . Referrals and appointments  In addition, I have reviewed and discussed with patient certain preventive protocols, quality metrics, and best practice recommendations. A written personalized care plan for preventive services as well as general preventive  health recommendations were provided to patient.  Sandrea Hughs, NP  04/23/2020

## 2020-04-23 NOTE — Progress Notes (Signed)
    This service is provided via telemedicine  No vital signs collected/recorded due to the encounter was a telemedicine visit.   Location of patient (ex: home, work):  Home.  Patient consents to a telephone visit: Yes.  Location of the provider (ex: office, home):  [facility].  Name of any referring provider: N/A  Names of all persons participating in the telemedicine service and their role in the encounter: Patient, Daughter [name], [name], [place], Marlowe Sax, NP.    Time spent on call: 8 minutes spent on the phone with Medical Assistant.

## 2020-04-23 NOTE — Patient Instructions (Signed)
[name] , Thank you for taking time to come for your Medicare Wellness Visit. I appreciate your ongoing commitment to your health goals. Please review the following plan we discussed and let me know if I can assist you in the future.   Screening recommendations/referrals: Colonoscopy: Aged out  Mammogram: Up to date  Bone Density : Up to date  Recommended yearly ophthalmology/optometry visit for glaucoma screening and checkup Recommended yearly dental visit for hygiene and checkup  Vaccinations: Influenza vaccine : Up to date  Pneumococcal vaccine : Up to date  Tdap vaccine : Up to date due 05/06/2021  Shingles vaccine: Please get shingrix  vaccine at your pharmacy   Advanced directives: Yes   Conditions/risks identified: Advance age female > 76 yrs,Hypertension,Dyslipidemia,BMI > 30 and Hx of smoking   Next appointment: 1 year    Preventive Care 76 Years and Older, Female Preventive care refers to lifestyle choices and visits with your health care provider that can promote health and wellness. What does preventive care include?  A yearly physical exam. This is also called an annual well check.  Dental exams once or twice a year.  Routine eye exams. Ask your health care provider how often you should have your eyes checked.  Personal lifestyle choices, including:  Daily care of your teeth and gums.  Regular physical activity.  Eating a healthy diet.  Avoiding tobacco and drug use.  Limiting alcohol use.  Practicing safe sex.  Taking low-dose aspirin every day.  Taking vitamin and mineral supplements as recommended by your health care provider. What happens during an annual well check? The services and screenings done by your health care provider during your annual well check will depend on your age, overall health, lifestyle risk factors, and family history of disease. Counseling  Your health care provider may ask you questions about your:  Alcohol  use.  Tobacco use.  Drug use.  Emotional well-being.  Home and relationship well-being.  Sexual activity.  Eating habits.  History of falls.  Memory and ability to understand (cognition).  Work and work Statistician.  Reproductive health. Screening  You may have the following tests or measurements:  Height, weight, and BMI.  Blood pressure.  Lipid and cholesterol levels. These may be checked every 5 years, or more frequently if you are over 41 years old.  Skin check.  Lung cancer screening. You may have this screening every year starting at age 30 if you have a 30-pack-year history of smoking and currently smoke or have quit within the past 15 years.  Fecal occult blood test (FOBT) of the stool. You may have this test every year starting at age 48.  Flexible sigmoidoscopy or colonoscopy. You may have a sigmoidoscopy every 5 years or a colonoscopy every 10 years starting at age 26.  Hepatitis C blood test.  Hepatitis B blood test.  Sexually transmitted disease (STD) testing.  Diabetes screening. This is done by checking your blood sugar (glucose) after you have not eaten for a while (fasting). You may have this done every 1-3 years.  Bone density scan. This is done to screen for osteoporosis. You may have this done starting at age 76.  Mammogram. This may be done every 1-2 years. Talk to your health care provider about how often you should have regular mammograms. Talk with your health care provider about your test results, treatment options, and if necessary, the need for more tests. Vaccines  Your health care provider may recommend certain vaccines, such  as:  Influenza vaccine. This is recommended every year.  Tetanus, diphtheria, and acellular pertussis (Tdap, Td) vaccine. You may need a Td booster every 10 years.  Zoster vaccine. You may need this after age 40.  Pneumococcal 13-valent conjugate (PCV13) vaccine. One dose is recommended after age  76.  Pneumococcal polysaccharide (PPSV23) vaccine. One dose is recommended after age 76. Talk to your health care provider about which screenings and vaccines you need and how often you need them. This information is not intended to replace advice given to you by your health care provider. Make sure you discuss any questions you have with your health care provider. Document Released: 12/21/2015 Document Revised: 08/13/2016 Document Reviewed: 09/25/2015 Elsevier Interactive Patient Education  2017 [place] Prevention in the Home Falls can cause injuries. They can happen to people of all ages. There are many things you can do to make your home safe and to help prevent falls. What can I do on the outside of my home?  Regularly fix the edges of walkways and driveways and fix any cracks.  Remove anything that might make you trip as you walk through a door, such as a raised step or threshold.  Trim any bushes or trees on the path to your home.  Use bright outdoor lighting.  Clear any walking paths of anything that might make someone trip, such as rocks or tools.  Regularly check to see if handrails are loose or broken. Make sure that both sides of any steps have handrails.  Any raised decks and porches should have guardrails on the edges.  Have any leaves, snow, or ice cleared regularly.  Use sand or salt on walking paths during winter.  Clean up any spills in your garage right away. This includes oil or grease spills. What can I do in the bathroom?  Use night lights.  Install grab bars by the toilet and in the tub and shower. Do not use towel bars as grab bars.  Use non-skid mats or decals in the tub or shower.  If you need to sit down in the shower, use a plastic, non-slip stool.  Keep the floor dry. Clean up any water that spills on the floor as soon as it happens.  Remove soap buildup in the tub or shower regularly.  Attach bath mats securely with double-sided  non-slip rug tape.  Do not have throw rugs and other things on the floor that can make you trip. What can I do in the bedroom?  Use night lights.  Make sure that you have a light by your bed that is easy to reach.  Do not use any sheets or blankets that are too big for your bed. They should not hang down onto the floor.  Have a firm chair that has side arms. You can use this for support while you get dressed.  Do not have throw rugs and other things on the floor that can make you trip. What can I do in the kitchen?  Clean up any spills right away.  Avoid walking on wet floors.  Keep items that you use a lot in easy-to-reach places.  If you need to reach something above you, use a strong step stool that has a grab bar.  Keep electrical cords out of the way.  Do not use floor polish or wax that makes floors slippery. If you must use wax, use non-skid floor wax.  Do not have throw rugs and other things on the  floor that can make you trip. What can I do with my stairs?  Do not leave any items on the stairs.  Make sure that there are handrails on both sides of the stairs and use them. Fix handrails that are broken or loose. Make sure that handrails are as long as the stairways.  Check any carpeting to make sure that it is firmly attached to the stairs. Fix any carpet that is loose or worn.  Avoid having throw rugs at the top or bottom of the stairs. If you do have throw rugs, attach them to the floor with carpet tape.  Make sure that you have a light switch at the top of the stairs and the bottom of the stairs. If you do not have them, ask someone to add them for you. What else can I do to help prevent falls?  Wear shoes that:  Do not have high heels.  Have rubber bottoms.  Are comfortable and fit you well.  Are closed at the toe. Do not wear sandals.  If you use a stepladder:  Make sure that it is fully opened. Do not climb a closed stepladder.  Make sure that both  sides of the stepladder are locked into place.  Ask someone to hold it for you, if possible.  Clearly mark and make sure that you can see:  Any grab bars or handrails.  First and last steps.  Where the edge of each step is.  Use tools that help you move around (mobility aids) if they are needed. These include:  Canes.  Walkers.  Scooters.  Crutches.  Turn on the lights when you go into a dark area. Replace any light bulbs as soon as they burn out.  Set up your furniture so you have a clear path. Avoid moving your furniture around.  If any of your floors are uneven, fix them.  If there are any pets around you, be aware of where they are.  Review your medicines with your doctor. Some medicines can make you feel dizzy. This can increase your chance of falling. Ask your doctor what other things that you can do to help prevent falls. This information is not intended to replace advice given to you by your health care provider. Make sure you discuss any questions you have with your health care provider. Document Released: 09/20/2009 Document Revised: 05/01/2016 Document Reviewed: 12/29/2014 Elsevier Interactive Patient Education  2017 [employer].

## 2020-05-01 ENCOUNTER — Other Ambulatory Visit: Payer: Self-pay | Admitting: Internal Medicine

## 2020-06-27 ENCOUNTER — Other Ambulatory Visit: Payer: Self-pay | Admitting: Internal Medicine

## 2020-06-28 ENCOUNTER — Encounter: Payer: Medicare HMO | Admitting: Internal Medicine

## 2020-06-29 ENCOUNTER — Other Ambulatory Visit: Payer: Medicare HMO

## 2020-06-29 ENCOUNTER — Ambulatory Visit: Payer: Medicare HMO | Admitting: Internal Medicine

## 2020-07-12 ENCOUNTER — Encounter: Payer: Medicare HMO | Admitting: Internal Medicine

## 2020-07-13 DIAGNOSIS — H25013 Cortical age-related cataract, bilateral: Secondary | ICD-10-CM | POA: Diagnosis not present

## 2020-07-13 DIAGNOSIS — H401231 Low-tension glaucoma, bilateral, mild stage: Secondary | ICD-10-CM | POA: Diagnosis not present

## 2020-07-13 DIAGNOSIS — H2513 Age-related nuclear cataract, bilateral: Secondary | ICD-10-CM | POA: Diagnosis not present

## 2020-07-16 ENCOUNTER — Encounter: Payer: Self-pay | Admitting: Internal Medicine

## 2020-07-16 ENCOUNTER — Ambulatory Visit (INDEPENDENT_AMBULATORY_CARE_PROVIDER_SITE_OTHER): Payer: Medicare HMO | Admitting: Internal Medicine

## 2020-07-16 ENCOUNTER — Other Ambulatory Visit: Payer: Self-pay

## 2020-07-16 VITALS — BP 130/70 | HR 93 | Temp 97.5°F | Resp 18 | Ht 65.0 in | Wt 254.0 lb

## 2020-07-16 DIAGNOSIS — I10 Essential (primary) hypertension: Secondary | ICD-10-CM | POA: Diagnosis not present

## 2020-07-16 DIAGNOSIS — Z0000 Encounter for general adult medical examination without abnormal findings: Secondary | ICD-10-CM

## 2020-07-16 DIAGNOSIS — E7800 Pure hypercholesterolemia, unspecified: Secondary | ICD-10-CM | POA: Diagnosis not present

## 2020-07-16 DIAGNOSIS — F0150 Vascular dementia without behavioral disturbance: Secondary | ICD-10-CM | POA: Diagnosis not present

## 2020-07-16 DIAGNOSIS — Z1159 Encounter for screening for other viral diseases: Secondary | ICD-10-CM

## 2020-07-16 DIAGNOSIS — N182 Chronic kidney disease, stage 2 (mild): Secondary | ICD-10-CM | POA: Diagnosis not present

## 2020-07-16 DIAGNOSIS — Z66 Do not resuscitate: Secondary | ICD-10-CM

## 2020-07-16 DIAGNOSIS — H6122 Impacted cerumen, left ear: Secondary | ICD-10-CM | POA: Diagnosis not present

## 2020-07-16 DIAGNOSIS — R739 Hyperglycemia, unspecified: Secondary | ICD-10-CM

## 2020-07-16 DIAGNOSIS — Z6841 Body Mass Index (BMI) 40.0 and over, adult: Secondary | ICD-10-CM | POA: Diagnosis not present

## 2020-07-16 NOTE — Progress Notes (Signed)
Provider:  Rexene [name]. [name], D.O., C.M.D. Location:   [facility]  Place of Service:   clinic  Previous PCP: [name] Curry, DO Patient Care Team: [name] Curry, DO as PCP - General (Geriatric Medicine) [name] Fam, MD as Consulting Physician (Ophthalmology)  Extended Emergency Contact Information Primary Emergency Contact: [name],[name] Address: [address], [place] [address] [name] of [place] Phone: [phone] Mobile Phone: [phone] Relation: Daughter  Code Status: DNR Goals of Care: Advanced Directive information Advanced Directives 07/16/2020  Does Patient Have a Medical Advance Directive? No  Type of Advance Directive -  Does patient want to make changes to medical advance directive? -  Copy of [place] in Chart? -  Would patient like information on creating a medical advance directive? No - Patient declined  Pre-existing out of facility DNR order (yellow form or pink MOST form) -   Chief Complaint  Patient presents with  . Annual Exam    Physical Examination.    HPI: Patient is a 74 y.o. female seen today for an annual physical exam.  [name] is doing well.  Her daughter has no new concerns.  See ROS below.  She has lost 25 lbs since last visit due to changes in eating habits.  Appetite remains good.     Past Medical History:  Diagnosis Date  . Cataract    slow growing   . Chronic kidney disease   . Hyperlipidemia   . Hypertension   . Insomnia, unspecified   . Memory loss    mild dementia per daughter   . Osteopenia   . Other abnormal blood chemistry   . Other specified cardiac dysrhythmias(427.89)   . Other specified disease of nail   . Stroke [facility])    2006 or 2007 per daughter   . Syncope and collapse   . Unspecified disorder of kidney and ureter   . Unspecified late effects of cerebrovascular disease   . Unspecified transient cerebral ischemia   . Unspecified urinary incontinence   . Unspecified  vitamin D deficiency   . Vascular dementia, uncomplicated [facility])    Past Surgical History:  Procedure Laterality Date  . NO PAST SURGERIES      reports that she has quit smoking. She quit after 30.00 years of use. She has never used smokeless tobacco. She reports that she does not drink alcohol and does not use drugs.  Functional Status Survey:    Family History  Problem Relation Age of Onset  . Pneumonia Mother   . Kidney disease Sister   . Hypertension Brother   . Colon cancer Neg Hx   . Colon polyps Neg Hx   . Esophageal cancer Neg Hx   . Rectal cancer Neg Hx   . Stomach cancer Neg Hx     Health Maintenance  Topic Date Due  . INFLUENZA VACCINE  07/08/2020  . Hepatitis C Screening  12/28/2020 (Originally 03-23-1946)  . TETANUS/TDAP  05/06/2021  . DEXA SCAN  Completed  . COVID-19 Vaccine  Completed  . PNA vac Low Risk Adult  Completed    No Known Allergies  Outpatient Encounter Medications as of 07/16/2020  Medication Sig  . amLODipine (NORVASC) 10 MG tablet TAKE 1 TABLET DAILY FOR BLOOD PRESSURE  . aspirin EC 81 MG tablet Take 81 mg by mouth daily.  . cholecalciferol (VITAMIN D) 1000 units tablet Take 1,000 Units by mouth daily.  . hydrALAZINE (APRESOLINE) 25 MG  tablet TAKE 1 TABLET THREE TIMES DAILY WITH MEALS TO CONTROL  BLOOD PRESSURE  . losartan (COZAAR) 50 MG tablet TAKE 1 TABLET ONE TIME DAILY FOR BLOOD PRESSURE  . Multiple Vitamin (MULTIVITAMIN) tablet Take 1 tablet by mouth daily.  Marland Kitchen NAMZARIC 28-10 MG CP24 TAKE 1 CAPSULE  DAILY TO PRESERVE MEMORY  . oxybutynin (DITROPAN) 5 MG tablet TAKE 1 TABLET ONE TIME DAILY FOR BLADDER CONTROL  . simvastatin (ZOCOR) 40 MG tablet TAKE 1 TABLET EVERY DAY AT 6PM   No facility-administered encounter medications on file as of 07/16/2020.    Review of Systems  Constitutional: Negative for chills and fever.  HENT: Positive for hearing loss. Negative for congestion and sore throat.        Due to cerumen left more than right   Eyes: Negative for blurred vision.  Respiratory: Negative for shortness of breath.   Cardiovascular: Negative for chest pain, palpitations and leg swelling.  Gastrointestinal: Negative for abdominal pain, blood in stool, constipation, diarrhea and melena.  Genitourinary: Positive for urgency. Negative for dysuria.       Incontinence  Musculoskeletal: Negative for falls and joint pain.  Skin: Negative for itching and rash.  Neurological: Negative for dizziness and loss of consciousness.  Endo/Heme/Allergies: Does not bruise/bleed easily.  Psychiatric/Behavioral: Positive for memory loss. Negative for depression. The patient is not nervous/anxious and does not have insomnia.     Vitals:   07/16/20 0928  BP: 130/70  Pulse: 93  Resp: 18  Temp: (!) 97.5 F (36.4 C)  SpO2: 94%  Weight: 254 lb (115.2 kg)  Height: 5\' 5"  (1.651 m)   Body mass index is 42.27 kg/m. Physical Exam Vitals reviewed.  Constitutional:      General: She is not in acute distress.    Appearance: Normal appearance. She is not toxic-appearing.  HENT:     Head: Normocephalic and atraumatic.     Right Ear: Tympanic membrane, ear canal and external ear normal.     Left Ear: External ear normal. There is impacted cerumen.     Nose: No congestion.     Mouth/Throat:     Pharynx: Oropharynx is clear.  Cardiovascular:     Rate and Rhythm: Normal rate and regular rhythm.     Pulses: Normal pulses.     Heart sounds: Normal heart sounds.  Pulmonary:     Effort: Pulmonary effort is normal.     Breath sounds: Normal breath sounds. No wheezing, rhonchi or rales.  Chest:     Breasts:        Right: Normal. No swelling, bleeding, inverted nipple, mass, nipple discharge, skin change or tenderness.        Left: Normal. No swelling, bleeding, inverted nipple, mass, nipple discharge, skin change or tenderness.  Abdominal:     General: Bowel sounds are normal. There is no distension.     Palpations: Abdomen is soft.      Tenderness: There is no abdominal tenderness. There is no guarding or rebound.  Musculoskeletal:        General: Normal range of motion.     Cervical back: No tenderness.     Right lower leg: No edema.     Left lower leg: No edema.  Lymphadenopathy:     Cervical: No cervical adenopathy.     Upper Body:     Right upper body: No supraclavicular, axillary or pectoral adenopathy.     Left upper body: No supraclavicular, axillary or pectoral adenopathy.  Skin:  General: Skin is warm and dry.  Neurological:     General: No focal deficit present.     Mental Status: She is alert. Mental status is at baseline.     Cranial Nerves: No cranial nerve deficit.     Motor: Weakness present.     Gait: Gait abnormal.     Comments: Walks w/o assistive device  Psychiatric:        Mood and Affect: Mood normal.     Labs reviewed: Basic Metabolic Panel: Recent Labs    12/29/19 0951  NA 142  K 4.4  CL 106  CO2 24  GLUCOSE 118*  BUN 18  CREATININE 1.30*  CALCIUM 9.8   Liver Function Tests: Recent Labs    12/29/19 0951  AST 16  ALT 13  BILITOT 0.5  PROT 7.1   No results for input(s): LIPASE, AMYLASE in the last 8760 hours. No results for input(s): AMMONIA in the last 8760 hours. CBC: Recent Labs    12/29/19 0951  WBC 8.2  NEUTROABS 4,485  HGB 14.0  HCT 42.4  MCV 85.5  PLT 280   Cardiac Enzymes: No results for input(s): CKTOTAL, CKMB, CKMBINDEX, TROPONINI in the last 8760 hours. BNP: Invalid input(s): POCBNP Lab Results  Component Value Date   HGBA1C 5.7 (H) 12/29/2019   Lab Results  Component Value Date   TSH 1.51 12/29/2019   No results found for: VITAMINB12 No results found for: FOLATE  Assessment/Plan 1. Annual physical exam -performed today -up to date except hep c screen and flu shot for this season which we do not yet have to administer  2. Vascular dementia, uncomplicated ([place]) -stable, no new concerns from [place] -is maintained on namzaric  3.  Hearing loss of left ear due to cerumen impaction -flushed with warm water and peroxide, but large chunk still required instrumentation with plastic curette and tweezers to extract it successfully--TM then visible and normal  4. Essential hypertension -bp at goal with amlodipine, hydrazaline, losartan  5. Stage 2 chronic kidney disease -Avoid nephrotoxic agents like nsaids, dose adjust renally excreted meds, hydrate. -f/u bmp   6. Hyperglycemia -has lost weight so expect improvement  7. Pure hypercholesterolemia -was good last time, cont zocor, f/u flp   8. Morbid obesity ([place]) -weight improved considerably since last time, continue to improve diet  Labs/tests ordered:   Lab Orders     CBC with Differential/Platelet     COMPLETE METABOLIC PANEL WITH GFR     Hemoglobin A1c     Lipid panel     Hepatitis C antibody Come for labs next week sometime (currently no phlebotomist and didn't want to go to offsite draw center)  F/u with me in 6 mos for med mgt  Rodneisha Bonnet L. Nkenge Sonntag, D.O. [facility] [address], [place] [address] Cell Phone (Mon-Fri 8am-5pm):  [phone] On Call:  [phone] & follow prompts after 5pm & weekends Office Phone:  [phone] Office Fax:  [phone]

## 2020-08-03 ENCOUNTER — Other Ambulatory Visit: Payer: Self-pay

## 2020-08-03 ENCOUNTER — Other Ambulatory Visit: Payer: Medicare HMO

## 2020-08-03 DIAGNOSIS — E7800 Pure hypercholesterolemia, unspecified: Secondary | ICD-10-CM

## 2020-08-03 DIAGNOSIS — Z1159 Encounter for screening for other viral diseases: Secondary | ICD-10-CM

## 2020-08-03 DIAGNOSIS — Z0000 Encounter for general adult medical examination without abnormal findings: Secondary | ICD-10-CM | POA: Diagnosis not present

## 2020-08-03 DIAGNOSIS — R739 Hyperglycemia, unspecified: Secondary | ICD-10-CM

## 2020-08-03 DIAGNOSIS — F0150 Vascular dementia without behavioral disturbance: Secondary | ICD-10-CM | POA: Diagnosis not present

## 2020-08-03 DIAGNOSIS — N182 Chronic kidney disease, stage 2 (mild): Secondary | ICD-10-CM | POA: Diagnosis not present

## 2020-08-06 LAB — LIPID PANEL
Cholesterol: 177 mg/dL (ref <200)
HDL: 73 mg/dL (ref >=50)
LDL Cholesterol (Calc): 91 mg/dL (calc)
Non-HDL Cholesterol (Calc): 104 mg/dL (calc) (ref <130)
Total CHOL/HDL Ratio: 2.4 (calc) (ref <5.0)
Triglycerides: 51 mg/dL (ref <150)

## 2020-08-06 LAB — COMPLETE METABOLIC PANEL WITH GFR
AG Ratio: 1.3 (calc) (ref 1.0–2.5)
ALT: 16 U/L (ref 6–29)
AST: 17 U/L (ref 10–35)
Albumin: 4.2 g/dL (ref 3.6–5.1)
Alkaline phosphatase (APISO): 95 U/L (ref 37–153)
BUN/Creatinine Ratio: 14 (calc) (ref 6–22)
BUN: 19 mg/dL (ref 7–25)
CO2: 26 mmol/L (ref 20–32)
Calcium: 9.6 mg/dL (ref 8.6–10.4)
Chloride: 104 mmol/L (ref 98–110)
Creat: 1.36 mg/dL — ABNORMAL HIGH (ref 0.60–0.93)
GFR, Est African American: 44 mL/min/{1.73_m2} — ABNORMAL LOW (ref >=60)
GFR, Est Non African American: 38 mL/min/{1.73_m2} — ABNORMAL LOW (ref >=60)
Globulin: 3.3 g/dL (calc) (ref 1.9–3.7)
Glucose, Bld: 112 mg/dL — ABNORMAL HIGH (ref 65–99)
Potassium: 4.1 mmol/L (ref 3.5–5.3)
Sodium: 141 mmol/L (ref 135–146)
Total Bilirubin: 0.7 mg/dL (ref 0.2–1.2)
Total Protein: 7.5 g/dL (ref 6.1–8.1)

## 2020-08-06 LAB — CBC WITH DIFFERENTIAL/PLATELET
Absolute Monocytes: 749 cells/uL (ref 200–950)
Basophils Absolute: 29 cells/uL (ref 0–200)
Basophils Relative: 0.3 %
Eosinophils Absolute: 86 cells/uL (ref 15–500)
Eosinophils Relative: 0.9 %
HCT: 45.9 % — ABNORMAL HIGH (ref 35.0–45.0)
Hemoglobin: 14.7 g/dL (ref 11.7–15.5)
Lymphs Abs: 4877 cells/uL — ABNORMAL HIGH (ref 850–3900)
MCH: 27.9 pg (ref 27.0–33.0)
MCHC: 32 g/dL (ref 32.0–36.0)
MCV: 87.3 fL (ref 80.0–100.0)
MPV: 10.2 fL (ref 7.5–12.5)
Monocytes Relative: 7.8 %
Neutro Abs: 3859 cells/uL (ref 1500–7800)
Neutrophils Relative %: 40.2 %
Platelets: 269 10*3/uL (ref 140–400)
RBC: 5.26 10*6/uL — ABNORMAL HIGH (ref 3.80–5.10)
RDW: 13.1 % (ref 11.0–15.0)
Total Lymphocyte: 50.8 %
WBC: 9.6 10*3/uL (ref 3.8–10.8)

## 2020-08-06 LAB — HEMOGLOBIN A1C
Hgb A1c MFr Bld: 5.5 % of total Hgb (ref <5.7)
Mean Plasma Glucose: 111 (calc)
eAG (mmol/L): 6.2 (calc)

## 2020-08-06 LAB — HEPATITIS C ANTIBODY
Hepatitis C Ab: NONREACTIVE
SIGNAL TO CUT-OFF: 0.03 (ref <1.00)

## 2020-08-06 NOTE — Progress Notes (Signed)
Cholesterol has trended up over goal of less than 70 for the LDL.  Recommend cutting back on fatty foods and sauces. Good news is that sugar average is back in normal range. Kidneys appear stable. Liver and electrolytes are normal. Blood count is actually a little on the high side which I'll keep watch on

## 2020-09-10 ENCOUNTER — Other Ambulatory Visit: Payer: Self-pay | Admitting: Internal Medicine

## 2020-10-16 ENCOUNTER — Ambulatory Visit
Admission: EM | Admit: 2020-10-16 | Discharge: 2020-10-16 | Disposition: A | Discharge status: Home / Self Care | Payer: Medicare HMO | Attending: Emergency Medicine | Admitting: Emergency Medicine

## 2020-10-16 DIAGNOSIS — M436 Torticollis: Secondary | ICD-10-CM | POA: Diagnosis not present

## 2020-10-16 MED ORDER — TIZANIDINE HCL 2 MG PO TABS: 2.0000 mg | ORAL_TABLET | Freq: Four times a day (QID) | ORAL | 0 refills | Status: AC | PRN

## 2020-10-16 NOTE — ED Triage Notes (Signed)
Per daughter pt has had neck pain/stiffness x5-6 days. States used salonpas patches and pain cream with no relief. Denies falls or injuries.

## 2020-10-16 NOTE — ED Provider Notes (Signed)
[facility]    CSN: [id number] Arrival date & time: 10/16/20  1412      History   Chief Complaint Chief Complaint  Patient presents with  . Neck Pain    HPI [name] is a 74 y.o. female  Presenting with her daughter for left neck pain and stiffness for the last 5-6 days.  Has tried Salonpas with and pain cream without relief.  No fall, injury, upper extremity numbness, weakness.  No chest pain, difficulty breathing.  Past Medical History:  Diagnosis Date  . Cataract    slow growing   . Chronic kidney disease   . Hyperlipidemia   . Hypertension   . Insomnia, unspecified   . Memory loss    mild dementia per daughter   . Osteopenia   . Other abnormal blood chemistry   . Other specified cardiac dysrhythmias(427.89)   . Other specified disease of nail   . Stroke [facility])    2006 or 2007 per daughter   . Syncope and collapse   . Unspecified disorder of kidney and ureter   . Unspecified late effects of cerebrovascular disease   . Unspecified transient cerebral ischemia   . Unspecified urinary incontinence   . Unspecified vitamin D deficiency   . Vascular dementia, uncomplicated [facility])     Patient Active Problem List   Diagnosis Date Noted  . BMI 40.0-44.9, adult ([place]) 06/27/2019  . Urge urinary incontinence 03/03/2016  . Hyperglycemia 03/03/2016  . Vascular dementia, uncomplicated ([place])   . Hyperlipidemia   . Chronic kidney disease   . Hypertension     Past Surgical History:  Procedure Laterality Date  . NO PAST SURGERIES      OB History   No obstetric history on file.      Home Medications    Prior to Admission medications   Medication Sig Start Date End Date Taking? Authorizing Provider  amLODipine (NORVASC) 10 MG tablet TAKE 1 TABLET DAILY FOR BLOOD PRESSURE 06/27/20   Reed, Tiffany L, DO  aspirin EC 81 MG tablet Take 81 mg by mouth daily.    [provider]  cholecalciferol (VITAMIN D) 1000 units tablet Take 1,000 Units by  mouth daily.    [provider]  hydrALAZINE (APRESOLINE) 25 MG tablet TAKE 1 TABLET THREE TIMES DAILY WITH MEALS TO CONTROL BLOOD PRESSURE 09/10/20   Reed, Tiffany L, DO  losartan (COZAAR) 50 MG tablet TAKE 1 TABLET ONE TIME DAILY FOR BLOOD PRESSURE 04/20/20   Reed, Tiffany L, DO  Multiple Vitamin (MULTIVITAMIN) tablet Take 1 tablet by mouth daily.    [provider]  NAMZARIC 28-10 MG CP24 TAKE 1 CAPSULE  DAILY TO PRESERVE MEMORY 05/01/20   Reed, Tiffany L, DO  oxybutynin (DITROPAN) 5 MG tablet TAKE 1 TABLET ONE TIME DAILY FOR BLADDER CONTROL 09/10/20   Reed, Tiffany L, DO  simvastatin (ZOCOR) 40 MG tablet TAKE 1 TABLET EVERY DAY AT [facility] 11/07/19   Reed, Tiffany L, DO  tiZANidine (ZANAFLEX) 2 MG tablet Take 1 tablet (2 mg total) by mouth every 6 (six) hours as needed for up to 5 days for muscle spasms. 10/16/20 10/21/20  Hall-Potvin, [place], PA-C    Family History Family History  Problem Relation Age of Onset  . Pneumonia Mother   . Kidney disease Sister   . Hypertension Brother   . Colon cancer Neg Hx   . Colon polyps Neg Hx   . Esophageal cancer Neg Hx   . Rectal cancer  Neg Hx   . Stomach cancer Neg Hx     Social History Social History   Tobacco Use  . Smoking status: Former Smoker    Years: 30.00  . Smokeless tobacco: Never Used  . Tobacco comment: Quit about 7-8 years ago as of 2015   Substance Use Topics  . Alcohol use: No  . Drug use: No     Allergies   Patient has no known allergies.   Review of Systems Review of Systems  Constitutional: Negative for fatigue and fever.  Respiratory: Negative for cough and shortness of breath.   Cardiovascular: Negative for chest pain and palpitations.  Musculoskeletal: Positive for neck pain and neck stiffness. Negative for arthralgias, back pain, gait problem, joint swelling and myalgias.  Neurological: Negative for weakness and numbness.     Physical Exam Triage Vital Signs ED Triage Vitals [10/16/20 1444]   Enc Vitals Group     BP (!) 170/90     Pulse Rate 99     Resp 20     Temp (!) 97.4 F (36.3 C)     Temp Source Oral     SpO2 92 %     Weight      Height      Head Circumference      Peak Flow      Pain Score 5     Pain Loc      Pain Edu?      Excl. in [place]?    No data found.  Updated Vital Signs BP (!) 170/90 (BP Location: Left Arm)   Pulse 99   Temp (!) 97.4 F (36.3 C) (Oral)   Resp 20   SpO2 92%   Visual Acuity Right Eye Distance:   Left Eye Distance:   Bilateral Distance:    Right Eye Near:   Left Eye Near:    Bilateral Near:     Physical Exam Constitutional:      General: She is not in acute distress. HENT:     Head: Normocephalic and atraumatic.     Right Ear: Tympanic membrane and ear canal normal.     Left Ear: Tympanic membrane and ear canal normal.  Eyes:     General: No scleral icterus.    Pupils: Pupils are equal, round, and reactive to light.  Neck:     Comments: Decreased neck flexion to left.  Left trapezius tenderness without crepitus. Cardiovascular:     Rate and Rhythm: Normal rate and regular rhythm.  Pulmonary:     Effort: Pulmonary effort is normal. No respiratory distress.     Breath sounds: No wheezing.  Musculoskeletal:        General: No swelling or tenderness. Normal range of motion.     Cervical back: Neck supple. No rigidity.     Comments: Upper extremities bilaterally.  Neurovascular intact  Lymphadenopathy:     Cervical: No cervical adenopathy.  Skin:    Coloration: Skin is not jaundiced or pale.  Neurological:     Mental Status: She is alert and oriented to person, place, and time.      UC Treatments / Results  Labs (all labs ordered are listed, but only abnormal results are displayed) Labs Reviewed - No data to display  EKG   Radiology No results found.  Procedures Procedures (including critical care time)  Medications Ordered in UC Medications - No data to display  Initial Impression / Assessment and  Plan / UC Course  I have reviewed  the triage vital signs and the nursing notes.  Pertinent labs & imaging results that were available during my care of the patient were reviewed by me and considered in my medical decision making (see chart for details).     Left neck tenderness without spinous process tenderness.  Possible torticollis.  Will treat supportively as below, follow-up with PCP for further evaluation management.  Return precautions discussed, pt and daughter verbalized understanding and are agreeable to plan. Final Clinical Impressions(s) / UC Diagnoses   Final diagnoses:  Neck stiffness   Discharge Instructions   None    ED Prescriptions    Medication Sig Dispense Auth. Provider   tiZANidine (ZANAFLEX) 2 MG tablet Take 1 tablet (2 mg total) by mouth every 6 (six) hours as needed for up to 5 days for muscle spasms. 20 tablet Hall-Potvin, [place], PA-C     I have reviewed the PDMP during this encounter.   Hall-Potvin, [place], [place] 10/16/20 1824

## 2020-11-09 ENCOUNTER — Other Ambulatory Visit: Payer: Self-pay | Admitting: Internal Medicine

## 2020-11-22 ENCOUNTER — Ambulatory Visit (INDEPENDENT_AMBULATORY_CARE_PROVIDER_SITE_OTHER): Payer: Medicare HMO | Admitting: Family

## 2020-11-22 ENCOUNTER — Ambulatory Visit
Admission: RE | Admit: 2020-11-22 | Discharge: 2020-11-22 | Disposition: A | Discharge status: Home / Self Care | Payer: Medicare HMO | Source: Ambulatory Visit | Attending: Family | Admitting: Family

## 2020-11-22 ENCOUNTER — Other Ambulatory Visit: Payer: Self-pay

## 2020-11-22 ENCOUNTER — Encounter: Payer: Self-pay | Admitting: Family

## 2020-11-22 VITALS — BP 140/90 | HR 91 | Temp 98.4°F | Resp 16 | Ht 65.0 in | Wt 257.6 lb

## 2020-11-22 DIAGNOSIS — Z8673 Personal history of transient ischemic attack (TIA), and cerebral infarction without residual deficits: Secondary | ICD-10-CM | POA: Diagnosis not present

## 2020-11-22 DIAGNOSIS — Z23 Encounter for immunization: Secondary | ICD-10-CM

## 2020-11-22 DIAGNOSIS — R2681 Unsteadiness on feet: Secondary | ICD-10-CM | POA: Diagnosis not present

## 2020-11-22 DIAGNOSIS — M62838 Other muscle spasm: Secondary | ICD-10-CM

## 2020-11-22 DIAGNOSIS — M542 Cervicalgia: Secondary | ICD-10-CM | POA: Diagnosis not present

## 2020-11-22 MED ORDER — ACETAMINOPHEN 500 MG PO TABS: 500.0000 mg | ORAL_TABLET | Freq: Four times a day (QID) | ORAL | 0 refills | Status: DC | PRN

## 2020-11-22 MED ORDER — TIZANIDINE HCL 2 MG PO TABS: 2.0000 mg | ORAL_TABLET | Freq: Four times a day (QID) | ORAL | 0 refills | Status: DC | PRN

## 2020-11-22 NOTE — Progress Notes (Signed)
Provider: Felis Quillin FNP-C  [name], DO  Patient Care Team: [name], DO as PCP - General (Geriatric Medicine) [name], MD as Consulting Physician (Ophthalmology)  Extended Emergency Contact Information Primary Emergency Contact: Bourque,[name] Address: [address], [place] [address] [name] [place] Phone: [phone] Mobile Phone: [phone] Relation: Daughter  Code Status:  Full Code  Goals of care: Advanced Directive information Advanced Directives 11/22/2020  Does Patient Have a Medical Advance Directive? No  Type of Advance Directive -  Does patient want to make changes to medical advance directive? -  Copy of [place] in Chart? -  Would patient like information on creating a medical advance directive? No - Patient declined  Pre-existing out of facility DNR order (yellow form or pink MOST form) -     Chief Complaint  Patient presents with  . Acute Visit    Complains of neck pain.    HPI:  Pt is a 74 y.o. female seen today for an acute visit for evaluation of neck pain x 1 month.She is here with her daughter who provides additional HPI information.she was seen at [facility] at [facility] for left neck pain on 10/16/2020.She had neck pain for 5-6 days prior to going to urgent care.Had used Salonpas with pain cream without any relief.she was treated supportively with tizanidine 2 mg tablet every 6 Hrs PRN for possible torticollis and advised to follow up with PCP.Tizanidine helped same.Pain is not constant.when sitting on chair she has no pain but worst when lying down. she cannot lie down on the bed sleeps on recliner.  Had similar pain one year ago.Salonpas worked but this time didn't help.Pain worst on left side behind the ear but it's been on both sides.able to turn but hurt. She slid down on the bed six weeks ago.she did not hit her head just sat on the floor. She denies any  shoulder pain or back pain. No headaches,change in vision.No weakness,Numbness or tingling on the upper extremities.     Past Medical History:  Diagnosis Date  . Cataract    slow growing   . Chronic kidney disease   . Hyperlipidemia   . Hypertension   . Insomnia, unspecified   . Memory loss    mild dementia per daughter   . Osteopenia   . Other abnormal blood chemistry   . Other specified cardiac dysrhythmias(427.89)   . Other specified disease of nail   . Stroke [facility])    2006 or 2007 per daughter   . Syncope and collapse   . Unspecified disorder of kidney and ureter   . Unspecified late effects of cerebrovascular disease   . Unspecified transient cerebral ischemia   . Unspecified urinary incontinence   . Unspecified vitamin D deficiency   . Vascular dementia, uncomplicated [facility])    Past Surgical History:  Procedure Laterality Date  . NO PAST SURGERIES      No Known Allergies  Outpatient Encounter Medications as of 11/22/2020  Medication Sig  . amLODipine (NORVASC) 10 MG tablet TAKE 1 TABLET DAILY FOR BLOOD PRESSURE  . aspirin EC 81 MG tablet Take 81 mg by mouth daily.  . cholecalciferol (VITAMIN D) 1000 units tablet Take 1,000 Units by mouth daily.  . hydrALAZINE (APRESOLINE) 25 MG tablet TAKE 1 TABLET THREE TIMES DAILY WITH MEALS TO CONTROL BLOOD PRESSURE  . losartan (COZAAR) 50 MG tablet TAKE 1 TABLET  ONE TIME DAILY FOR BLOOD PRESSURE  . Multiple Vitamin (MULTIVITAMIN) tablet Take 1 tablet by mouth daily.  Marland Kitchen NAMZARIC 28-10 MG CP24 TAKE 1 CAPSULE  DAILY TO PRESERVE MEMORY  . oxybutynin (DITROPAN) 5 MG tablet TAKE 1 TABLET ONE TIME DAILY FOR BLADDER CONTROL  . simvastatin (ZOCOR) 40 MG tablet TAKE 1 TABLET EVERY DAY AT 6PM  . [DISCONTINUED] tiZANidine (ZANAFLEX) 2 MG tablet Take 2 mg by mouth every 6 (six) hours as needed for muscle spasms.  Marland Kitchen acetaminophen (TYLENOL) 500 MG tablet Take 1 tablet (500 mg total) by mouth every 6 (six) hours as needed for mild pain.  Marland Kitchen  tiZANidine (ZANAFLEX) 2 MG tablet Take 1 tablet (2 mg total) by mouth every 6 (six) hours as needed for muscle spasms.   No facility-administered encounter medications on file as of 11/22/2020.    Review of Systems  Constitutional: Negative for appetite change, chills, fatigue and fever.  Respiratory: Negative for cough, chest tightness, shortness of breath and wheezing.   Musculoskeletal: Positive for arthralgias, neck pain and neck stiffness. Negative for back pain.  Skin: Negative for color change, pallor and rash.  Neurological: Negative for dizziness, speech difficulty, weakness, light-headedness, numbness and headaches.    Immunization History  Administered Date(s) Administered  . Influenza, High Dose Seasonal PF 12/25/2017, 08/19/2018  . Influenza,inj,Quad PF,6+ Mos 10/17/2013, 12/07/2015, 09/01/2016  . Influenza-Unspecified 08/25/2008, 09/12/2011, 09/27/2012  . PFIZER SARS-COV-2 Vaccination 12/27/2019, 01/17/2020  . Pneumococcal Conjugate-13 08/04/2014  . Pneumococcal-Unspecified 05/07/2011  . Tdap 05/07/2011   Pertinent  Health Maintenance Due  Topic Date Due  . INFLUENZA VACCINE  07/08/2020  . DEXA SCAN  Completed  . PNA vac Low Risk Adult  Completed   Fall Risk  11/22/2020 07/16/2020 04/23/2020 12/29/2019 06/27/2019  Falls in the past year? 1 0 0 0 0  Number falls in past yr: 0 0 0 0 0  Injury with Fall? 0 0 0 1 0  Risk Factor Category  - - - - -  Risk for fall due to : - - - - -  Follow up - - - - -   Functional Status Survey:    Vitals:   11/22/20 1002  BP: 140/90  Pulse: 91  Resp: 16  Temp: 98.4 F (36.9 C)  SpO2: 94%  Weight: 257 lb 9.6 oz (116.8 kg)  Height: 5\' 5"  (1.651 m)   Body mass index is 42.87 kg/m. Physical Exam Vitals reviewed.  Constitutional:      General: She is not in acute distress.    Appearance: She is obese. She is not ill-appearing.  HENT:     Ears:     Comments: Bilateral ear canal partially impacted.    Nose: Nose normal. No  congestion or rhinorrhea.     Mouth/Throat:     Mouth: Mucous membranes are moist.     Pharynx: Oropharynx is clear. No oropharyngeal exudate or posterior oropharyngeal erythema.  Eyes:     General: No scleral icterus.       Right eye: No discharge.        Left eye: No discharge.     Conjunctiva/sclera: Conjunctivae normal.     Pupils: Pupils are equal, round, and reactive to light.  Neck:     Vascular: No carotid bruit.   Cardiovascular:     Rate and Rhythm: Normal rate and regular rhythm.     Pulses: Normal pulses.     Heart sounds: Normal heart sounds. No murmur heard. No friction rub.  No gallop.   Pulmonary:     Effort: Pulmonary effort is normal. No respiratory distress.     Breath sounds: Normal breath sounds. No wheezing, rhonchi or rales.  Chest:     Chest wall: No tenderness.  Musculoskeletal:     Cervical back: Torticollis and tenderness present. No erythema or crepitus. Pain with movement and muscular tenderness present. No spinous process tenderness. Decreased range of motion.  Lymphadenopathy:     Cervical: No cervical adenopathy.  Skin:    General: Skin is warm and dry.     Coloration: Skin is not pale.     Findings: No bruising, erythema or rash.  Neurological:     Mental Status: She is alert. Mental status is at baseline.     Cranial Nerves: No cranial nerve deficit.     Motor: No weakness.     Coordination: Coordination normal.     Gait: Gait abnormal.  Psychiatric:        Mood and Affect: Mood normal.        Behavior: Behavior normal.        Thought Content: Thought content normal.        Judgment: Judgment normal.     Labs reviewed: Recent Labs    12/29/19 0951 08/03/20 0823  NA 142 141  K 4.4 4.1  CL 106 104  CO2 24 26  GLUCOSE 118* 112*  BUN 18 19  CREATININE 1.30* 1.36*  CALCIUM 9.8 9.6   Recent Labs    12/29/19 0951 08/03/20 0823  AST 16 17  ALT 13 16  BILITOT 0.5 0.7  PROT 7.1 7.5   Recent Labs    12/29/19 0951  08/03/20 0823  WBC 8.2 9.6  NEUTROABS 4,485 3,859  HGB 14.0 14.7  HCT 42.4 45.9*  MCV 85.5 87.3  PLT 280 269   Lab Results  Component Value Date   TSH 1.51 12/29/2019   Lab Results  Component Value Date   HGBA1C 5.5 08/03/2020   Lab Results  Component Value Date   CHOL 177 08/03/2020   HDL 73 08/03/2020   LDLCALC 91 08/03/2020   TRIG 51 08/03/2020   CHOLHDL 2.4 08/03/2020    Significant Diagnostic Results in last 30 days:  No results found.  Assessment/Plan 1. 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  - Advised to take Extra strength tylenol as needed for pain.Avoid NSAID's like Ibuprofen,Aleve or Naproxen due to her decline renal function.  - Apply warm compressor for 10 -15 minutes to affected area daily as needed.Place compressor over a towel and not direct on the skin to prevent burns.  - acetaminophen (TYLENOL) 500 MG tablet; Take 1 tablet (500 mg total) by mouth every 6 (six) hours as needed for mild pain.  Dispense: 30 tablet; Refill: 0 - tiZANidine (ZANAFLEX) 2 MG tablet; Take 1 tablet (2 mg total) by mouth every 6 (six) hours as needed for muscle spasms.  Dispense: 30 tablet; Refill: 0 - Ambulatory referral to [facility] for ROM,Exericise and muscle strengthening.  - [facility] Cervical Spine Complete; Future - Advised to get X-ray at [place] [facility] over [address].then will call you with results  2. Neck muscle spasm - warm compressor as above. - Physical therapy as above - tiZANidine (ZANAFLEX) 2 MG tablet; Take 1 tablet (2 mg total) by mouth every 6 (six) hours as needed for  muscle spasms.  Dispense: 30 tablet; Refill: 0 - Ambulatory referral to [facility]ervical Sp[facility] Complete; Future  3. Unsteady gait - Fall and safety precaution. - Ambulatory referral to Home Health: PT for gait stability,ROM and muscle  strengthening.  4. History of stroke Right side weakness. - Ambulatory referral to Home Health Physical Therapy.  5. Need for influenza vaccination Afebrile.Asymptomatic.Flu vaccine administered by CMA without any reaction.  - Flu Vaccine QUAD High Dose(Fluad)  Family/ staff Communication: Reviewed plan of care with patient and daughter  Labs/tests ordered: - [facility] Cervical Spine Complete; Future  Next Appointment: As needed if symptoms worsen or fail to improve.   Sandrea Hughs, NP

## 2020-11-22 NOTE — Patient Instructions (Signed)
-   Please get X-ray at [place] at [facility] over [address].then will call you with results.  - Apply heat compressor on the neck for pain relief for 10-15 minutes..Avoid applying direct on the skin use a towel.

## 2020-12-18 ENCOUNTER — Other Ambulatory Visit: Payer: Self-pay | Admitting: Internal Medicine

## 2021-01-17 ENCOUNTER — Ambulatory Visit: Payer: Medicare HMO | Admitting: Internal Medicine

## 2021-01-28 ENCOUNTER — Encounter: Payer: Self-pay | Admitting: Internal Medicine

## 2021-02-04 ENCOUNTER — Other Ambulatory Visit: Payer: Self-pay | Admitting: Internal Medicine

## 2021-02-07 ENCOUNTER — Ambulatory Visit: Payer: Medicare HMO | Admitting: Internal Medicine

## 2021-02-28 ENCOUNTER — Ambulatory Visit (INDEPENDENT_AMBULATORY_CARE_PROVIDER_SITE_OTHER): Payer: Medicare HMO | Admitting: Family

## 2021-02-28 ENCOUNTER — Encounter: Payer: Self-pay | Admitting: Family

## 2021-02-28 ENCOUNTER — Ambulatory Visit: Payer: Medicare HMO | Admitting: Internal Medicine

## 2021-02-28 ENCOUNTER — Other Ambulatory Visit: Payer: Self-pay

## 2021-02-28 VITALS — BP 150/88 | HR 91 | Temp 97.1°F | Resp 18 | Ht 65.0 in | Wt 247.4 lb

## 2021-02-28 DIAGNOSIS — L602 Onychogryphosis: Secondary | ICD-10-CM | POA: Diagnosis not present

## 2021-02-28 DIAGNOSIS — I10 Essential (primary) hypertension: Secondary | ICD-10-CM | POA: Diagnosis not present

## 2021-02-28 DIAGNOSIS — E7800 Pure hypercholesterolemia, unspecified: Secondary | ICD-10-CM

## 2021-02-28 DIAGNOSIS — F0150 Vascular dementia without behavioral disturbance: Secondary | ICD-10-CM | POA: Diagnosis not present

## 2021-02-28 DIAGNOSIS — R739 Hyperglycemia, unspecified: Secondary | ICD-10-CM

## 2021-02-28 MED ORDER — LOSARTAN POTASSIUM 50 MG PO TABS
75.0000 mg | ORAL_TABLET | Freq: Every day | ORAL | 1 refills | Status: DC
Start: 2021-02-28 — End: 2021-03-11

## 2021-02-28 NOTE — Patient Instructions (Signed)
- check blood pressure daily and record.Notify provider for B/p > 140/90    [email or web address]">  DASH Eating Plan DASH stands for Dietary Approaches to Stop Hypertension. The DASH eating plan is a healthy eating plan that has been shown to:  Reduce high blood pressure (hypertension).  Reduce your risk for type 2 diabetes, heart disease, and stroke.  Help with weight loss. What are tips for following this plan? Reading food labels  Check food labels for the amount of salt (sodium) per serving. Choose foods with less than 5 percent of the Daily Value of sodium. Generally, foods with less than 300 milligrams (mg) of sodium per serving fit into this eating plan.  To find whole grains, look for the word "whole" as the first word in the ingredient list. Shopping  Buy products labeled as "low-sodium" or "no salt added."  Buy fresh foods. Avoid canned foods and pre-made or frozen meals. Cooking  Avoid adding salt when cooking. Use salt-free seasonings or herbs instead of table salt or sea salt. Check with your health care provider or pharmacist before using salt substitutes.  Do not fry foods. Cook foods using healthy methods such as baking, boiling, grilling, roasting, and broiling instead.  Cook with heart-healthy oils, such as olive, canola, avocado, soybean, or sunflower oil. Meal planning  Eat a balanced diet that includes: ? 4 or more servings of fruits and 4 or more servings of vegetables each day. Try to fill one-half of your plate with fruits and vegetables. ? 6-8 servings of whole grains each day. ? Less than 6 oz (170 g) of lean meat, poultry, or fish each day. A 3-oz (85-g) serving of meat is about the same size as a deck of cards. One egg equals 1 oz (28 g). ? 2-3 servings of low-fat dairy each day. One serving is 1 cup (237 mL). ? 1 serving of nuts, seeds, or beans 5 times each week. ? 2-3 servings of heart-healthy fats.  Healthy fats called omega-3 fatty acids are found in foods such as walnuts, flaxseeds, fortified milks, and eggs. These fats are also found in cold-water fish, such as sardines, salmon, and mackerel.  Limit how much you eat of: ? Canned or prepackaged foods. ? Food that is high in trans fat, such as some fried foods. ? Food that is high in saturated fat, such as fatty meat. ? Desserts and other sweets, sugary drinks, and other foods with added sugar. ? Full-fat dairy products.  Do not salt foods before eating.  Do not eat more than 4 egg yolks a week.  Try to eat at least 2 vegetarian meals a week.  Eat more home-cooked food and less restaurant, buffet, and fast food.   Lifestyle  When eating at a restaurant, ask that your food be prepared with less salt or no salt, if possible.  If you drink alcohol: ? Limit how much you use to:  0-1 drink a day for women who are not pregnant.  0-2 drinks a day for men. ? Be aware of how much alcohol is in your drink. In the [place] one drink equals one 12 oz bottle of beer (355 mL), one 5 oz glass of wine (148 mL), or one 1 oz glass of hard liquor (44 mL). General information  Avoid eating more than 2,300 mg of salt a day. If you have hypertension, you may need to reduce your sodium intake to 1,500 mg a day.  Work with your health care provider to maintain  a healthy body weight or to lose weight. Ask what an ideal weight is for you.  Get at least 30 minutes of exercise that causes your heart to beat faster (aerobic exercise) most days of the week. Activities may include walking, swimming, or biking.  Work with your health care provider or dietitian to adjust your eating plan to your individual calorie needs. What foods should I eat? Fruits All fresh, dried, or frozen fruit. Canned fruit in natural juice (without added sugar). Vegetables Fresh or frozen vegetables (raw, steamed, roasted, or grilled). Low-sodium or reduced-sodium tomato and  vegetable juice. Low-sodium or reduced-sodium tomato sauce and tomato paste. Low-sodium or reduced-sodium canned vegetables. Grains Whole-grain or whole-wheat bread. Whole-grain or whole-wheat pasta. Brown rice. [place]. Bulgur. Whole-grain and low-sodium cereals. Pita bread. Low-fat, low-sodium crackers. Whole-wheat flour tortillas. Meats and other proteins Skinless chicken or [place]. Ground chicken or [place]. Pork with fat trimmed off. Fish and seafood. Egg whites. Dried beans, peas, or lentils. Unsalted nuts, nut butters, and seeds. Unsalted canned beans. Lean cuts of beef with fat trimmed off. Low-sodium, lean precooked or cured meat, such as sausages or meat loaves. Dairy Low-fat (1%) or fat-free (skim) milk. Reduced-fat, low-fat, or fat-free cheeses. Nonfat, low-sodium ricotta or cottage cheese. Low-fat or nonfat yogurt. Low-fat, low-sodium cheese. Fats and oils Soft margarine without trans fats. Vegetable oil. Reduced-fat, low-fat, or light mayonnaise and salad dressings (reduced-sodium). Canola, safflower, olive, avocado, soybean, and sunflower oils. Avocado. Seasonings and condiments Herbs. Spices. Seasoning mixes without salt. Other foods Unsalted popcorn and pretzels. Fat-free sweets. The items listed above may not be a complete list of foods and beverages you can eat. Contact a dietitian for more information. What foods should I avoid? Fruits Canned fruit in a light or heavy syrup. Fried fruit. Fruit in cream or butter sauce. Vegetables Creamed or fried vegetables. Vegetables in a cheese sauce. Regular canned vegetables (not low-sodium or reduced-sodium). Regular canned tomato sauce and paste (not low-sodium or reduced-sodium). Regular tomato and vegetable juice (not low-sodium or reduced-sodium). Angie Fava. Olives. Grains Baked goods made with fat, such as croissants, muffins, or some breads. Dry pasta or rice meal packs. Meats and other proteins Fatty cuts of meat. Ribs.  Fried meat. Berniece Salines. Bologna, salami, and other precooked or cured meats, such as sausages or meat loaves. Fat from the back of a pig (fatback). Bratwurst. Salted nuts and seeds. Canned beans with added salt. Canned or smoked fish. Whole eggs or egg yolks. Chicken or [place] with skin. Dairy Whole or 2% milk, cream, and half-and-half. Whole or full-fat cream cheese. Whole-fat or sweetened yogurt. Full-fat cheese. Nondairy creamers. Whipped toppings. Processed cheese and cheese spreads. Fats and oils Butter. Stick margarine. Lard. Shortening. Ghee. Bacon fat. Tropical oils, such as coconut, palm kernel, or palm oil. Seasonings and condiments Onion salt, garlic salt, seasoned salt, table salt, and sea salt. Worcestershire sauce. Tartar sauce. Barbecue sauce. Teriyaki sauce. Soy sauce, including reduced-sodium. Steak sauce. Canned and packaged gravies. Fish sauce. Oyster sauce. Cocktail sauce. Store-bought horseradish. Ketchup. Mustard. Meat flavorings and tenderizers. Bouillon cubes. Hot sauces. Pre-made or packaged marinades. Pre-made or packaged taco seasonings. Relishes. Regular salad dressings. Other foods Salted popcorn and pretzels. The items listed above may not be a complete list of foods and beverages you should avoid. Contact a dietitian for more information. Where to find more information  National Heart, Lung, and Blood Institute: https://wilson-eaton.com/  American Heart Association: www.heart.org  Academy of Nutrition and Dietetics: www.eatright.[place]: www.kidney.org Summary  The DASH eating plan is a healthy eating plan that has been shown to reduce high blood pressure (hypertension). It may also reduce your risk for type 2 diabetes, heart disease, and stroke.  When on the DASH eating plan, aim to eat more fresh fruits and vegetables, whole grains, lean proteins, low-fat dairy, and heart-healthy fats.  With the DASH eating plan, you should limit salt (sodium)  intake to 2,300 mg a day. If you have hypertension, you may need to reduce your sodium intake to 1,500 mg a day.  Work with your health care provider or dietitian to adjust your eating plan to your individual calorie needs. This information is not intended to replace advice given to you by your health care provider. Make sure you discuss any questions you have with your health care provider. Document Revised: 10/28/2019 Document Reviewed: 10/28/2019 Elsevier Patient Education  2021 [employer].

## 2021-02-28 NOTE — Progress Notes (Signed)
Provider: Marlowe Sax FNP-C   Dishawn Bhargava, Nelda Bucks, NP  Patient Care Team: Naziah Weckerly, Nelda Bucks, NP as PCP - General (Family Medicine) [name], MD as Consulting Physician (Ophthalmology)  Extended Emergency Contact Information Primary Emergency Contact: Hickle,[name] Address: [address] [place] [address] [name] of [place] Phone: [phone] Mobile Phone: [phone] Relation: Daughter  Code Status: Full Code  Goals of care: Advanced Directive information Advanced Directives 02/28/2021  Does Patient Have a Medical Advance Directive? No  Type of Advance Directive -  Does patient want to make changes to medical advance directive? -  Copy of [place] in Chart? -  Would patient like information on creating a medical advance directive? No - Patient declined  Pre-existing out of facility DNR order (yellow form or pink MOST form) -     Chief Complaint  Patient presents with  . Medical Management of Chronic Issues    6 month follow up.  . Immunizations    Discuss the need for Covid Booster.    HPI:  Pt is a 75 y.o. female seen today for 6 months follow up for medical management  of chronic diseases.she is here with her daughter.states assist her with her ADL's.Does own dressing but requires assistance with showers.No behavioral issues reported.No weight loss or fall episodes.States eats a lot.she denies any acute issues during visit.Daughter notes right wrist puffy than the left.No pain or tenderness or redness.   Hypertension - does not check her blood pressure at home but does have a blood pressure cuff.denies any signs or symptoms of hypo/hypertension.  Hyperlipidemia - heart healthy diet and avoiding sodas discussed." they tell me I like to eat a lot".Hearthy snacking discussed. Has a leg bike paddle that she rides for few minutes.Not riding daily.Needs reminding from family.     Past Medical History:  Diagnosis Date  .  Cataract    slow growing   . Chronic kidney disease   . Hyperlipidemia   . Hypertension   . Insomnia, unspecified   . Memory loss    mild dementia per daughter   . Osteopenia   . Other abnormal blood chemistry   . Other specified cardiac dysrhythmias(427.89)   . Other specified disease of nail   . Stroke [facility])    2006 or 2007 per daughter   . Syncope and collapse   . Unspecified disorder of kidney and ureter   . Unspecified late effects of cerebrovascular disease   . Unspecified transient cerebral ischemia   . Unspecified urinary incontinence   . Unspecified vitamin D deficiency   . Vascular dementia, uncomplicated [facility])    Past Surgical History:  Procedure Laterality Date  . NO PAST SURGERIES      No Known Allergies  Allergies as of 02/28/2021   No Known Allergies     Medication List       Accurate as of February 28, 2021  2:34 PM. If you have any questions, ask your nurse or doctor.        acetaminophen 500 MG tablet Commonly known as: TYLENOL Take 1 tablet (500 mg total) by mouth every 6 (six) hours as needed for mild pain.   amLODipine 10 MG tablet Commonly known as: NORVASC TAKE 1 TABLET DAILY FOR BLOOD PRESSURE   aspirin EC 81 MG tablet Take 81 mg by mouth daily.   cholecalciferol 1000 units tablet Commonly known as: VITAMIN D Take 1,000 Units by mouth daily.  hydrALAZINE 25 MG tablet Commonly known as: APRESOLINE TAKE 1 TABLET THREE TIMES DAILY WITH MEALS TO CONTROL BLOOD PRESSURE   losartan 50 MG tablet Commonly known as: COZAAR TAKE 1 TABLET ONE TIME DAILY FOR BLOOD PRESSURE   multivitamin tablet Take 1 tablet by mouth daily.   Namzaric 28-10 MG Cp24 Generic drug: Memantine HCl-Donepezil HCl TAKE 1 CAPSULE  DAILY TO PRESERVE MEMORY   oxybutynin 5 MG tablet Commonly known as: DITROPAN TAKE 1 TABLET ONE TIME DAILY FOR BLADDER CONTROL   simvastatin 40 MG tablet Commonly known as: ZOCOR TAKE 1 TABLET EVERY DAY AT 6PM   tiZANidine 2 MG  tablet Commonly known as: ZANAFLEX Take 1 tablet (2 mg total) by mouth every 6 (six) hours as needed for muscle spasms.       Review of Systems  Constitutional: Negative for appetite change, chills, fatigue and fever.  HENT: Negative for congestion, hearing loss, rhinorrhea, sinus pressure, sinus pain, sneezing and sore throat.   Eyes: Negative for discharge, redness, itching and visual disturbance.  Respiratory: Negative for cough, chest tightness, shortness of breath and wheezing.   Cardiovascular: Negative for chest pain, palpitations and leg swelling.  Gastrointestinal: Negative for abdominal distention, abdominal pain, constipation, diarrhea, nausea and vomiting.  Endocrine: Negative for cold intolerance, heat intolerance, polydipsia, polyphagia and polyuria.  Genitourinary: Negative for difficulty urinating, dysuria, flank pain, frequency and urgency.  Musculoskeletal: Negative for arthralgias, gait problem, joint swelling and myalgias.  Skin: Negative for color change, pallor and rash.  Neurological: Negative for dizziness, speech difficulty, weakness, light-headedness, numbness and headaches.  Hematological: Does not bruise/bleed easily.  Psychiatric/Behavioral: Negative for agitation, confusion and sleep disturbance. The patient is not nervous/anxious.     Immunization History  Administered Date(s) Administered  . Fluad Quad(high Dose 65+) 11/22/2020  . Influenza, High Dose Seasonal PF 12/25/2017, 08/19/2018  . Influenza,inj,Quad PF,6+ Mos 10/17/2013, 12/07/2015, 09/01/2016  . Influenza-Unspecified 08/25/2008, 09/12/2011, 09/27/2012  . PFIZER(Purple Top)SARS-COV-2 Vaccination 12/27/2019, 01/17/2020  . Pneumococcal Conjugate-13 08/04/2014  . Pneumococcal-Unspecified 05/07/2011  . Tdap 05/07/2011   Pertinent  Health Maintenance Due  Topic Date Due  . INFLUENZA VACCINE  Completed  . DEXA SCAN  Completed  . PNA vac Low Risk Adult  Completed   Fall Risk  02/28/2021  11/22/2020 07/16/2020 04/23/2020 12/29/2019  Falls in the past year? 0 1 0 0 0  Number falls in past yr: 0 0 0 0 0  Injury with Fall? 0 0 0 0 1  Risk Factor Category  - - - - -  Risk for fall due to : - - - - -  Follow up - - - - -   Functional Status Survey:    Vitals:   02/28/21 1413  BP: (!) 150/88  Pulse: 91  Resp: 18  Temp: (!) 97.1 F (36.2 C)  SpO2: 94%  Weight: 247 lb 6.4 oz (112.2 kg)  Height: '5\' 5"'  (1.651 m)   Body mass index is 41.17 kg/m. Physical Exam Vitals reviewed.  Constitutional:      General: She is not in acute distress.    Appearance: She is morbidly obese. She is not ill-appearing.  HENT:     Head: Normocephalic.     Right Ear: Tympanic membrane, ear canal and external ear normal. There is no impacted cerumen.     Left Ear: Tympanic membrane, ear canal and external ear normal. There is no impacted cerumen.     Nose: Nose normal. No congestion or rhinorrhea.  Mouth/Throat:     Mouth: Mucous membranes are moist.     Pharynx: Oropharynx is clear. No oropharyngeal exudate or posterior oropharyngeal erythema.  Eyes:     General: No scleral icterus.       Right eye: No discharge.        Left eye: No discharge.     Extraocular Movements: Extraocular movements intact.     Conjunctiva/sclera: Conjunctivae normal.     Pupils: Pupils are equal, round, and reactive to light.  Neck:     Vascular: No carotid bruit.  Cardiovascular:     Rate and Rhythm: Normal rate and regular rhythm.     Pulses: Normal pulses.     Heart sounds: Normal heart sounds. No murmur heard. No friction rub. No gallop.   Pulmonary:     Effort: Pulmonary effort is normal. No respiratory distress.     Breath sounds: Normal breath sounds. No wheezing, rhonchi or rales.  Chest:     Chest wall: No tenderness.  Abdominal:     General: Bowel sounds are normal. There is no distension.     Palpations: Abdomen is soft. There is no mass.     Tenderness: There is no abdominal tenderness.  There is no right CVA tenderness, left CVA tenderness, guarding or rebound.  Musculoskeletal:        General: No swelling or tenderness.     Cervical back: Normal range of motion. No rigidity or tenderness.     Right lower leg: No edema.     Left lower leg: No edema.     Right foot: Normal range of motion and normal capillary refill. No swelling, tenderness or crepitus. Normal pulse.     Left foot: Normal range of motion and normal capillary refill. No swelling, tenderness or crepitus. Normal pulse.  Feet:     Right foot:     Skin integrity: Skin integrity normal.     Toenail Condition: Right toenails are long.     Left foot:     Skin integrity: Skin integrity normal.     Toenail Condition: Left toenails are long.  Lymphadenopathy:     Cervical: No cervical adenopathy.  Skin:    General: Skin is warm and dry.     Coloration: Skin is not pale.     Findings: No bruising, erythema or rash.  Neurological:     Mental Status: She is alert and oriented to person, place, and time.     Cranial Nerves: No cranial nerve deficit.     Sensory: No sensory deficit.     Motor: No weakness.     Coordination: Coordination normal.     Gait: Gait abnormal.  Psychiatric:        Mood and Affect: Mood normal.        Speech: Speech normal.        Behavior: Behavior normal.        Cognition and Memory: Memory is impaired.     Labs reviewed: Recent Labs    08/03/20 0823  NA 141  K 4.1  CL 104  CO2 26  GLUCOSE 112*  BUN 19  CREATININE 1.36*  CALCIUM 9.6   Recent Labs    08/03/20 0823  AST 17  ALT 16  BILITOT 0.7  PROT 7.5   Recent Labs    08/03/20 0823  WBC 9.6  NEUTROABS 3,859  HGB 14.7  HCT 45.9*  MCV 87.3  PLT 269   Lab Results  Component Value Date  TSH 1.51 12/29/2019   Lab Results  Component Value Date   HGBA1C 5.5 08/03/2020   Lab Results  Component Value Date   CHOL 177 08/03/2020   HDL 73 08/03/2020   LDLCALC 91 08/03/2020   TRIG 51 08/03/2020   CHOLHDL  2.4 08/03/2020    Significant Diagnostic Results in last 30 days:  No results found.  Assessment/Plan  1. Essential hypertension B/p slightly elevated today.Encouraged to check B/P at home and record and notify provider if B/p > 140/90  - will increase losartan from 50 mg tablet to 75 mg tablet daily.Advised to taking one and a half of the current tablets that she has for a total of 75 mg tablet daughter verbalized understanding. - continue on amlodipine and Hydralazine  - continue on ASA  - additional education information provided on AVS - CBC with Differential/Platelet; Future - CMP with eGFR([facility]); Future - TSH; Future - losartan (COZAAR) 50 MG tablet; Take 1.5 tablets (75 mg total) by mouth daily.  Dispense: 90 tablet; Refill: 1  2. Pure hypercholesterolemia LDL at goal. -continue on simvastatin  - Lipid panel; Future  3. Hyperglycemia Lab Results  Component Value Date   HGBA1C 5.5 08/03/2020  Latest Hgb A1C nomal previous was high.  Dietary modification and exercise discussed. - Hemoglobin A1c; Future  4. Vascular dementia, uncomplicated ([place]) No new behavioral issues. - continue with supportive care - continue on Namzaric   5. Overgrown toenails Overgrown toenails noted on exam.Daughter states has a podiatrist that sees her on regular basis will schedule appointment.   Family/ staff Communication: Reviewed plan of care with patient and daughter   Labs/tests ordered:  - CBC with Differential/Platelet; Future - CMP with eGFR([facility]); Future - TSH; Future - Hemoglobin A1c; Future - Lipid panel; Future  Next Appointment : 6 months for medical management of chronic issues.Fasting labs in 1 month or sooner.Daughter states does not have time off to bring patient for lab work sooner.   Sandrea Hughs, NP

## 2021-03-11 ENCOUNTER — Other Ambulatory Visit: Payer: Self-pay | Admitting: Family

## 2021-03-11 ENCOUNTER — Other Ambulatory Visit: Payer: Self-pay | Admitting: Internal Medicine

## 2021-03-11 DIAGNOSIS — I10 Essential (primary) hypertension: Secondary | ICD-10-CM

## 2021-04-01 ENCOUNTER — Other Ambulatory Visit: Payer: Medicare HMO

## 2021-04-25 ENCOUNTER — Encounter: Payer: Medicare HMO | Admitting: Family

## 2021-04-29 ENCOUNTER — Other Ambulatory Visit: Payer: Self-pay

## 2021-04-29 DIAGNOSIS — R739 Hyperglycemia, unspecified: Secondary | ICD-10-CM

## 2021-04-29 DIAGNOSIS — E7800 Pure hypercholesterolemia, unspecified: Secondary | ICD-10-CM

## 2021-04-29 DIAGNOSIS — I10 Essential (primary) hypertension: Secondary | ICD-10-CM

## 2021-05-02 ENCOUNTER — Other Ambulatory Visit: Payer: Self-pay

## 2021-05-02 ENCOUNTER — Other Ambulatory Visit: Payer: Medicare HMO

## 2021-05-02 DIAGNOSIS — E7800 Pure hypercholesterolemia, unspecified: Secondary | ICD-10-CM | POA: Diagnosis not present

## 2021-05-02 DIAGNOSIS — R739 Hyperglycemia, unspecified: Secondary | ICD-10-CM | POA: Diagnosis not present

## 2021-05-02 DIAGNOSIS — I10 Essential (primary) hypertension: Secondary | ICD-10-CM | POA: Diagnosis not present

## 2021-05-03 DIAGNOSIS — H2513 Age-related nuclear cataract, bilateral: Secondary | ICD-10-CM | POA: Diagnosis not present

## 2021-05-03 DIAGNOSIS — H25013 Cortical age-related cataract, bilateral: Secondary | ICD-10-CM | POA: Diagnosis not present

## 2021-05-03 DIAGNOSIS — H401231 Low-tension glaucoma, bilateral, mild stage: Secondary | ICD-10-CM | POA: Diagnosis not present

## 2021-05-03 LAB — HEMOGLOBIN A1C
Hgb A1c MFr Bld: 5.4 % of total Hgb (ref <5.7)
Mean Plasma Glucose: 108 mg/dL
eAG (mmol/L): 6 mmol/L

## 2021-05-03 LAB — COMPLETE METABOLIC PANEL WITH GFR
AG Ratio: 1.3 (calc) (ref 1.0–2.5)
ALT: 12 U/L (ref 6–29)
AST: 13 U/L (ref 10–35)
Albumin: 4.2 g/dL (ref 3.6–5.1)
Alkaline phosphatase (APISO): 87 U/L (ref 37–153)
BUN/Creatinine Ratio: 13 (calc) (ref 6–22)
BUN: 18 mg/dL (ref 7–25)
CO2: 24 mmol/L (ref 20–32)
Calcium: 9.7 mg/dL (ref 8.6–10.4)
Chloride: 107 mmol/L (ref 98–110)
Creat: 1.35 mg/dL — ABNORMAL HIGH (ref 0.60–0.93)
GFR, Est African American: 44 mL/min/{1.73_m2} — ABNORMAL LOW (ref >=60)
GFR, Est Non African American: 38 mL/min/{1.73_m2} — ABNORMAL LOW (ref >=60)
Globulin: 3.2 g/dL (calc) (ref 1.9–3.7)
Glucose, Bld: 106 mg/dL — ABNORMAL HIGH (ref 65–99)
Potassium: 4.2 mmol/L (ref 3.5–5.3)
Sodium: 142 mmol/L (ref 135–146)
Total Bilirubin: 0.5 mg/dL (ref 0.2–1.2)
Total Protein: 7.4 g/dL (ref 6.1–8.1)

## 2021-05-03 LAB — LIPID PANEL
Cholesterol: 177 mg/dL (ref <200)
HDL: 70 mg/dL (ref >=50)
LDL Cholesterol (Calc): 93 mg/dL (calc)
Non-HDL Cholesterol (Calc): 107 mg/dL (calc) (ref <130)
Total CHOL/HDL Ratio: 2.5 (calc) (ref <5.0)
Triglycerides: 57 mg/dL (ref <150)

## 2021-05-03 LAB — CBC WITH DIFFERENTIAL/PLATELET
Absolute Monocytes: 716 cells/uL (ref 200–950)
Basophils Absolute: 19 cells/uL (ref 0–200)
Basophils Relative: 0.2 %
Eosinophils Absolute: 121 cells/uL (ref 15–500)
Eosinophils Relative: 1.3 %
HCT: 44.9 % (ref 35.0–45.0)
Hemoglobin: 14.1 g/dL (ref 11.7–15.5)
Lymphs Abs: 4548 cells/uL — ABNORMAL HIGH (ref 850–3900)
MCH: 27.2 pg (ref 27.0–33.0)
MCHC: 31.4 g/dL — ABNORMAL LOW (ref 32.0–36.0)
MCV: 86.5 fL (ref 80.0–100.0)
MPV: 10.2 fL (ref 7.5–12.5)
Monocytes Relative: 7.7 %
Neutro Abs: 3897 cells/uL (ref 1500–7800)
Neutrophils Relative %: 41.9 %
Platelets: 279 10*3/uL (ref 140–400)
RBC: 5.19 10*6/uL — ABNORMAL HIGH (ref 3.80–5.10)
RDW: 12.8 % (ref 11.0–15.0)
Total Lymphocyte: 48.9 %
WBC: 9.3 10*3/uL (ref 3.8–10.8)

## 2021-05-03 LAB — TSH: TSH: 1.43 mIU/L (ref 0.40–4.50)

## 2021-05-28 ENCOUNTER — Encounter: Payer: Self-pay | Admitting: Family

## 2021-05-28 ENCOUNTER — Other Ambulatory Visit: Payer: Self-pay

## 2021-05-28 ENCOUNTER — Ambulatory Visit (INDEPENDENT_AMBULATORY_CARE_PROVIDER_SITE_OTHER): Payer: Medicare HMO | Admitting: Family

## 2021-05-28 DIAGNOSIS — Z0000 Encounter for general adult medical examination without abnormal findings: Secondary | ICD-10-CM | POA: Diagnosis not present

## 2021-05-28 DIAGNOSIS — M85852 Other specified disorders of bone density and structure, left thigh: Secondary | ICD-10-CM

## 2021-05-28 DIAGNOSIS — Z1231 Encounter for screening mammogram for malignant neoplasm of breast: Secondary | ICD-10-CM | POA: Diagnosis not present

## 2021-05-28 DIAGNOSIS — Z23 Encounter for immunization: Secondary | ICD-10-CM | POA: Diagnosis not present

## 2021-05-28 MED ORDER — TETANUS-DIPHTH-ACELL PERTUSSIS 5-2.5-18.5 LF-MCG/0.5 IM SUSP: 0.5000 mL | Freq: Once | INTRAMUSCULAR | 0 refills | Status: AC

## 2021-05-28 NOTE — Progress Notes (Signed)
This service is provided via telemedicine  No vital signs collected/recorded due to the encounter was a telemedicine visit.   Location of patient (ex: home, work): Home.  Patient consents to a telephone visit: Yes.  Location of the provider (ex: office, home): [employer].   Name of any referring provider: Ileen Kahre, Nelda Bucks, NP   Names of all persons participating in the telemedicine service and their role in the encounter: Patient, [name], [place], [place], Webb Silversmith, NP.    Time spent on call: 8 minutes spent on the phone with Medical Assistant.      Subjective:   [name] is a 75 y.o. female who presents for Medicare Annual (Subsequent) preventive examination.  Review of Systems     Cardiac Risk Factors include: advanced age (>68men, >24 women);hypertension;obesity (BMI >30kg/m2);smoking/ tobacco exposure;dyslipidemia     Objective:    There were no vitals filed for this visit. There is no height or weight on file to calculate BMI.  Advanced Directives 05/28/2021 02/28/2021 11/22/2020 07/16/2020 04/23/2020 12/29/2019 04/19/2019  Does Patient Have a Medical Advance Directive? Yes No No No Yes Yes No  Type of Advance Directive Healthcare Power of [place] -  Does patient want to make changes to medical advance directive? No - Patient declined - - - No - Patient declined No - Patient declined -  Copy of [place] in Chart? No - copy requested - - - No - copy requested - -  Would patient like information on creating a medical advance directive? No - Patient declined No - Patient declined No - Patient declined No - Patient declined - - Yes (ED - Information included in AVS)  Pre-existing out of facility DNR order (yellow form or pink MOST form) - - - - - - -    Current Medications (verified) Outpatient Encounter Medications as of 05/28/2021  Medication Sig   amLODipine  (NORVASC) 10 MG tablet TAKE 1 TABLET DAILY FOR BLOOD PRESSURE   aspirin EC 81 MG tablet Take 81 mg by mouth daily.   cholecalciferol (VITAMIN D) 1000 units tablet Take 1,000 Units by mouth daily.   hydrALAZINE (APRESOLINE) 25 MG tablet TAKE 1 TABLET THREE TIMES DAILY WITH MEALS TO CONTROL BLOOD PRESSURE   losartan (COZAAR) 50 MG tablet TAKE 1 AND 1/2 TABLETS EVERY DAY   Multiple Vitamin (MULTIVITAMIN) tablet Take 1 tablet by mouth daily.   NAMZARIC 28-10 MG CP24 TAKE 1 CAPSULE  DAILY TO PRESERVE MEMORY   oxybutynin (DITROPAN) 5 MG tablet TAKE 1 TABLET ONE TIME DAILY FOR BLADDER CONTROL   simvastatin (ZOCOR) 40 MG tablet TAKE 1 TABLET EVERY DAY AT 6PM   [DISCONTINUED] acetaminophen (TYLENOL) 500 MG tablet Take 1 tablet (500 mg total) by mouth every 6 (six) hours as needed for mild pain.   [DISCONTINUED] tiZANidine (ZANAFLEX) 2 MG tablet Take 1 tablet (2 mg total) by mouth every 6 (six) hours as needed for muscle spasms.   No facility-administered encounter medications on file as of 05/28/2021.    Allergies (verified) Patient has no known allergies.   History: Past Medical History:  Diagnosis Date   Cataract    slow growing    Chronic kidney disease    Hyperlipidemia    Hypertension    Insomnia, unspecified    Memory loss    mild dementia per daughter    Osteopenia    Other abnormal blood chemistry  Other specified cardiac dysrhythmias(427.89)    Other specified disease of nail    Stroke ([place])    2006 or 2007 per daughter    Syncope and collapse    Unspecified disorder of kidney and ureter    Unspecified late effects of cerebrovascular disease    Unspecified transient cerebral ischemia    Unspecified urinary incontinence    Unspecified vitamin D deficiency    Vascular dementia, uncomplicated (HCC)    Past Surgical History:  Procedure Laterality Date   NO PAST SURGERIES     Family History  Problem Relation Age of Onset   Pneumonia Mother    Kidney disease Sister     Hypertension Brother    Colon cancer Neg Hx    Colon polyps Neg Hx    Esophageal cancer Neg Hx    Rectal cancer Neg Hx    Stomach cancer Neg Hx    Social History   Socioeconomic History   Marital status: Single    Spouse name: Not on file   Number of children: Not on file   Years of education: Not on file   Highest education level: Not on file  Occupational History   Not on file  Tobacco Use   Smoking status: Former    Years: 30.00    Pack years: 0.00    Types: Cigarettes   Smokeless tobacco: Never   Tobacco comments:    Quit about 7-8 years ago as of 2015   Substance and Sexual Activity   Alcohol use: No   Drug use: No   Sexual activity: Never  Other Topics Concern   Not on file  Social History Narrative   Not on file   Social Determinants of Health   Financial Resource Strain: Not on file  Food Insecurity: Not on file  Transportation Needs: Not on file  Physical Activity: Not on file  Stress: Not on file  Social Connections: Not on file    Tobacco Counseling Counseling given: Not Answered Tobacco comments: Quit about 7-8 years ago as of 2015    Clinical Intake:  Pre-visit preparation completed: No  Pain : No/denies pain     BMI - recorded: 41.17 Nutritional Status: BMI > 30  Obese Nutritional Risks: None Diabetes: No  How often do you need to have someone help you when you read instructions, pamphlets, or other written materials from your doctor or pharmacy?: 1 - Never What is the last grade level you completed in school?: 1 and 1/2 of College  Diabetic?No   Interpreter Needed?: No  Information entered by :: Katanya Schlie,FNP-C   Activities of Daily Living In your present state of health, do you have any difficulty performing the following activities: 05/28/2021  Hearing? N  Vision? Y  Comment has upcoming cataract  Difficulty concentrating or making decisions? Y  Comment momory issues  Walking or climbing stairs? N  Dressing or  bathing? N  Doing errands, shopping? N  Preparing Food and eating ? N  Using the Toilet? N  In the past six months, have you accidently leaked urine? Y  Comment wears depends  Do you have problems with loss of bowel control? N  Managing your Medications? Y  Comment daughter assist  Managing your Finances? Y  Comment daughter Teacher, English as a foreign language or managing your Housekeeping? Y  Comment daughter assist  Some recent data might be hidden    Patient Care Team: Clairissa Valvano, Nelda Bucks, NP as PCP - General (Family Medicine) [name], MD  as Consulting Physician (Ophthalmology)  Indicate any recent Medical Services you may have received from other than [facility] providers in the past year (date may be approximate).     Assessment:   This is a routine wellness examination for [name].  Hearing/Vision screen Hearing Screening - Comments:: No Hearing Concerns. Patient doesn't wear hearing aids.  Vision Screening - Comments:: No Vision Concerns. Patient doesn't wear prescription glasses. Patient last eye exam was May 27,2022  Dietary issues and exercise activities discussed: Current Exercise Habits: Home exercise routine, Type of exercise: walking, Time (Minutes): 10, Frequency (Times/Week): 2, Weekly Exercise (Minutes/Week): 20, Intensity: Mild, Exercise limited by: None identified   Goals Addressed             This Visit's Progress    Exercise 3x per week (30 min per time)   On track    Patient would like to walk for at least 20 minutes a day inside or outside the house      Weight (lb) < 200 lb (90.7 kg)       I would like to loss weight  4-5 lbs         Depression Screen PHQ 2/9 Scores 05/28/2021 04/23/2020 12/29/2019 06/27/2019 04/19/2019 08/19/2018 12/25/2017  PHQ - 2 Score 0 0 0 0 0 0 0    Fall Risk Fall Risk  05/28/2021 02/28/2021 11/22/2020 07/16/2020 04/23/2020  Falls in the past year? 1 0 1 0 0  Number falls in past yr: 0 0 0 0 0  Injury with Fall? 0 0 0 0 0  Risk Factor  Category  - - - - -  Risk for fall due to : No Fall Risks - - - -  Follow up - - - - -    [place]:  Any stairs in or around the home? Yes  If so, are there any without handrails? Yes  Home free of loose throw rugs in walkways, pet beds, electrical cords, etc? No  Adequate lighting in your home to reduce risk of falls? Yes   ASSISTIVE DEVICES UTILIZED TO PREVENT FALLS:  Life alert? No  Use of a cane, walker or w/c? No  Grab bars in the bathroom? Yes  Shower chair or bench in shower? Yes  Elevated toilet seat or a handicapped toilet? Yes   TIMED UP AND GO:  Was the test performed? No .  Length of time to ambulate 10 feet: N/A  sec.   Gait slow and steady without use of assistive device  Cognitive Function: MMSE - Mini Mental State Exam 12/25/2017 09/11/2016 08/06/2015  Orientation to time 4 3 5   Orientation to Place 4 4 3   Registration 3 3 3   Attention/ Calculation 5 5 5   Recall 0 0 0  Language- name 2 objects 2 2 2   Language- repeat 1 1 1   Language- follow 3 step command 3 3 3   Language- read & follow direction 1 1 1   Write a sentence 1 0 1  Copy design 0 0 0  Total score 24 22 24      6CIT Screen 05/28/2021 04/23/2020 04/19/2019  What Year? 0 points 4 points 0 points  What month? 3 points 0 points 0 points  What time? 0 points 0 points 0 points  Count back from 20 4 points 4 points 0 points  Months in reverse 4 points 4 points 4 points  Repeat phrase 10 points 10 points 10 points  Total Score 21 22  14    Immunizations Immunization History  Administered Date(s) Administered   Fluad Quad(high Dose 65+) 11/22/2020   Influenza, High Dose Seasonal PF 12/25/2017, 08/19/2018   Influenza,inj,Quad PF,6+ Mos 10/17/2013, 12/07/2015, 09/01/2016   Influenza-Unspecified 08/25/2008, 09/12/2011, 09/27/2012   PFIZER(Purple Top)SARS-COV-2 Vaccination 12/27/2019, 01/17/2020   Pneumococcal Conjugate-13 08/04/2014   Pneumococcal-Unspecified  05/07/2011   Tdap 05/07/2011    TDAP status: Due, Education has been provided regarding the importance of this vaccine. Advised may receive this vaccine at local pharmacy or Health Dept. Aware to provide a copy of the vaccination record if obtained from local pharmacy or Health Dept. Verbalized acceptance and understanding.  Flu Vaccine status: Up to date  Pneumococcal vaccine status: Up to date  Covid-19 vaccine status: Information provided on how to obtain vaccines.   Qualifies for Shingles Vaccine? Yes   Zostavax completed No   Shingrix Completed?: No.    Education has been provided regarding the importance of this vaccine. Patient has been advised to call insurance company to determine out of pocket expense if they have not yet received this vaccine. Advised may also receive vaccine at local pharmacy or Health Dept. Verbalized acceptance and understanding.  Screening Tests Health Maintenance  Topic Date Due   Zoster Vaccines- Shingrix (1 of 2) Never done   COVID-19 Vaccine (3 - Booster for Pfizer series) 06/15/2020   TETANUS/TDAP  05/06/2021   INFLUENZA VACCINE  07/08/2021   DEXA SCAN  Completed   Hepatitis C Screening  Completed   PNA vac Low Risk Adult  Completed   HPV VACCINES  Aged Out    Health Maintenance  Health Maintenance Due  Topic Date Due   Zoster Vaccines- Shingrix (1 of 2) Never done   COVID-19 Vaccine (3 - Booster for Pfizer series) 06/15/2020   TETANUS/TDAP  05/06/2021    Colorectal cancer screening: No longer required.   Mammogram status: Ordered today . Pt provided with contact info and advised to call to schedule appt.   Bone Density status: Completed 10/05/2017. Results reflect: Bone density results: OSTEOPENIA. Repeat every 2 years.  Lung Cancer Screening: (Low Dose CT Chest recommended if Age 48-80 years, 30 pack-year currently smoking OR have quit w/in 15years.) does not qualify.   Lung Cancer Screening Referral: No   Additional  Screening:  Hepatitis C Screening: does not qualify; Completed Yes   Vision Screening: Recommended annual ophthalmology exams for early detection of glaucoma and other disorders of the eye. Is the patient up to date with their annual eye exam?  Yes  Who is the provider or what is the name of the office in which the patient attends annual eye exams? Dr.Weaver  If pt is not established with a provider, would they like to be referred to a provider to establish care? No .   Dental Screening: Recommended annual dental exams for proper oral hygiene  Community Resource Referral / Chronic Care Management: CRR required this visit?  No   CCM required this visit?  No      Plan:   - Advised to get her Tdap and COVID-19 3 rd booster vaccine at her pharmacy. - Bone density  - mammogram   I have personally reviewed and noted the following in the patient's chart:   Medical and social history Use of alcohol, tobacco or illicit drugs  Current medications and supplements including opioid prescriptions.  Functional ability and status Nutritional status Physical activity Advanced directives List of other physicians Hospitalizations, surgeries, and ER visits in previous 54  months Vitals Screenings to include cognitive, depression, and falls Referrals and appointments  In addition, I have reviewed and discussed with patient certain preventive protocols, quality metrics, and best practice recommendations. A written personalized care plan for preventive services as well as general preventive health recommendations were provided to patient.     Sandrea Hughs, NP   05/28/2021   Nurse Notes: made aware [facility] center imaging will call to make appointment for bone density and Mammogram.

## 2021-05-28 NOTE — Patient Instructions (Signed)
[name], Thank you for taking time to come for your Medicare Wellness Visit. I appreciate your ongoing commitment to your health goals. Please review the following plan we discussed and let me know if I can assist you in the future.   Screening recommendations/referrals: Colonoscopy N/A  Mammogram : ordered today [facility] will call you for appointment  Bone Density Ordered today [facility] Center will call you for appointment  Recommended yearly ophthalmology/optometry visit for glaucoma screening and checkup Recommended yearly dental visit for hygiene and checkup  Vaccinations: Influenza vaccine: up to date  Pneumococcal vaccine Up to date  Tdap vaccine: Please get Tdap vaccine at your pharmacy  Shingles vaccine : Please get vaccine at your pharmacy    Advanced directives:   Conditions/risks identified: Advance age female > 84 yrs,Hypertension,Obesity BMI > 30,Hx of smoking ,dyslipidemia   Next appointment: 1 year    Preventive Care 65 Years and Older, Female Preventive care refers to lifestyle choices and visits with your health care provider that can promote health and wellness. What does preventive care include? A yearly physical exam. This is also called an annual well check. Dental exams once or twice a year. Routine eye exams. Ask your health care provider how often you should have your eyes checked. Personal lifestyle choices, including: Daily care of your teeth and gums. Regular physical activity. Eating a healthy diet. Avoiding tobacco and drug use. Limiting alcohol use. Practicing safe sex. Taking low-dose aspirin every day. Taking vitamin and mineral supplements as recommended by your health care provider. What happens during an annual well check? The services and screenings done by your health care provider during your annual well check will depend on your age, overall health, lifestyle risk factors, and family history of disease. Counseling   Your health care provider may ask you questions about your: Alcohol use. Tobacco use. Drug use. Emotional well-being. Home and relationship well-being. Sexual activity. Eating habits. History of falls. Memory and ability to understand (cognition). Work and work Statistician. Reproductive health. Screening  You may have the following tests or measurements: Height, weight, and BMI. Blood pressure. Lipid and cholesterol levels. These may be checked every 5 years, or more frequently if you are over 72 years old. Skin check. Lung cancer screening. You may have this screening every year starting at age 63 if you have a 30-pack-year history of smoking and currently smoke or have quit within the past 15 years. Fecal occult blood test (FOBT) of the stool. You may have this test every year starting at age 65. Flexible sigmoidoscopy or colonoscopy. You may have a sigmoidoscopy every 5 years or a colonoscopy every 10 years starting at age 15. Hepatitis C blood test. Hepatitis B blood test. Sexually transmitted disease (STD) testing. Diabetes screening. This is done by checking your blood sugar (glucose) after you have not eaten for a while (fasting). You may have this done every 1-3 years. Bone density scan. This is done to screen for osteoporosis. You may have this done starting at age 5. Mammogram. This may be done every 1-2 years. Talk to your health care provider about how often you should have regular mammograms. Talk with your health care provider about your test results, treatment options, and if necessary, the need for more tests. Vaccines  Your health care provider may recommend certain vaccines, such as: Influenza vaccine. This is recommended every year. Tetanus, diphtheria, and acellular pertussis (Tdap, Td) vaccine. You may need a Td booster every 10 years. Zoster  vaccine. You may need this after age 57. Pneumococcal 13-valent conjugate (PCV13) vaccine. One dose is recommended  after age 2. Pneumococcal polysaccharide (PPSV23) vaccine. One dose is recommended after age 33. Talk to your health care provider about which screenings and vaccines you need and how often you need them. This information is not intended to replace advice given to you by your health care provider. Make sure you discuss any questions you have with your health care provider. Document Released: 12/21/2015 Document Revised: 08/13/2016 Document Reviewed: 09/25/2015 Elsevier Interactive Patient Education  2017 [place] Prevention in the Home Falls can cause injuries. They can happen to people of all ages. There are many things you can do to make your home safe and to help prevent falls. What can I do on the outside of my home? Regularly fix the edges of walkways and driveways and fix any cracks. Remove anything that might make you trip as you walk through a door, such as a raised step or threshold. Trim any bushes or trees on the path to your home. Use bright outdoor lighting. Clear any walking paths of anything that might make someone trip, such as rocks or tools. Regularly check to see if handrails are loose or broken. Make sure that both sides of any steps have handrails. Any raised decks and porches should have guardrails on the edges. Have any leaves, snow, or ice cleared regularly. Use sand or salt on walking paths during winter. Clean up any spills in your garage right away. This includes oil or grease spills. What can I do in the bathroom? Use night lights. Install grab bars by the toilet and in the tub and shower. Do not use towel bars as grab bars. Use non-skid mats or decals in the tub or shower. If you need to sit down in the shower, use a plastic, non-slip stool. Keep the floor dry. Clean up any water that spills on the floor as soon as it happens. Remove soap buildup in the tub or shower regularly. Attach bath mats securely with double-sided non-slip rug tape. Do not  have throw rugs and other things on the floor that can make you trip. What can I do in the bedroom? Use night lights. Make sure that you have a light by your bed that is easy to reach. Do not use any sheets or blankets that are too big for your bed. They should not hang down onto the floor. Have a firm chair that has side arms. You can use this for support while you get dressed. Do not have throw rugs and other things on the floor that can make you trip. What can I do in the kitchen? Clean up any spills right away. Avoid walking on wet floors. Keep items that you use a lot in easy-to-reach places. If you need to reach something above you, use a strong step stool that has a grab bar. Keep electrical cords out of the way. Do not use floor polish or wax that makes floors slippery. If you must use wax, use non-skid floor wax. Do not have throw rugs and other things on the floor that can make you trip. What can I do with my stairs? Do not leave any items on the stairs. Make sure that there are handrails on both sides of the stairs and use them. Fix handrails that are broken or loose. Make sure that handrails are as long as the stairways. Check any carpeting to make sure that it  is firmly attached to the stairs. Fix any carpet that is loose or worn. Avoid having throw rugs at the top or bottom of the stairs. If you do have throw rugs, attach them to the floor with carpet tape. Make sure that you have a light switch at the top of the stairs and the bottom of the stairs. If you do not have them, ask someone to add them for you. What else can I do to help prevent falls? Wear shoes that: Do not have high heels. Have rubber bottoms. Are comfortable and fit you well. Are closed at the toe. Do not wear sandals. If you use a stepladder: Make sure that it is fully opened. Do not climb a closed stepladder. Make sure that both sides of the stepladder are locked into place. Ask someone to hold it for  you, if possible. Clearly mark and make sure that you can see: Any grab bars or handrails. First and last steps. Where the edge of each step is. Use tools that help you move around (mobility aids) if they are needed. These include: Canes. Walkers. Scooters. Crutches. Turn on the lights when you go into a dark area. Replace any light bulbs as soon as they burn out. Set up your furniture so you have a clear path. Avoid moving your furniture around. If any of your floors are uneven, fix them. If there are any pets around you, be aware of where they are. Review your medicines with your doctor. Some medicines can make you feel dizzy. This can increase your chance of falling. Ask your doctor what other things that you can do to help prevent falls. This information is not intended to replace advice given to you by your health care provider. Make sure you discuss any questions you have with your health care provider. Document Released: 09/20/2009 Document Revised: 05/01/2016 Document Reviewed: 12/29/2014 Elsevier Interactive Patient Education  2017 [employer].

## 2021-06-20 ENCOUNTER — Other Ambulatory Visit: Payer: Self-pay | Admitting: Family

## 2021-06-20 DIAGNOSIS — Z1231 Encounter for screening mammogram for malignant neoplasm of breast: Secondary | ICD-10-CM

## 2021-06-21 ENCOUNTER — Other Ambulatory Visit: Payer: Self-pay | Admitting: *Deleted

## 2021-06-21 MED ORDER — SIMVASTATIN 40 MG PO TABS: ORAL_TABLET | ORAL | 1 refills | Status: DC

## 2021-06-21 MED ORDER — OXYBUTYNIN CHLORIDE 5 MG PO TABS: ORAL_TABLET | ORAL | 1 refills | Status: DC

## 2021-06-21 MED ORDER — HYDRALAZINE HCL 25 MG PO TABS: ORAL_TABLET | ORAL | 1 refills | Status: DC

## 2021-06-21 MED ORDER — AMLODIPINE BESYLATE 10 MG PO TABS: 10.0000 mg | ORAL_TABLET | Freq: Every day | ORAL | 1 refills | Status: DC

## 2021-06-21 NOTE — Telephone Encounter (Signed)
[facility]

## 2021-06-21 NOTE — Addendum Note (Signed)
Addended by: Rafael Bihari A on: 06/21/2021 05:00 PM   Modules accepted: Orders

## 2021-07-28 ENCOUNTER — Telehealth: Payer: Medicare HMO | Admitting: Physician Assistant

## 2021-07-28 DIAGNOSIS — U071 COVID-19: Secondary | ICD-10-CM | POA: Diagnosis not present

## 2021-07-28 MED ORDER — MOLNUPIRAVIR EUA 200MG CAPSULE: 4.0000 | ORAL_CAPSULE | Freq: Two times a day (BID) | ORAL | 0 refills | Status: AC

## 2021-07-28 MED ORDER — BENZONATATE 100 MG PO CAPS: 100.0000 mg | ORAL_CAPSULE | Freq: Three times a day (TID) | ORAL | 0 refills | Status: DC | PRN

## 2021-07-28 NOTE — Progress Notes (Signed)
Virtual Visit Consent   [name], you are scheduled for a virtual visit with a [facility] provider today.     Just as with appointments in the office, your consent must be obtained to participate.  Your consent will be active for this visit and any virtual visit you may have with one of our providers in the next 365 days.     If you have a MyChart account, a copy of this consent can be sent to you electronically.  All virtual visits are billed to your insurance company just like a traditional visit in the office.    As this is a virtual visit, video technology does not allow for your provider to perform a traditional examination.  This may limit your provider's ability to fully assess your condition.  If your provider identifies any concerns that need to be evaluated in person or the need to arrange testing (such as labs, EKG, etc.), we will make arrangements to do so.     Although advances in technology are sophisticated, we cannot ensure that it will always work on either your end or our end.  If the connection with a video visit is poor, the visit may have to be switched to a telephone visit.  With either a video or telephone visit, we are not always able to ensure that we have a secure connection.     I need to obtain your verbal consent now.   Are you willing to proceed with your visit today?    [name] has provided verbal consent on 07/28/2021 for a virtual visit (video or telephone).   Mar Daring, PA-C   Date: 07/28/2021 12:50 PM   Virtual Visit via Video Note   IMar Daring, connected with  [name]  ([id number], [birth year]) on 07/28/21 at 12:45 PM EDT by a video-enabled telemedicine application and verified that I am speaking with the correct person using two identifiers.  Location: Patient: Virtual Visit Location Patient: Home with her daughter, [name], helping answer questions due to patient with dementia Provider: Virtual Visit Location  Provider: Home Office   I discussed the limitations of evaluation and management by telemedicine and the availability of in person appointments. The patient expressed understanding and agreed to proceed.    History of Present Illness: [name] is a 75 y.o. who identifies as a female who was assigned female at birth, and is being seen today for Covid 77.  HPI: URI  This is a new problem. The current episode started yesterday (tested positive for covid 19 today, symptoms started yesterday). The problem has been gradually worsening. There has been no fever. Associated symptoms include congestion, coughing, diarrhea (one bout yesterday) and rhinorrhea. Pertinent negatives include no ear pain, headaches, nausea, plugged ear sensation, sinus pain, sneezing, sore throat or vomiting. Associated symptoms comments: weakness. She has tried acetaminophen for the symptoms. The treatment provided no relief.     Problems:  Patient Active Problem List   Diagnosis Date Noted   BMI 40.0-44.9, adult ([place]) 06/27/2019   Urge urinary incontinence 03/03/2016   Hyperglycemia 03/03/2016   Vascular dementia, uncomplicated (HCC)    Hyperlipidemia    Chronic kidney disease    Hypertension     Allergies: No Known Allergies Medications:  Current Outpatient Medications:    benzonatate (TESSALON) 100 MG capsule, Take 1 capsule (100 mg total) by mouth 3 (three) times daily as needed., Disp: 30 capsule, Rfl: 0   molnupiravir EUA  200 mg CAPS, Take 4 capsules (800 mg total) by mouth 2 (two) times daily for 5 days., Disp: 40 capsule, Rfl: 0   amLODipine (NORVASC) 10 MG tablet, Take 1 tablet (10 mg total) by mouth daily. for blood pressure, Disp: 90 tablet, Rfl: 1   aspirin EC 81 MG tablet, Take 81 mg by mouth daily., Disp: , Rfl:    cholecalciferol (VITAMIN D) 1000 units tablet, Take 1,000 Units by mouth daily., Disp: , Rfl:    hydrALAZINE (APRESOLINE) 25 MG tablet, TAKE 1 TABLET THREE TIMES DAILY WITH MEALS TO  CONTROL BLOOD PRESSURE, Disp: 270 tablet, Rfl: 1   losartan (COZAAR) 50 MG tablet, TAKE 1 AND 1/2 TABLETS EVERY DAY, Disp: 135 tablet, Rfl: 1   Multiple Vitamin (MULTIVITAMIN) tablet, Take 1 tablet by mouth daily., Disp: , Rfl:    NAMZARIC 28-10 MG CP24, TAKE 1 CAPSULE  DAILY TO PRESERVE MEMORY, Disp: 90 capsule, Rfl: 1   oxybutynin (DITROPAN) 5 MG tablet, TAKE 1 TABLET ONE TIME DAILY FOR BLADDER CONTROL, Disp: 90 tablet, Rfl: 1   simvastatin (ZOCOR) 40 MG tablet, TAKE 1 TABLET EVERY DAY AT 6PM, Disp: 90 tablet, Rfl: 1  Observations/Objective: Patient is well-developed, well-nourished in no acute distress.  Resting comfortably at home.  Head is normocephalic, atraumatic.  No labored breathing.  Speech is clear and coherent with logical content.  Patient is alert and oriented at baseline.    Assessment and Plan: 1. COVID-19 - molnupiravir EUA 200 mg CAPS; Take 4 capsules (800 mg total) by mouth 2 (two) times daily for 5 days.  Dispense: 40 capsule; Refill: 0 - benzonatate (TESSALON) 100 MG capsule; Take 1 capsule (100 mg total) by mouth 3 (three) times daily as needed.  Dispense: 30 capsule; Refill: 0 - MyChart COVID-19 home monitoring program; Future - Continue OTC symptomatic management of choice - Will send OTC vitamins and supplement information through AVS - Molnupiravir prescribed - Tessalon perles for cough - Patient enrolled in MyChart symptom monitoring - Push fluids - Rest as needed - Discussed return precautions and when to seek in-person evaluation, sent via AVS as well  Follow Up Instructions: I discussed the assessment and treatment plan with the patient. The patient was provided an opportunity to ask questions and all were answered. The patient agreed with the plan and demonstrated an understanding of the instructions.  A copy of instructions were sent to the patient via MyChart.  The patient was advised to call back or seek an in-person evaluation if the symptoms  worsen or if the condition fails to improve as anticipated.  Time:  I spent 12 minutes with the patient via telehealth technology discussing the above problems/concerns.    Mar Daring, PA-C

## 2021-07-28 NOTE — Patient Instructions (Signed)
[place] are being placed in the home monitoring program for COVID-19 (commonly known as Coronavirus).  This is because you are suspected to have the virus or are known to have the virus.  If you are unsure which group you fall into call your clinic.    As part of this program, you'll answer a daily questionnaire in the MyChart mobile app. You'll receive a notification through the MyChart app when the questionnaire is available. When you log in to MyChart, you'll see the tasks in your To Do activity.       Clinicians will see any answers that are concerning and take appropriate steps.  If at any point you are having a medical emergency, call 911.  If otherwise concerned call your clinic instead of coming into the clinic or hospital.  To keep from spreading the disease you should: Stay home and limit contact with other people as much as possible.  Wash your hands frequently. Cover your coughs and sneezes with a tissue, and throw used tissues in the trash.   Clean and disinfect frequently touched surfaces and objects.    Take care of yourself by: Staying home Resting Drinking fluids Take fever-reducing medications (Tylenol/Acetaminophen and Ibuprofen)  For more information on the disease go to the Centers for Disease Control and Prevention website     COVID-19: What to Do if You Are Sick CDC has updated isolation and quarantine recommendations for the public, and is revising the CDC website to reflect these changes. These recommendations do not apply to healthcare personnel and do not supersede state, local, tribal, or territorial laws, rules, andregulations. If you have a fever, cough or other symptoms, you might have COVID-19. Most people have mild illness and are able to recover at home. If you are sick: Keep track of your symptoms. If you have an emergency warning sign (including trouble breathing), call 911. Steps to help prevent the spread of COVID-19 if you are sick If you  are sick with COVID-19 or think you might have COVID-19, follow the steps below to care for yourself and to help protect other peoplein your home and community. Stay home except to get medical care Stay home. Most people with COVID-19 have mild illness and can recover at home without medical care. Do not leave your home, except to get medical care. Do not visit public areas. Take care of yourself. Get rest and stay hydrated. Take over-the-counter medicines, such as acetaminophen, to help you feel better. Stay in touch with your doctor. Call before you get medical care. Be sure to get care if you have trouble breathing, or have any other emergency warning signs, or if you think it is an emergency. Avoid public transportation, ride-sharing, or taxis. Separate yourself from other people As much as possible, stay in a specific room and away from other people and pets in your home. If possible, you should use a separate bathroom. If you need to be around other people or animals in oroutside of the home, wear a mask. Tell your close contactsthat they may have been exposed to COVID-19. An infected person can spread COVID-19 starting 48 hours (or 2 days) before the person has any symptoms or tests positive. By letting your close contacts know they may have been exposed to COVID-19, you are helping to protect everyone. Additional guidance is available for those living in close quarters and shared housing. See COVID-19 and Animals if you have questions about pets. If you are diagnosed with  COVID-19, someone from the health department may call you. Answer the call to slow the spread. Monitor your symptoms Symptoms of COVID-19 include fever, cough, or other symptoms. Follow care instructions from your healthcare provider and local health department. Your local health authorities may give instructions on checking your symptoms and reporting information. When to seek emergency medical attention Look for emergency  warning signs* for COVID-19. If someone is showing any of these signs, seek emergency medical care immediately: Trouble breathing Persistent pain or pressure in the chest New confusion Inability to wake or stay awake Pale, gray, or blue-colored skin, lips, or nail beds, depending on skin tone *This list is not all possible symptoms. Please call your medical provider forany other symptoms that are severe or concerning to you. Call 911 or call ahead to your local emergency facility: Notify the operator that you are seeking care for someone who has or may haveCOVID-19. Call ahead before visiting your doctor Call ahead. Many medical visits for routine care are being postponed or done by phone or telemedicine. If you have a medical appointment that cannot be postponed, call your doctor's office, and tell them you have or may have COVID-19. This will help the office protect themselves and other patients. Get tested If you have symptoms of COVID-19, get tested. While waiting for test results, you stay away from others, including staying apart from those living in your household. Self-tests are one of several options for testing for the virus that causes COVID-19 and may be more convenient than laboratory-based tests and point-of-care tests. Ask your healthcare provider or your local health department if you need help interpreting your test results. You can visit your state, tribal, local, and territorial health department's website to look for the latest local information on testing sites. If you are sick, wear a mask over your nose and mouth You should wear a mask over your nose and mouth if you must be around other people or animals, including pets (even at home). You don't need to wear the mask if you are alone. If you can't put on a mask (because of trouble breathing, for example), cover your coughs and sneezes in some other way. Try to stay at least 6 feet away from other people. This will help  protect the people around you. Masks should not be placed on young children under age 63 years, anyone who has trouble breathing, or anyone who is not able to remove the mask without help. Note: During the COVID-19 pandemic, medical grade facemasks are reserved forhealthcare workers and some first responders. Cover your coughs and sneezes Cover your mouth and nose with a tissue when you cough or sneeze. Throw away used tissues in a lined trash can. Immediately wash your hands with soap and water for at least 20 seconds. If soap and water are not available, clean your hands with an alcohol-based hand sanitizer that contains at least 60% alcohol. Clean your hands often Wash your hands often with soap and water for at least 20 seconds. This is especially important after blowing your nose, coughing, or sneezing; going to the bathroom; and before eating or preparing food. Use hand sanitizer if soap and water are not available. Use an alcohol-based hand sanitizer with at least 60% alcohol, covering all surfaces of your hands and rubbing them together until they feel dry. Soap and water are the best option, especially if hands are visibly dirty. Avoid touching your eyes, nose, and mouth with unwashed hands. Handwashing Tips Avoid sharing  personal household items Do not share dishes, drinking glasses, cups, eating utensils, towels, or bedding with other people in your home. Wash these items thoroughly after using them with soap and water or put in the dishwasher. Clean all "high-touch" surfaces every day Clean and disinfect high-touch surfaces in your "sick room" and bathroom; wear disposable gloves. Let someone else clean and disinfect surfaces in common areas, but you should clean your bedroom and bathroom, if possible. If a caregiver or other person needs to clean and disinfect a sick person's bedroom or bathroom, they should do so on an as-needed basis. The caregiver/other person should wear a mask and  disposable gloves prior to cleaning. They should wait as long as possible after the person who is sick has used the bathroom before coming in to clean and use the bathroom. High-touch surfaces include phones, remote controls, counters, tabletops, doorknobs, bathroom fixtures, toilets, keyboards, tablets, and bedside tables. Clean and disinfect areas that may have blood, stool, or body fluids on them. Use household cleaners and disinfectants. Clean the area or item with soap and water or another detergent if it is dirty. Then, use a household disinfectant. Be sure to follow the instructions on the label to ensure safe and effective use of the product. Many products recommend keeping the surface wet for several minutes to ensure germs are killed. Many also recommend precautions such as wearing gloves and making sure you have good ventilation during use of the product. Use a product from [employer]: Disinfectants for Coronavirus ([id number]). Complete Disinfection Guidance When you can be around others after being sick with COVID-19 Deciding when you can be around others is different for different situations. Find out when you can safely end home isolation. For any additional questions about your care,contact your healthcare provider or state or local health department. 11/14/2020 Content source: [facility] ([facility]), Division of Viral Diseases This information is not intended to replace advice given to you by your health care provider. Make sure you discuss any questions you have with your healthcare provider. Document Revised: 01/11/2021 Document Reviewed: 01/11/2021 Elsevier Patient Education  2022 [place] are being prescribed MOLNUPIRAVIR for COVID-19 infection.   Please call the pharmacy or go through the drive through vs going inside if you are picking up the mediation yourself to prevent further spread. If prescribed to a [facility]  affiliated pharmacy, a pharmacist will bring the medication out to your car.   ADMINISTRATION INSTRUCTIONS: Take with or without food. Swallow the tablets whole. Don't chew, crush, or break the medications because it might not work as well  For each dose of the medication, you should be taking FOUR tablets at one time, TWICE a day   Finish your full five-day course of Molnupiravir even if you feel better before you're done. Stopping this medication too early can make it less effective to prevent severe illness related to [place].    Molnupiravir is prescribed for YOU ONLY. Don't share it with others, even if they have similar symptoms as you. This medication might not be right for everyone.   Make sure to take steps to protect yourself and others while you're taking this medication in order to get well soon and to prevent others from getting sick with COVID-19.   **If you are of childbearing potential (any gender) - it is advised to not get pregnant while taking this medication and recommended that condoms are used for female partners the  next 3 months after taking the medication out of extreme caution    COMMON SIDE EFFECTS: Diarrhea Nausea  Dizziness    If your COVID-19 symptoms get worse, get medical help right away. Call 911 if you experience symptoms such as worsening cough, trouble breathing, chest pain that doesn't go away, confusion, a hard time staying awake, and pale or blue-colored skin. This medication won't prevent all COVID-19 cases from getting worse.   Can take to lessen severity: Vit C '500mg'$  twice daily Quercertin 250-'500mg'$  twice daily Zinc 75-'100mg'$  daily Melatonin 3-6 mg at bedtime Vit D3 1000-2000 IU daily Aspirin 81 mg daily with food Optional: Famotidine '20mg'$  daily Also can add tylenol/ibuprofen as needed for fevers and body aches May add Mucinex or Mucinex DM as needed for cough/congestion  10 Things You Can Do to Manage Your COVID-19 Symptoms at Home If  you have possible or confirmed COVID-19 Stay home except to get medical care. Monitor your symptoms carefully. If your symptoms get worse, call your healthcare provider immediately. Get rest and stay hydrated. If you have a medical appointment, call the healthcare provider ahead of time and tell them that you have or may have COVID-19. For medical emergencies, call 911 and notify the dispatch personnel that you have or may have COVID-19. Cover your cough and sneezes with a tissue or use the inside of your elbow. Wash your hands often with soap and water for at least 20 seconds or clean your hands with an alcohol-based hand sanitizer that contains at least 60% alcohol. As much as possible, stay in a specific room and away from other people in your home. Also, you should use a separate bathroom, if available. If you need to be around other people in or outside of the home, wear a mask. Avoid sharing personal items with other people in your household, like dishes, towels, and bedding. Clean all surfaces that are touched often, like counters, tabletops, and doorknobs. Use household cleaning sprays or wipes according to the label instructions. June 06/22/2020 This information is not intended to replace advice given to you by your health care provider. Make sure you discuss any questions you have with your healthcare provider. Document Revised: 01/11/2021 Document Reviewed: 01/11/2021 Elsevier Patient Education  [place].

## 2021-09-06 ENCOUNTER — Encounter: Payer: Self-pay | Admitting: Family

## 2021-09-06 ENCOUNTER — Other Ambulatory Visit: Payer: Self-pay

## 2021-09-06 ENCOUNTER — Ambulatory Visit (INDEPENDENT_AMBULATORY_CARE_PROVIDER_SITE_OTHER): Payer: Medicare HMO | Admitting: Family

## 2021-09-06 VITALS — BP 130/86 | HR 87 | Temp 96.8°F | Resp 18 | Ht 65.0 in | Wt 247.2 lb

## 2021-09-06 DIAGNOSIS — E7800 Pure hypercholesterolemia, unspecified: Secondary | ICD-10-CM

## 2021-09-06 DIAGNOSIS — Z6841 Body Mass Index (BMI) 40.0 and over, adult: Secondary | ICD-10-CM | POA: Diagnosis not present

## 2021-09-06 DIAGNOSIS — N183 Chronic kidney disease, stage 3 unspecified: Secondary | ICD-10-CM | POA: Diagnosis not present

## 2021-09-06 DIAGNOSIS — I6990 Unspecified sequelae of unspecified cerebrovascular disease: Secondary | ICD-10-CM | POA: Diagnosis not present

## 2021-09-06 DIAGNOSIS — I129 Hypertensive chronic kidney disease with stage 1 through stage 4 chronic kidney disease, or unspecified chronic kidney disease: Secondary | ICD-10-CM

## 2021-09-06 DIAGNOSIS — N1832 Chronic kidney disease, stage 3b: Secondary | ICD-10-CM

## 2021-09-06 DIAGNOSIS — Z23 Encounter for immunization: Secondary | ICD-10-CM

## 2021-09-06 DIAGNOSIS — F0150 Vascular dementia without behavioral disturbance: Secondary | ICD-10-CM | POA: Diagnosis not present

## 2021-09-06 MED ORDER — TETANUS-DIPHTH-ACELL PERTUSSIS 5-2.5-18.5 LF-MCG/0.5 IM SUSP: 0.5000 mL | Freq: Once | INTRAMUSCULAR | 0 refills | Status: AC

## 2021-09-06 NOTE — Patient Instructions (Signed)

## 2021-09-06 NOTE — Progress Notes (Signed)
Provider: Marlowe Sax FNP-C   Maricela Schreur, Nelda Bucks, NP  Patient Care Team: Ruben Pyka, Nelda Bucks, NP as PCP - General (Family Medicine) [name], MD as Consulting Physician (Ophthalmology)  Extended Emergency Contact Information Primary Emergency Contact: Baria,[name] Address: [address] [place] [address] [name] of [place] Phone: [phone] Mobile Phone: [phone] Relation: Daughter  Code Status:  Full Code  Goals of care: Advanced Directive information Advanced Directives 09/06/2021  Does Patient Have a Medical Advance Directive? No  Type of Advance Directive -  Does patient want to make changes to medical advance directive? -  Copy of [place] in Chart? -  Would patient like information on creating a medical advance directive? No - Patient declined  Pre-existing out of facility DNR order (yellow form or pink MOST form) -     Chief Complaint  Patient presents with   Medical Management of Chronic Issues    6 month follow up.   Immunizations    Discuss the need for Shingrix vaccine, Tetanus vaccine, Covid Booster, and Influenza vaccine.     HPI:  Pt is a 75 y.o. female seen today for 6 months follow up for medical management of chronic diseases.she is here with her daughter.she denies any acute issues.   Has a medical history of  Essential Hypertension,Hyperlipidemia,Chronic Kidney disease stage 3 b,hx of stroke with right side hemiplegia,Vascular dementia without behavioral disturbance, Osteopenia,urge incontinence ,vitamin D deficiency ,Morbid obesity among other conditions.  She is due for Tdap ,shingrix ,influenza and COVID-19 booster vaccine.she was advised to get her Tdap and shingrix vaccine at her pharmacy but daughter states got the COVID-19 vaccine but not the rest.  Agrees to get influenza vaccine here today.No S/Sx of URI. Has been trying to eat healthy except likes to eat ice cream.Weight remains stable  from 6 months ago.  No home B/P log but daughter states checks every once in a while has been stable sometimes SBP in the 140's.She denies any headache,dizziness,vision changes,fatigue,chest tightness,palpitation,chest pain or shortness of breath.       Past Medical History:  Diagnosis Date   Cataract    slow growing    Chronic kidney disease    Hyperlipidemia    Hypertension    Insomnia, unspecified    Memory loss    mild dementia per daughter    Osteopenia    Other abnormal blood chemistry    Other specified cardiac dysrhythmias(427.89)    Other specified disease of nail    Stroke ([place])    2006 or 2007 per daughter    Syncope and collapse    Unspecified disorder of kidney and ureter    Unspecified late effects of cerebrovascular disease    Unspecified transient cerebral ischemia    Unspecified urinary incontinence    Unspecified vitamin D deficiency    Vascular dementia, uncomplicated ([place])    Past Surgical History:  Procedure Laterality Date   NO PAST SURGERIES      No Known Allergies  Allergies as of 09/06/2021   No Known Allergies      Medication List        Accurate as of September 06, 2021 11:14 AM. If you have any questions, ask your nurse or doctor.          STOP taking these medications    benzonatate 100 MG capsule Commonly known as: TESSALON Stopped by: Sandrea Hughs, NP  TAKE these medications    amLODipine 10 MG tablet Commonly known as: NORVASC Take 1 tablet (10 mg total) by mouth daily. for blood pressure   aspirin EC 81 MG tablet Take 81 mg by mouth daily.   cholecalciferol 1000 units tablet Commonly known as: VITAMIN D Take 1,000 Units by mouth daily.   hydrALAZINE 25 MG tablet Commonly known as: APRESOLINE TAKE 1 TABLET THREE TIMES DAILY WITH MEALS TO CONTROL BLOOD PRESSURE   losartan 50 MG tablet Commonly known as: COZAAR TAKE 1 AND 1/2 TABLETS EVERY DAY   multivitamin tablet Take 1 tablet by mouth daily.    Namzaric 28-10 MG Cp24 Generic drug: Memantine HCl-Donepezil HCl TAKE 1 CAPSULE  DAILY TO PRESERVE MEMORY   oxybutynin 5 MG tablet Commonly known as: DITROPAN TAKE 1 TABLET ONE TIME DAILY FOR BLADDER CONTROL   simvastatin 40 MG tablet Commonly known as: ZOCOR TAKE 1 TABLET EVERY DAY AT 6PM        Review of Systems  Constitutional:  Negative for appetite change, chills, fatigue, fever and unexpected weight change.  HENT:  Negative for congestion, dental problem, ear discharge, ear pain, facial swelling, hearing loss, nosebleeds, postnasal drip, rhinorrhea, sinus pressure, sinus pain, sneezing, sore throat, tinnitus and trouble swallowing.   Eyes:  Negative for pain, discharge, redness, itching and visual disturbance.  Respiratory:  Negative for cough, chest tightness, shortness of breath and wheezing.   Cardiovascular:  Negative for chest pain, palpitations and leg swelling.  Gastrointestinal:  Negative for abdominal distention, abdominal pain, blood in stool, constipation, diarrhea, nausea and vomiting.  Endocrine: Negative for cold intolerance, heat intolerance, polydipsia, polyphagia and polyuria.  Genitourinary:  Negative for difficulty urinating, dysuria, flank pain, frequency and urgency.  Musculoskeletal:  Positive for gait problem. Negative for arthralgias, back pain, joint swelling, myalgias, neck pain and neck stiffness.  Skin:  Negative for color change, pallor, rash and wound.  Neurological:  Positive for weakness. Negative for dizziness, syncope, speech difficulty, light-headedness, numbness and headaches.       Right side weakness late effect of stroke   Hematological:  Does not bruise/bleed easily.  Psychiatric/Behavioral:  Negative for agitation, behavioral problems, confusion, hallucinations, self-injury, sleep disturbance and suicidal ideas. The patient is not nervous/anxious.    Immunization History  Administered Date(s) Administered   Fluad Quad(high Dose 65+)  11/22/2020   Influenza, High Dose Seasonal PF 12/25/2017, 08/19/2018   Influenza,inj,Quad PF,6+ Mos 10/17/2013, 12/07/2015, 09/01/2016   Influenza-Unspecified 08/25/2008, 09/12/2011, 09/27/2012   PFIZER(Purple Top)SARS-COV-2 Vaccination 12/27/2019, 01/17/2020   Pneumococcal Conjugate-13 08/04/2014   Pneumococcal-Unspecified 05/07/2011   Tdap 05/07/2011   Pertinent  Health Maintenance Due  Topic Date Due   INFLUENZA VACCINE  07/08/2021   DEXA SCAN  Completed   Fall Risk  09/06/2021 05/28/2021 02/28/2021 11/22/2020 07/16/2020  Falls in the past year? 0 1 0 1 0  Number falls in past yr: 0 0 0 0 0  Injury with Fall? 0 0 0 0 0  Risk Factor Category  - - - - -  Risk for fall due to : No Fall Risks No Fall Risks - - -  Follow up Falls evaluation completed - - - -   Functional Status Survey:    Vitals:   09/06/21 1101  BP: 130/86  Pulse: 87  Resp: 18  Temp: (!) 96.8 F (36 C)  SpO2: 97%  Weight: 247 lb 3.2 oz (112.1 kg)  Height: '5\' 5"'  (1.651 m)   Body mass index is 41.14 kg/m. Physical  Exam Vitals reviewed.  Constitutional:      General: She is not in acute distress.    Appearance: Normal appearance. She is obese. She is not ill-appearing or diaphoretic.  HENT:     Head: Normocephalic.     Right Ear: Tympanic membrane, ear canal and external ear normal. There is no impacted cerumen.     Left Ear: Tympanic membrane, ear canal and external ear normal. There is no impacted cerumen.     Nose: Nose normal. No congestion or rhinorrhea.     Mouth/Throat:     Mouth: Mucous membranes are moist.     Pharynx: Oropharynx is clear. No oropharyngeal exudate or posterior oropharyngeal erythema.  Eyes:     General: No scleral icterus.       Right eye: No discharge.        Left eye: No discharge.     Extraocular Movements: Extraocular movements intact.     Conjunctiva/sclera: Conjunctivae normal.     Pupils: Pupils are equal, round, and reactive to light.  Neck:     Vascular: No carotid  bruit.  Cardiovascular:     Rate and Rhythm: Normal rate and regular rhythm.     Pulses: Normal pulses.     Heart sounds: Normal heart sounds. No murmur heard.   No friction rub. No gallop.  Pulmonary:     Effort: Pulmonary effort is normal. No respiratory distress.     Breath sounds: Normal breath sounds. No wheezing, rhonchi or rales.  Chest:     Chest wall: No tenderness.  Abdominal:     General: Bowel sounds are normal. There is no distension.     Palpations: Abdomen is soft. There is no mass.     Tenderness: There is no abdominal tenderness. There is no right CVA tenderness, left CVA tenderness, guarding or rebound.  Musculoskeletal:        General: No swelling or tenderness. Normal range of motion.     Cervical back: Normal range of motion. No rigidity or tenderness.     Right lower leg: No edema.     Left lower leg: No edema.     Comments: Unsteady gait walks with hand held by daughter   Lymphadenopathy:     Cervical: No cervical adenopathy.  Skin:    General: Skin is warm and dry.     Coloration: Skin is not pale.     Findings: No bruising, erythema, lesion or rash.  Neurological:     Mental Status: She is alert and oriented to person, place, and time.     Cranial Nerves: No cranial nerve deficit.     Sensory: No sensory deficit.     Coordination: Coordination normal.     Gait: Gait abnormal.     Comments: Right side hemiplegia   Psychiatric:        Mood and Affect: Mood normal.        Speech: Speech normal.        Behavior: Behavior normal.        Thought Content: Thought content normal.        Judgment: Judgment normal.    Labs reviewed: Recent Labs    05/02/21 0902  NA 142  K 4.2  CL 107  CO2 24  GLUCOSE 106*  BUN 18  CREATININE 1.35*  CALCIUM 9.7   Recent Labs    05/02/21 0902  AST 13  ALT 12  BILITOT 0.5  PROT 7.4   Recent Labs  05/02/21 0902  WBC 9.3  NEUTROABS 3,897  HGB 14.1  HCT 44.9  MCV 86.5  PLT 279   Lab Results   Component Value Date   TSH 1.43 05/02/2021   Lab Results  Component Value Date   HGBA1C 5.4 05/02/2021   Lab Results  Component Value Date   CHOL 177 05/02/2021   HDL 70 05/02/2021   LDLCALC 93 05/02/2021   TRIG 57 05/02/2021   CHOLHDL 2.5 05/02/2021    Significant Diagnostic Results in last 30 days:  No results found.  Assessment/Plan 1. Need for influenza vaccination Afebrile. Flut shot administered by CMA no acute reaction reported.  - Flu Vaccine QUAD High Dose(Fluad)  2. Benign hypertension with CKD (chronic kidney disease) stage III (HCC) B/p at goal today. Continue on losartan,Hydralazine and Amlodipine  On ASA and simvastatin  - CBC with Differential/Platelet; Future - CMP with eGFR([facility]); Future - TSH; Future  3. Pure hypercholesterolemia LDL at goal  Continue on simvastatin  - Dietary modification and exercise at least 3 times per week for 30 minutes advised.  - additional DASH Eating plan Education information provided on AVS  - Lipid panel; Future  4. Need for Tdap vaccination Advised to get Tdap vaccine at the pharmacy. Script send to pharmacy today - Tdap ([place]) 5-2.5-18.5 LF-MCG/0.5 injection; Inject 0.5 mLs into the muscle once for 1 dose.  Dispense: 0.5 mL; Refill: 0  5. Morbid obesity with BMI of 40.0-44.9, adult (HCC) - Dietary modification and exercise at least 3 times per week for 30 minutes advised.   6. Late effects of cerebrovascular disease Continue on ASA and Statin   7. Vascular dementia without behavioral disturbance ([place]) No new behavioral issues reported. Continue with supportive care   8. Stage 3b chronic kidney disease (HCC) CR 1.35 at baseline  Will continue to avoid Nephrotoxins and dose all other medication for renal clearance   9. BMI 40.0-44.9, adult (HCC) Dietary modification as above  Family/ staff Communication: Reviewed plan of care with patient and daughter verbalized understanding  Labs/tests ordered:   - CBC with Differential/Platelet - CMP with eGFR([facility]) - TSH - Lipid panel  Next Appointment : 6 months for medical management of chronic issues.Fasting Labs prior to visit.   Sandrea Hughs, NP

## 2021-10-02 DIAGNOSIS — H2513 Age-related nuclear cataract, bilateral: Secondary | ICD-10-CM | POA: Diagnosis not present

## 2021-10-02 DIAGNOSIS — H2589 Other age-related cataract: Secondary | ICD-10-CM | POA: Diagnosis not present

## 2021-10-02 DIAGNOSIS — H524 Presbyopia: Secondary | ICD-10-CM | POA: Diagnosis not present

## 2021-10-02 DIAGNOSIS — H353131 Nonexudative age-related macular degeneration, bilateral, early dry stage: Secondary | ICD-10-CM | POA: Diagnosis not present

## 2021-10-02 DIAGNOSIS — H25013 Cortical age-related cataract, bilateral: Secondary | ICD-10-CM | POA: Diagnosis not present

## 2021-10-02 DIAGNOSIS — H52213 Irregular astigmatism, bilateral: Secondary | ICD-10-CM | POA: Diagnosis not present

## 2021-10-02 DIAGNOSIS — H2511 Age-related nuclear cataract, right eye: Secondary | ICD-10-CM | POA: Diagnosis not present

## 2021-10-02 DIAGNOSIS — H401231 Low-tension glaucoma, bilateral, mild stage: Secondary | ICD-10-CM | POA: Diagnosis not present

## 2021-11-04 ENCOUNTER — Other Ambulatory Visit: Payer: Self-pay | Admitting: Family

## 2021-11-04 DIAGNOSIS — I10 Essential (primary) hypertension: Secondary | ICD-10-CM

## 2021-11-05 DIAGNOSIS — H25811 Combined forms of age-related cataract, right eye: Secondary | ICD-10-CM | POA: Diagnosis not present

## 2021-11-05 DIAGNOSIS — H2511 Age-related nuclear cataract, right eye: Secondary | ICD-10-CM | POA: Diagnosis not present

## 2021-11-07 HISTORY — PX: CATARACT EXTRACTION: SUR2

## 2021-11-08 ENCOUNTER — Encounter (HOSPITAL_COMMUNITY): Payer: Self-pay | Admitting: Emergency Medicine

## 2021-11-08 ENCOUNTER — Encounter: Payer: Self-pay | Admitting: Orthopedic Surgery

## 2021-11-08 ENCOUNTER — Other Ambulatory Visit: Payer: Self-pay

## 2021-11-08 ENCOUNTER — Ambulatory Visit (INDEPENDENT_AMBULATORY_CARE_PROVIDER_SITE_OTHER): Payer: Medicare HMO | Admitting: Orthopedic Surgery

## 2021-11-08 ENCOUNTER — Emergency Department (HOSPITAL_COMMUNITY): Payer: Medicare HMO

## 2021-11-08 ENCOUNTER — Inpatient Hospital Stay (HOSPITAL_COMMUNITY)
Admission: EM | Admit: 2021-11-08 | Discharge: 2021-11-11 | DRG: 291 | Disposition: A | Discharge status: Home Health | Payer: Medicare HMO | Attending: Internal Medicine | Admitting: Internal Medicine

## 2021-11-08 VITALS — BP 130/80 | HR 135 | Temp 97.3°F | Ht 65.0 in | Wt 261.8 lb

## 2021-11-08 DIAGNOSIS — I509 Heart failure, unspecified: Secondary | ICD-10-CM | POA: Diagnosis not present

## 2021-11-08 DIAGNOSIS — Z6841 Body Mass Index (BMI) 40.0 and over, adult: Secondary | ICD-10-CM | POA: Diagnosis not present

## 2021-11-08 DIAGNOSIS — R9431 Abnormal electrocardiogram [ECG] [EKG]: Secondary | ICD-10-CM

## 2021-11-08 DIAGNOSIS — Z66 Do not resuscitate: Secondary | ICD-10-CM | POA: Diagnosis not present

## 2021-11-08 DIAGNOSIS — R0602 Shortness of breath: Secondary | ICD-10-CM

## 2021-11-08 DIAGNOSIS — Z8673 Personal history of transient ischemic attack (TIA), and cerebral infarction without residual deficits: Secondary | ICD-10-CM

## 2021-11-08 DIAGNOSIS — I130 Hypertensive heart and chronic kidney disease with heart failure and stage 1 through stage 4 chronic kidney disease, or unspecified chronic kidney disease: Secondary | ICD-10-CM | POA: Diagnosis not present

## 2021-11-08 DIAGNOSIS — Z841 Family history of disorders of kidney and ureter: Secondary | ICD-10-CM | POA: Diagnosis not present

## 2021-11-08 DIAGNOSIS — I5031 Acute diastolic (congestive) heart failure: Secondary | ICD-10-CM | POA: Diagnosis present

## 2021-11-08 DIAGNOSIS — Z8249 Family history of ischemic heart disease and other diseases of the circulatory system: Secondary | ICD-10-CM | POA: Diagnosis not present

## 2021-11-08 DIAGNOSIS — R32 Unspecified urinary incontinence: Secondary | ICD-10-CM

## 2021-11-08 DIAGNOSIS — M8580 Other specified disorders of bone density and structure, unspecified site: Secondary | ICD-10-CM | POA: Diagnosis present

## 2021-11-08 DIAGNOSIS — Z79899 Other long term (current) drug therapy: Secondary | ICD-10-CM | POA: Diagnosis not present

## 2021-11-08 DIAGNOSIS — I129 Hypertensive chronic kidney disease with stage 1 through stage 4 chronic kidney disease, or unspecified chronic kidney disease: Secondary | ICD-10-CM | POA: Diagnosis not present

## 2021-11-08 DIAGNOSIS — I10 Essential (primary) hypertension: Secondary | ICD-10-CM

## 2021-11-08 DIAGNOSIS — Z20822 Contact with and (suspected) exposure to covid-19: Secondary | ICD-10-CM | POA: Diagnosis not present

## 2021-11-08 DIAGNOSIS — N1832 Chronic kidney disease, stage 3b: Secondary | ICD-10-CM

## 2021-11-08 DIAGNOSIS — I480 Paroxysmal atrial fibrillation: Secondary | ICD-10-CM | POA: Diagnosis not present

## 2021-11-08 DIAGNOSIS — E785 Hyperlipidemia, unspecified: Secondary | ICD-10-CM | POA: Diagnosis present

## 2021-11-08 DIAGNOSIS — F01A0 Vascular dementia, mild, without behavioral disturbance, psychotic disturbance, mood disturbance, and anxiety: Secondary | ICD-10-CM | POA: Diagnosis present

## 2021-11-08 DIAGNOSIS — I4892 Unspecified atrial flutter: Secondary | ICD-10-CM

## 2021-11-08 DIAGNOSIS — Z7982 Long term (current) use of aspirin: Secondary | ICD-10-CM

## 2021-11-08 DIAGNOSIS — Z87891 Personal history of nicotine dependence: Secondary | ICD-10-CM | POA: Diagnosis not present

## 2021-11-08 DIAGNOSIS — N183 Chronic kidney disease, stage 3 unspecified: Secondary | ICD-10-CM | POA: Diagnosis not present

## 2021-11-08 DIAGNOSIS — I110 Hypertensive heart disease with heart failure: Secondary | ICD-10-CM | POA: Diagnosis not present

## 2021-11-08 DIAGNOSIS — F0150 Vascular dementia without behavioral disturbance: Secondary | ICD-10-CM | POA: Diagnosis present

## 2021-11-08 DIAGNOSIS — E7800 Pure hypercholesterolemia, unspecified: Secondary | ICD-10-CM | POA: Diagnosis not present

## 2021-11-08 LAB — URINALYSIS, COMPLETE (UACMP) WITH MICROSCOPIC
Bilirubin Urine: NEGATIVE
Glucose, UA: NEGATIVE mg/dL
Hgb urine dipstick: NEGATIVE
Ketones, ur: NEGATIVE mg/dL
Leukocytes,Ua: NEGATIVE
Nitrite: NEGATIVE
Protein, ur: NEGATIVE mg/dL
Specific Gravity, Urine: 1.006 (ref 1.005–1.030)
pH: 6 (ref 5.0–8.0)

## 2021-11-08 LAB — CBC WITH DIFFERENTIAL/PLATELET
Abs Immature Granulocytes: 0.06 10*3/uL (ref 0.00–0.07)
Basophils Absolute: 0 10*3/uL (ref 0.0–0.1)
Basophils Relative: 0 %
Eosinophils Absolute: 0 10*3/uL (ref 0.0–0.5)
Eosinophils Relative: 0 %
HCT: 45.8 % (ref 36.0–46.0)
Hemoglobin: 14.6 g/dL (ref 12.0–15.0)
Immature Granulocytes: 1 %
Lymphocytes Relative: 13 %
Lymphs Abs: 1.3 10*3/uL (ref 0.7–4.0)
MCH: 28.9 pg (ref 26.0–34.0)
MCHC: 31.9 g/dL (ref 30.0–36.0)
MCV: 90.7 fL (ref 80.0–100.0)
Monocytes Absolute: 0.9 10*3/uL (ref 0.1–1.0)
Monocytes Relative: 9 %
Neutro Abs: 7.6 10*3/uL (ref 1.7–7.7)
Neutrophils Relative %: 77 %
Platelets: 207 10*3/uL (ref 150–400)
RBC: 5.05 MIL/uL (ref 3.87–5.11)
RDW: 14.3 % (ref 11.5–15.5)
WBC: 9.9 10*3/uL (ref 4.0–10.5)
nRBC: 0 % (ref 0.0–0.2)

## 2021-11-08 LAB — COMPREHENSIVE METABOLIC PANEL
ALT: 25 U/L (ref 0–44)
AST: 25 U/L (ref 15–41)
Albumin: 3.9 g/dL (ref 3.5–5.0)
Alkaline Phosphatase: 97 U/L (ref 38–126)
Anion gap: 13 (ref 5–15)
BUN: 19 mg/dL (ref 8–23)
CO2: 21 mmol/L — ABNORMAL LOW (ref 22–32)
Calcium: 9.7 mg/dL (ref 8.9–10.3)
Chloride: 104 mmol/L (ref 98–111)
Creatinine, Ser: 1.4 mg/dL — ABNORMAL HIGH (ref 0.44–1.00)
GFR, Estimated: 39 mL/min — ABNORMAL LOW (ref >60)
Glucose, Bld: 131 mg/dL — ABNORMAL HIGH (ref 70–99)
Potassium: 4.7 mmol/L (ref 3.5–5.1)
Sodium: 138 mmol/L (ref 135–145)
Total Bilirubin: 1.1 mg/dL (ref 0.3–1.2)
Total Protein: 7.9 g/dL (ref 6.5–8.1)

## 2021-11-08 LAB — RAPID URINE DRUG SCREEN, HOSP PERFORMED
Amphetamines: NOT DETECTED
Barbiturates: NOT DETECTED
Benzodiazepines: NOT DETECTED
Cocaine: NOT DETECTED
Opiates: NOT DETECTED
Tetrahydrocannabinol: NOT DETECTED

## 2021-11-08 LAB — TROPONIN I (HIGH SENSITIVITY)
Troponin I (High Sensitivity): 12 ng/L (ref <18)
Troponin I (High Sensitivity): 14 ng/L (ref <18)

## 2021-11-08 LAB — TSH: TSH: 1.419 u[IU]/mL (ref 0.350–4.500)

## 2021-11-08 LAB — RESP PANEL BY RT-PCR (FLU A&B, COVID) ARPGX2
Influenza A by PCR: NEGATIVE
Influenza B by PCR: NEGATIVE
SARS Coronavirus 2 by RT PCR: NEGATIVE

## 2021-11-08 LAB — MAGNESIUM: Magnesium: 2.1 mg/dL (ref 1.7–2.4)

## 2021-11-08 LAB — BRAIN NATRIURETIC PEPTIDE: B Natriuretic Peptide: 408.2 pg/mL — ABNORMAL HIGH (ref 0.0–100.0)

## 2021-11-08 MED ORDER — HYDRALAZINE HCL 25 MG PO TABS
25.0000 mg | ORAL_TABLET | Freq: Three times a day (TID) | ORAL | Status: DC
  Administered 2021-11-09 – 2021-11-11 (×8): 25 mg via ORAL
  Filled 2021-11-08 (×8): qty 1

## 2021-11-08 MED ORDER — FUROSEMIDE 10 MG/ML IJ SOLN
40.0000 mg | Freq: Once | INTRAMUSCULAR | Status: AC
  Administered 2021-11-08: 40 mg via INTRAVENOUS
  Filled 2021-11-08: qty 4

## 2021-11-08 MED ORDER — ACETAMINOPHEN 650 MG RE SUPP: 650.0000 mg | Freq: Four times a day (QID) | RECTAL | Status: DC | PRN

## 2021-11-08 MED ORDER — FUROSEMIDE 10 MG/ML IJ SOLN
40.0000 mg | Freq: Two times a day (BID) | INTRAMUSCULAR | Status: DC
  Administered 2021-11-09 – 2021-11-10 (×3): 40 mg via INTRAVENOUS
  Filled 2021-11-08 (×3): qty 4

## 2021-11-08 MED ORDER — SIMVASTATIN 20 MG PO TABS
40.0000 mg | ORAL_TABLET | Freq: Every day | ORAL | Status: DC
  Administered 2021-11-08 – 2021-11-10 (×3): 40 mg via ORAL
  Filled 2021-11-08 (×4): qty 2

## 2021-11-08 MED ORDER — APIXABAN 5 MG PO TABS
5.0000 mg | ORAL_TABLET | Freq: Two times a day (BID) | ORAL | Status: DC
  Administered 2021-11-09 – 2021-11-11 (×5): 5 mg via ORAL
  Filled 2021-11-08 (×5): qty 1

## 2021-11-08 MED ORDER — ACETAMINOPHEN 325 MG PO TABS: 650.0000 mg | ORAL_TABLET | Freq: Four times a day (QID) | ORAL | Status: DC | PRN

## 2021-11-08 MED ORDER — METOPROLOL TARTRATE 5 MG/5ML IV SOLN: 5.0000 mg | INTRAVENOUS | Status: DC | PRN

## 2021-11-08 MED ORDER — APIXABAN 5 MG PO TABS
5.0000 mg | ORAL_TABLET | Freq: Once | ORAL | Status: AC
  Administered 2021-11-08: 5 mg via ORAL
  Filled 2021-11-08: qty 1

## 2021-11-08 MED ORDER — METOPROLOL TARTRATE 5 MG/5ML IV SOLN
2.5000 mg | Freq: Once | INTRAVENOUS | Status: AC
  Administered 2021-11-08: 2.5 mg via INTRAVENOUS
  Filled 2021-11-08: qty 5

## 2021-11-08 MED ORDER — BRIMONIDINE TARTRATE 0.2 % OP SOLN
1.0000 [drp] | Freq: Three times a day (TID) | OPHTHALMIC | Status: DC
  Administered 2021-11-09 – 2021-11-11 (×6): 1 [drp] via OPHTHALMIC
  Filled 2021-11-08: qty 5

## 2021-11-08 NOTE — Progress Notes (Signed)
Careteam: Patient Care Team: Ngetich, Nelda Bucks, NP as PCP - General (Family Medicine) [name], MD as Consulting Physician (Ophthalmology)  Seen by: Windell Moulding, AGNP-C  PLACE OF SERVICE:  [facility] Directive information    No Known Allergies  Chief Complaint  Patient presents with   Acute Visit    Patient went in for cataract surgery and afib noted on EKG. Patient needs cardiology referral. Patient having wheezing and shortness of breath today that seems to be worse.      HPI: Patient is a 75 y.o. female seen today for acute visit due to recent abnormal EKG.   11/29 she underwent right cataract surgery. EKG at surgical center indicated Atrial fibrillation with RVR. She was advised to see primary for cardiology referral.   EKG in office today indicates atrial aflutter. She reports feeling short of breath and weak. Sats 94% on room air.   Advised to report to ED for further evaluation.   Review of Systems:  Review of Systems  Constitutional:  Negative for chills, fever, malaise/fatigue and weight loss.  HENT: Negative.    Eyes: Negative.   Respiratory:  Positive for shortness of breath. Negative for cough and wheezing.   Cardiovascular:  Positive for palpitations. Negative for chest pain and leg swelling.  Gastrointestinal: Negative.   Genitourinary: Negative.   Musculoskeletal: Negative.   Skin: Negative.   Neurological:  Positive for weakness. Negative for dizziness and headaches.  Psychiatric/Behavioral:  Negative for depression. The patient is not nervous/anxious.    Past Medical History:  Diagnosis Date   Cataract    slow growing    Chronic kidney disease    Hyperlipidemia    Hypertension    Insomnia, unspecified    Memory loss    mild dementia per daughter    Osteopenia    Other abnormal blood chemistry    Other specified cardiac dysrhythmias(427.89)    Other specified disease of nail    Stroke ([place])    2006 or 2007 per daughter     Syncope and collapse    Unspecified disorder of kidney and ureter    Unspecified late effects of cerebrovascular disease    Unspecified transient cerebral ischemia    Unspecified urinary incontinence    Unspecified vitamin D deficiency    Vascular dementia, uncomplicated ([place])    Past Surgical History:  Procedure Laterality Date   NO PAST SURGERIES     Social History:   reports that she has quit smoking. Her smoking use included cigarettes. She has never used smokeless tobacco. She reports that she does not drink alcohol and does not use drugs.  Family History  Problem Relation Age of Onset   Pneumonia Mother    Kidney disease Sister    Hypertension Brother    Colon cancer Neg Hx    Colon polyps Neg Hx    Esophageal cancer Neg Hx    Rectal cancer Neg Hx    Stomach cancer Neg Hx     Medications: Patient's Medications  New Prescriptions   No medications on file  Previous Medications   AMLODIPINE (NORVASC) 10 MG TABLET    Take 1 tablet (10 mg total) by mouth daily. for blood pressure   ASPIRIN EC 81 MG TABLET    Take 81 mg by mouth daily.   BRIMONIDINE (ALPHAGAN) 0.2 % OPHTHALMIC SOLUTION    Place 1 drop into the right eye 4 (four) times daily.   CHOLECALCIFEROL (VITAMIN D) 1000 UNITS TABLET  Take 1,000 Units by mouth daily.   HYDRALAZINE (APRESOLINE) 25 MG TABLET    TAKE 1 TABLET THREE TIMES DAILY WITH MEALS TO CONTROL BLOOD PRESSURE   LOSARTAN (COZAAR) 50 MG TABLET    TAKE 1 AND 1/2 TABLETS EVERY DAY   MULTIPLE VITAMIN (MULTIVITAMIN) TABLET    Take 1 tablet by mouth daily.   NAMZARIC 28-10 MG CP24    TAKE 1 CAPSULE  DAILY TO PRESERVE MEMORY   OXYBUTYNIN (DITROPAN) 5 MG TABLET    TAKE 1 TABLET ONE TIME DAILY FOR BLADDER CONTROL   SIMVASTATIN (ZOCOR) 40 MG TABLET    TAKE 1 TABLET EVERY DAY AT 6PM  Modified Medications   No medications on file  Discontinued Medications   No medications on file    Physical Exam:  There were no vitals filed for this visit. There is no  height or weight on file to calculate BMI. Wt Readings from Last 3 Encounters:  09/06/21 247 lb 3.2 oz (112.1 kg)  02/28/21 247 lb 6.4 oz (112.2 kg)  11/22/20 257 lb 9.6 oz (116.8 kg)    Physical Exam Vitals reviewed.  Constitutional:      General: She is not in acute distress. HENT:     Head: Normocephalic.  Cardiovascular:     Rate and Rhythm: Tachycardia present. Rhythm irregular.     Pulses: Normal pulses.     Heart sounds: Normal heart sounds. No murmur heard. Pulmonary:     Effort: Pulmonary effort is normal. No respiratory distress.     Breath sounds: Normal breath sounds. No wheezing.  Abdominal:     General: Bowel sounds are normal. There is no distension.     Palpations: Abdomen is soft.     Tenderness: There is no abdominal tenderness.  Musculoskeletal:     Right lower leg: Edema present.     Left lower leg: Edema present.     Comments: Non-pitting  Skin:    General: Skin is warm and dry.     Capillary Refill: Capillary refill takes less than 2 seconds.  Neurological:     General: No focal deficit present.     Mental Status: She is alert and oriented to person, place, and time.     Motor: Weakness present.     Gait: Gait abnormal.  Psychiatric:        Mood and Affect: Mood normal.        Behavior: Behavior normal.    Labs reviewed: Basic Metabolic Panel: Recent Labs    05/02/21 0902  NA 142  K 4.2  CL 107  CO2 24  GLUCOSE 106*  BUN 18  CREATININE 1.35*  CALCIUM 9.7  TSH 1.43   Liver Function Tests: Recent Labs    05/02/21 0902  AST 13  ALT 12  BILITOT 0.5  PROT 7.4   No results for input(s): LIPASE, AMYLASE in the last 8760 hours. No results for input(s): AMMONIA in the last 8760 hours. CBC: Recent Labs    05/02/21 0902  WBC 9.3  NEUTROABS 3,897  HGB 14.1  HCT 44.9  MCV 86.5  PLT 279   Lipid Panel: Recent Labs    05/02/21 0902  CHOL 177  HDL 70  LDLCALC 93  TRIG 57  CHOLHDL 2.5   TSH: Recent Labs    05/02/21 0902   TSH 1.43   A1C: Lab Results  Component Value Date   HGBA1C 5.4 05/02/2021     Assessment/Plan 1. Atrial fibrillation, unspecified type ([place]) -  11/29 afib with rvr, HR 109 - Aflutter in office today, rate 138 - sob and weakness today- sats 94% on room air - send to ED for further evaluation  2. Abnormal EKG - see above - EKG 12-Lead  Greater than 50% of total time spent discussing abnormal EKG and treatment.    Next appt: none Efrain Clauson [place], [facility] [phone]

## 2021-11-08 NOTE — ED Provider Notes (Addendum)
Emergency Medicine Provider Triage Evaluation Note  [name] , a 75 y.o. female  was evaluated in triage.  Pt complains of shortness of breath.  Patient accompanied by daughter from primary care.  States that went to primary care this morning for follow-up of cataract surgery on Tuesday.  They obtained EKG which noted abnormal rhythm and sent her to the emergency department.  Daughter states that during her surgery on Tuesday the anesthesiologist noted that she had an abnormal rhythm as well.  States that patient has been short of breath over the past few days.  She denies known fevers.  Denies cough, denies chest pain or palpitations.  Not anticoagulated  Review of Systems  Positive: See above Negative:   Physical Exam  BP (!) 142/73 (BP Location: Left Arm)   Pulse (!) 131   Temp 99 F (37.2 C)   Resp 18   SpO2 99%  Gen:   Awake, no distress   Resp:  Tachypnea with shallow breathing, no evidence of cyanosis MSK:   Moves extremities without difficulty  Other:  S1/S2 with irregularly irregular rhythm on auscultation.  Pulses currently.  1+ bilateral lower extremity pitting edema.  Medical Decision Making  Medically screening exam initiated at 12:31 PM.  Appropriate orders placed.  Terrace Arabia Hartl was informed that the remainder of the evaluation will be completed by another provider, this initial triage assessment does not replace that evaluation, and the importance of remaining in the ED until their evaluation is complete.  Nursing aware the patient need expedited room for A. fib with RVR   Mickie Hillier, PA-C 11/08/21 1232    Mickie Hillier, PA-C 11/08/21 1234    Horton, Alvin Critchley, DO 11/08/21 1644

## 2021-11-08 NOTE — H&P (Signed)
History and Physical    PLEASE NOTE THAT DRAGON DICTATION SOFTWARE WAS USED IN THE CONSTRUCTION OF THIS NOTE.   LAURELL COALSON XBM:[id number] DOB: [birth year] DOA: 11/08/2021  PCP: Sandrea Hughs, NP  Patient coming from: home   I have personally briefly reviewed patient's old medical records in [place]  Chief Complaint: New onset atrial fibrillation with RVR  HPI: [name] is a 75 y.o. female with medical history significant for stage IIIb chronic kidney disease with baseline creatinine 1.3-1.4, hypertension, hyperlipidemia, who is admitted to [facility] on 11/08/2021 with new diagnosis of acute heart failure with preserved EF as well as new diagnosis of paroxysmal atrial fibrillation after presenting to [facility] ED at the instruction of her for further evaluation of new diagnosis of atrial fibrillation with RVR.   3 days ago, the patient underwent cataract removal, at which time preoperative EKG reportedly demonstrated evidence of new diagnosis of atrial fibrillation, although reportedly without rapid ventricular response at that time.  On the basis of this new diagnosis of atrial fibrillation, the patient was instructed to follow-up with her PCP for further evaluation consequently, she is still her PCP for this purpose earlier today, at which time EKG in PCP office demonstrated atrial fibrillation with RVR and ventricular rate of 138, prompting the patient to be sent to [facility] ED for further evaluation and management.  The patient confirms no prior history of known atrial fibrillation.  She also denies any known history of underlying heart failure.  Not on any diuretics at home.  Per chart review, no prior echo results identified.  Over the last 3 to 4 days, patient conveys that she has been experiencing mild, new onset shortness of breath without significant orthopnea or PND.  She does however note new onset edema in the bilateral lower extremities over that timeframe.  Not  associate with any chest pain, palpitations, diaphoresis, nausea, vomiting, dizziness, presyncope, or syncope.  No recent subjective fever, chills, rigors, or generalized myalgias.  No recent calf tenderness, new lower extremity erythema, cough, hemoptysis.  Denies recent dysuria, gross hematuria, abdominal pain.   Denies any history of routine or recent alcohol consumption, denies any history of recreational drug use.  Denies abdominal daily baby aspirin does not patient, but no additional blood thinners, including no formal anticoagulation.    ED Course:  Vital signs in the ED were notable for the following: Temperature max 99.0, initial heart rates in the range of 127-140, associated with atrial fibrillation with RVR, for spontaneous conversion to sinus rhythm, with ensuing heart rates in the range of 84-90; blood pressure 115/78 -133/78; respiratory rate 15-22, oxygen saturation 94 to 99% on room air.  Labs were notable for the following: BMP notable for the following: Sodium 1 3, potassium 4.7 creatinine 1.40 relative to most recent prior creatinine did 1 of 1.35 in May 2022.  BNP 408, without prior BNP data point available for point comparison initial high-sensitivity troponin I found to be 12, with repeat value trending up slightly to 14.  CBC showed platelets of about 9900, hemoglobin 14.6.  TSH 1.419.  Screening COVID-19/[place] PCR checked in the ED today, with results currently pending.  Imaging and additional notable ED work-up: Initial EKG in the ED demonstrated atrial fibrillation with RVR, ventricular rate 140, no evidence of ST changes, including no evidence of ST elevation.  Following spontaneous conversion to sinus rhythm, repeat EKG showed sinus rhythm with heart rate 94, normal intervals, and no evidence  of T wave or ST changes.  Chest x-ray showed borderline cardiomegaly with vascular congestion and mild pulmonary edema.  Additionally, chest x-ray showed patchy opacities in the right  base, which may reflect atelectasis or infection.  While in the ED, the following were administered: Eliquis 5 mg p.o. x1, Lasix 40 mg IV x1, and Lopressor 2.5 mg IV x1.  Subsequently, the patient is being admitted for further evaluation management of new diagnosis of acute heart failure with preserved EF as well as new diagnosis of paroxysmal atrial fibrillation.     Review of Systems: As per HPI otherwise 10 point review of systems negative.   Past Medical History:  Diagnosis Date   Cataract    slow growing    Chronic kidney disease    Hyperlipidemia    Hypertension    Insomnia, unspecified    Memory loss    mild dementia per daughter    Osteopenia    Other abnormal blood chemistry    Other specified cardiac dysrhythmias(427.89)    Other specified disease of nail    Stroke ([place])    2006 or 2007 per daughter    Syncope and collapse    Unspecified disorder of kidney and ureter    Unspecified late effects of cerebrovascular disease    Unspecified transient cerebral ischemia    Unspecified urinary incontinence    Unspecified vitamin D deficiency    Vascular dementia, uncomplicated ([place])     Past Surgical History:  Procedure Laterality Date   NO PAST SURGERIES      Social History:  reports that she has quit smoking. Her smoking use included cigarettes. She has never used smokeless tobacco. She reports that she does not drink alcohol and does not use drugs.   No Known Allergies  Family History  Problem Relation Age of Onset   Pneumonia Mother    Kidney disease Sister    Hypertension Brother    Colon cancer Neg Hx    Colon polyps Neg Hx    Esophageal cancer Neg Hx    Rectal cancer Neg Hx    Stomach cancer Neg Hx      Prior to Admission medications   Medication Sig Start Date End Date Taking? Authorizing Provider  amLODipine (NORVASC) 10 MG tablet Take 1 tablet (10 mg total) by mouth daily. for blood pressure 06/21/21   Ngetich, Dinah C, NP  aspirin EC 81 MG  tablet Take 81 mg by mouth daily.    [provider]  brimonidine (ALPHAGAN) 0.2 % ophthalmic solution Place 1 drop into the right eye 4 (four) times daily. 10/02/21   [provider]  cholecalciferol (VITAMIN D) 1000 units tablet Take 1,000 Units by mouth daily.    [provider]  hydrALAZINE (APRESOLINE) 25 MG tablet TAKE 1 TABLET THREE TIMES DAILY WITH MEALS TO CONTROL BLOOD PRESSURE 06/21/21   Ngetich, Dinah C, NP  losartan (COZAAR) 50 MG tablet TAKE 1 AND 1/2 TABLETS EVERY DAY 11/05/21   Ngetich, Dinah C, NP  Multiple Vitamin (MULTIVITAMIN) tablet Take 1 tablet by mouth daily.    [provider]  NAMZARIC 28-10 MG CP24 TAKE 1 CAPSULE  DAILY TO PRESERVE MEMORY 11/05/21   Ngetich, Dinah C, NP  oxybutynin (DITROPAN) 5 MG tablet TAKE 1 TABLET ONE TIME DAILY FOR BLADDER CONTROL 06/21/21   Ngetich, Dinah C, NP  simvastatin (ZOCOR) 40 MG tablet TAKE 1 TABLET EVERY DAY AT [facility] 06/21/21   Ngetich, Nelda Bucks, NP     Objective  Physical Exam: Vitals:   11/08/21 1545 11/08/21 1630 11/08/21 1705 11/08/21 1900  BP: 128/63 128/76 128/76 133/78  Pulse: 90  89 84  Resp: (!) 22 (!) 28 (!) 28 (!) 21  Temp:      SpO2: 92%  94% 95%    General: appears to be stated age; alert, oriented Skin: warm, dry, no rash Head:  AT/Portal Mouth:  Oral mucosa membranes appear moist, normal dentition Neck: supple; trachea midline Heart:  RRR; did not appreciate any M/R/G Lungs: Bibasilar crackles noted, otherwise CTAB, did not appreciate any wheezes, or rhonchi Abdomen: + BS; soft, ND, NT Vascular: 2+ pedal pulses b/l; 2+ radial pulses b/l Extremities: 2+ edema in the bilateral lower extremities; no muscle wasting Neuro: strength and sensation intact in upper and lower extremities b/l    Labs on Admission: I have personally reviewed following labs and imaging studies  CBC: Recent Labs  Lab 11/08/21 1231  WBC 9.9  NEUTROABS 7.6  HGB 14.6  HCT 45.8  MCV 90.7  PLT 073    Basic Metabolic Panel: Recent Labs  Lab 11/08/21 1231 11/08/21 1302  NA 138  --   K 4.7  --   CL 104  --   CO2 21*  --   GLUCOSE 131*  --   BUN 19  --   CREATININE 1.40*  --   CALCIUM 9.7  --   MG  --  2.1   GFR: Estimated Creatinine Clearance: 43.4 mL/min (A) (by C-G formula based on SCr of 1.4 mg/dL (H)). Liver Function Tests: Recent Labs  Lab 11/08/21 1231  AST 25  ALT 25  ALKPHOS 97  BILITOT 1.1  PROT 7.9  ALBUMIN 3.9   No results for input(s): LIPASE, AMYLASE in the last 168 hours. No results for input(s): AMMONIA in the last 168 hours. Coagulation Profile: No results for input(s): INR, PROTIME in the last 168 hours. Cardiac Enzymes: No results for input(s): CKTOTAL, CKMB, CKMBINDEX, TROPONINI in the last 168 hours. BNP (last 3 results) No results for input(s): PROBNP in the last 8760 hours. HbA1C: No results for input(s): HGBA1C in the last 72 hours. CBG: No results for input(s): GLUCAP in the last 168 hours. Lipid Profile: No results for input(s): CHOL, HDL, LDLCALC, TRIG, CHOLHDL, LDLDIRECT in the last 72 hours. Thyroid Function Tests: Recent Labs    11/08/21 1302  TSH 1.419   Anemia Panel: No results for input(s): VITAMINB12, FOLATE, FERRITIN, TIBC, IRON, RETICCTPCT in the last 72 hours. Urine analysis: No results found for: COLORURINE, APPEARANCEUR, LABSPEC, [place], GLUCOSEU, HGBUR, BILIRUBINUR, KETONESUR, PROTEINUR, UROBILINOGEN, NITRITE, LEUKOCYTESUR  Radiological Exams on Admission: DG Chest 2 View  Result Date: 11/08/2021 CLINICAL DATA:  Shortness of breath, irregular heartbeat EXAM: CHEST - 2 VIEW COMPARISON:  Chest radiograph 03/08/2014 FINDINGS: The heart is at the upper limits of normal for size. There are patchy opacities in the right lung base. There is vascular congestion with possible mild pulmonary interstitial edema. There is no pleural effusion or pneumothorax. There is no acute osseous abnormality. IMPRESSION: 1. Borderline  cardiomegaly with vascular congestion and possible mild pulmonary interstitial edema. 2. Patchy opacities in the right lung base may reflect atelectasis or infection. Electronically Signed   By: Valetta Mole M.D.   On: 11/08/2021 13:01     EKG: Independently reviewed, with result as described above.    Assessment/Plan   Principal Problem:   Acute heart failure with preserved ejection fraction (HCC) Active Problems:   Hyperlipidemia   Benign  hypertension with CKD (chronic kidney disease) stage III (HCC)   AF (paroxysmal atrial fibrillation) (HCC)   SOB (shortness of breath)   CKD (chronic kidney disease) stage 3, GFR 30-59 ml/min (HCC)   Urinary incontinence     #) Acute heart failure with preserved EF: New diagnosis, in the setting of 3 days new onset shortness of breath associated with new onset peripheral edema, elevated BNP, tachypnea on exam, and chest x-ray showing borderline cardiomegaly with vascular congestion, and mild pulmonary edema.  Presentation also notable for new diagnosis of paroxysmal atrial fibrillation, raising question of new atrial fibrillation on the basis of new congestive heart failure versus the recent approval of this sequence.  Overall, ACS is causative factor leading to acutely decompensated heart failure appears less likely, particular given that presentation not associate any chest pain, and troponin x2 nonelevated, while EKG demonstrates no evidence of overt acute ischemic changes, including no evidence of ST elevation.  Received Lasix 40 mg IV x1 dose in the ED.  Not on a diuretics as outpatient.  No prior echo per chart review.   Plan: monitor strict I's & O's and daily weights. Repeat BMP in the morning. Check serum magnesium level.  Lasix 40 mg IV twice daily, with next dose in the morning.  Monitor on telemetry.  Echocardiogram ordered for the morning.  Further evaluation management of new diagnosis of paroxysmal atrial fibrillation, as further detailed  below.  Continue to trend troponin.  Check procalcitonin given chest x-ray finding of patchy opacity in the right base concerning for atelectasis versus infection.        #) Paroxysmal atrial fibrillation: Appears to represent a new diagnosis, requiring further evaluation, with finding of atrial fibrillation with RVR as an outpatient.  Today, with associated heart rates in the 130s, with confirmation per EKG upon arrival at ED today demonstrating A. fib RVR with ventricular rate of 140, without overt evidence of acute ischemic changes.  Ensuing spontaneous conversion to sinus rhythm.  Aside from the aforementioned mild shortness of breath over the last 3 days with new onset edema in the bilateral extremities, patient appears to otherwise been asymptomatic while in A. fib RVR.  Unclear at this time if new diagnosis of acutely decompensated heart failure is a cause or consequence of this new finding of atrial fibrillation.  We will pursue additional diuresis, obtain echocardiogram, and lot ensuing heart rate, acknowledging potential tachycardia is compensatory mechanism in the setting of acutely decompensated heart failure.   In the setting of a CHA2DS2-VASc score of 7, there is an indication for the patient to be on chronic anticoagulation for thromboembolic prophylaxis.  Consequently, the patient received a dose of Eliquis via EDP, with plan to continue his anticoagulant.  Ultimately warranted initiation of scheduled AV nodal blocking agent, however, will refrain from starting this at the present time, in order to allow a degree of compensatory tachycardia in the setting of acutely decompensated heart failure, particularly given no preceding beta-blocker at home.  Of note, TSH value within normal limits.   Plan: monitor strict I's & O's and daily weights. Repeat BMP and CBC in the morning.  Repeat serum mag level in the morning.  Prn IV Lopressor for sustained heart rate greater than 130.  Monitor on  telemetry.  Further evaluation management new diagnosis of acutely onset heart failure, as above, including IV diuresis.  Echo in the morning.  Continue Eliquis.  Check urinalysis and UDS.  Add on procalcitonin to further evaluate right  lower lobe patchy opacities.  Additionally, will hold home oxybutynin for now to avoid any contributory anticholinergic implications.       #) Stage IIIb chronic kidney disease: Documented history of such, associated baseline creatinine 1.3-1.4, presenting labs reflecting creatinine consistent with this range.  Close monitoring of ensuing renal function, particularly given plan for interval IV diuresis for new onset heart failure, as above  Plan: Monitor strict I's and O's and daily weights.  Repeat BMP in the morning.      #) Essential Hypertension: documented history of such, with outpatient antihypertensive regimen including losartan, Norvasc, and hydralazine.  Systolic blood pressures pressure in the ED today: 110's - 130's mmHg, without any evidence of hypotension, including at time of atrial fibrillation with RVR.  In setting of history of stage IIIb chronic kidney disease and plan for IV diuresis, will hold home losartan for now, we will closely monitoring ensuing renal function.  Plan: Hold home losartan and Norvasc for now.  Continue hydralazine.  Monitor strict I's and O's and daily weights.  Close monitoring of subsequent blood pressure via routine vital signs.        #) Hyperlipidemia: documented h/o such. On simvastatin as outpatient.    Plan: continue home statin.        #) Urinary incontinence: Documented history of such, on oxybutynin at home.  In the setting of presenting new diagnosis of atrial fibrillation with RVR, will hold home oxybutynin for now.  Avoid any contributory anticholinergic implications of this med, as well as to prevent urinary retention in the setting of plan for IV diuresis.  Plan: Hold home oxybutynin for now,  as above.  Monitor strict I's and O's      DVT prophylaxis: SCD's plus new eliquis  Code Status: DNR (per chart review and confirmed via patient) Family Communication: Case discussed with patient's daughter, who is present at bedside Disposition Plan: Per Rounding Team Consults called: none;  Admission status: inpatient  Warrants inpatient status on basis of need for further evaluation management of new diagnosis of acute heart failure with preserved EF, including need for IV diuresis as well as close monitoring of ensuing renal function in the context of a known history of stage IIIb chronic kidney disease, in addition to further evaluation and management of new diagnosis of atrial fibrillation with RVR, including monitoring on telemetry for close monitoring of ensuing rate/rhythm.   PLEASE NOTE THAT DRAGON DICTATION SOFTWARE WAS USED IN THE CONSTRUCTION OF THIS NOTE.   [place] DO [facility]  From [place]   11/08/2021, 7:21 PM

## 2021-11-08 NOTE — ED Provider Notes (Signed)
Patient has new onset paroxysmal atrial fibrillation. Afib RVR resolved spontaneously shortly after arrival to the emergency department.  She has been more short of breath recently.  Patient's daughter has objectively noted her to be more short of breath particularly with exertion over about the past 3 days.      Physical Exam  BP 128/76 (BP Location: Left Arm)   Pulse 89   Temp 99 F (37.2 C)   Resp (!) 28   SpO2 94%   Physical Exam Patient is alert.  Mental status clear. Mild increased work of breathing at rest.  Occasional expiratory wheeze and basilar crackle 1+ pitting bilateral lower extremities. ED Course/Procedures     Procedures Angiocath insertion Performed by: Charlesetta Shanks  Consent: Verbal consent obtained. Risks and benefits: risks, benefits and alternatives were discussed Time out: Immediately prior to procedure a "time out" was called to verify the correct patient, procedure, equipment, support staff and site/side marked as required.  Preparation: Patient was prepped and draped in the usual sterile fashion.  Vein Location: right antecubital  Yes Ultrasound Guided  Gauge: 20g long  Normal blood return and flush without difficulty Patient tolerance: Patient tolerated the procedure well with no immediate complications.  Attempted ultrasound-guided peripheral IV on left antecubital.  Blood return but not able to thread.  IV established and right AC.   MDM  Accepted from Dr. Dina Rich at signout.  Plan to follow-up on second troponin and BNP.  Plan is to reassess for admission versus discharge for paroxysmal atrial fibrillation with some signs of possible CHF.    Patient [place] vas score is 6.  She is objectively dyspneic at rest.  Patient ambulated and heart rate went up to 140.  Chest x-ray shows vascular congestion and BNP is elevated at 400.  At this time I do feel patient has acute CHF associated with paroxysmal atrial fibrillation, she does have high risk for  thrombus formation with Chads Vasc score 6.  Due to acute CHF and paroxysmal atrial fibrillation with objective dyspnea, will admit for further diagnostic evaluation and diuresis.  Lasix, Lopressor and Eliquis started in the emergency department.  CRITICAL CARE Performed by: Charlesetta Shanks   Total critical care time: 30 minutes  Critical care time was exclusive of separately billable procedures and treating other patients.  Critical care was necessary to treat or prevent imminent or life-threatening deterioration.  Critical care was time spent personally by me on the following activities: development of treatment plan with patient and/or surrogate as well as nursing, discussions with consultants, evaluation of patient's response to treatment, examination of patient, obtaining history from patient or surrogate, ordering and performing treatments and interventions, ordering and review of laboratory studies, ordering and review of radiographic studies, pulse oximetry and re-evaluation of patient's condition.        Charlesetta Shanks, MD 11/08/21 [phone]

## 2021-11-08 NOTE — ED Provider Notes (Signed)
[facility] EMERGENCY DEPARTMENT Provider Note   CSN: [id number] Arrival date & time: 11/08/21  1200     History Chief Complaint  Patient presents with   Irregular Heart Beat    [name] is a 75 y.o. female.  HPI  75 year old female past medical history of HTN, HLD, CKD, dementia presents emergency department concern for atrial fibrillation.  Patient and daughter history givers.  They state earlier this week on Tuesday when she was having cataract surgery the anesthesiologist noticed a "a change in rhythm".  This self resolved.  They recommended that she follow-up with her primary doctor.  She was seen by her primary doctor today and they noted that the patient was in atrial fibrillation and sent her here for evaluation.  Patient has had mild fatigue for the past week but denies any chest pain, palpitations, shortness of breath.  No swelling of her lower extremities.  She denies any headache or acute neuro change.  Has otherwise been in her usual state of health.  Past Medical History:  Diagnosis Date   Cataract    slow growing    Chronic kidney disease    Hyperlipidemia    Hypertension    Insomnia, unspecified    Memory loss    mild dementia per daughter    Osteopenia    Other abnormal blood chemistry    Other specified cardiac dysrhythmias(427.89)    Other specified disease of nail    Stroke ([place])    2006 or 2007 per daughter    Syncope and collapse    Unspecified disorder of kidney and ureter    Unspecified late effects of cerebrovascular disease    Unspecified transient cerebral ischemia    Unspecified urinary incontinence    Unspecified vitamin D deficiency    Vascular dementia, uncomplicated ([place])     Patient Active Problem List   Diagnosis Date Noted   Benign hypertension with CKD (chronic kidney disease) stage III ([place]) 09/06/2021   Late effects of cerebrovascular disease    BMI 40.0-44.9, adult ([place]) 06/27/2019   Urge urinary  incontinence 03/03/2016   Hyperglycemia 03/03/2016   Vascular dementia, uncomplicated (HCC)    Hyperlipidemia    Chronic kidney disease     Past Surgical History:  Procedure Laterality Date   NO PAST SURGERIES       OB History   No obstetric history on file.     Family History  Problem Relation Age of Onset   Pneumonia Mother    Kidney disease Sister    Hypertension Brother    Colon cancer Neg Hx    Colon polyps Neg Hx    Esophageal cancer Neg Hx    Rectal cancer Neg Hx    Stomach cancer Neg Hx     Social History   Tobacco Use   Smoking status: Former    Years: 30.00    Types: Cigarettes   Smokeless tobacco: Never   Tobacco comments:    Quit about 7-8 years ago as of 2015   Substance Use Topics   Alcohol use: No   Drug use: No    Home Medications Prior to Admission medications   Medication Sig Start Date End Date Taking? Authorizing Provider  amLODipine (NORVASC) 10 MG tablet Take 1 tablet (10 mg total) by mouth daily. for blood pressure 06/21/21   Ngetich, Dinah C, NP  aspirin EC 81 MG tablet Take 81 mg by mouth daily.    [provider]  brimonidine (  ALPHAGAN) 0.2 % ophthalmic solution Place 1 drop into the right eye 4 (four) times daily. 10/02/21   [provider]  cholecalciferol (VITAMIN D) 1000 units tablet Take 1,000 Units by mouth daily.    [provider]  hydrALAZINE (APRESOLINE) 25 MG tablet TAKE 1 TABLET THREE TIMES DAILY WITH MEALS TO CONTROL BLOOD PRESSURE 06/21/21   Ngetich, Dinah C, NP  losartan (COZAAR) 50 MG tablet TAKE 1 AND 1/2 TABLETS EVERY DAY 11/05/21   Ngetich, Dinah C, NP  Multiple Vitamin (MULTIVITAMIN) tablet Take 1 tablet by mouth daily.    [provider]  NAMZARIC 28-10 MG CP24 TAKE 1 CAPSULE  DAILY TO PRESERVE MEMORY 11/05/21   Ngetich, Dinah C, NP  oxybutynin (DITROPAN) 5 MG tablet TAKE 1 TABLET ONE TIME DAILY FOR BLADDER CONTROL 06/21/21   Ngetich, Dinah C, NP  simvastatin (ZOCOR) 40 MG tablet  TAKE 1 TABLET EVERY DAY AT [facility] 06/21/21   Ngetich, Nelda Bucks, NP    Allergies    Patient has no known allergies.  Review of Systems   Review of Systems  Constitutional:  Positive for fatigue. Negative for chills and fever.  HENT:  Negative for congestion.   Eyes:  Negative for visual disturbance.  Respiratory:  Negative for chest tightness and shortness of breath.   Cardiovascular:  Negative for chest pain, palpitations and leg swelling.       +change in heart rhythm  Gastrointestinal:  Negative for abdominal pain, diarrhea and vomiting.  Genitourinary:  Negative for dysuria.  Skin:  Negative for rash.  Neurological:  Negative for headaches.   Physical Exam Updated Vital Signs BP 113/76 (BP Location: Left Arm)   Pulse 86   Temp 99 F (37.2 C)   Resp 20   SpO2 97%   Physical Exam Vitals and nursing note reviewed.  Constitutional:      General: She is not in acute distress.    Appearance: Normal appearance. She is not diaphoretic.  HENT:     Head: Normocephalic.     Mouth/Throat:     Mouth: Mucous membranes are moist.  Eyes:     Pupils: Pupils are equal, round, and reactive to light.  Cardiovascular:     Rate and Rhythm: Normal rate and regular rhythm.  Pulmonary:     Effort: Pulmonary effort is normal. No respiratory distress.  Abdominal:     Palpations: Abdomen is soft.     Tenderness: There is no abdominal tenderness.  Musculoskeletal:        General: No swelling.  Skin:    General: Skin is warm.  Neurological:     Mental Status: She is alert and oriented to person, place, and time. Mental status is at baseline.  Psychiatric:        Mood and Affect: Mood normal.    ED Results / Procedures / Treatments   Labs (all labs ordered are listed, but only abnormal results are displayed) Labs Reviewed  COMPREHENSIVE METABOLIC PANEL - Abnormal; Notable for the following components:      Result Value   CO2 21 (*)    Glucose, Bld 131 (*)    Creatinine, Ser 1.40 (*)     GFR, Estimated 39 (*)    All other components within normal limits  CBC WITH DIFFERENTIAL/PLATELET  MAGNESIUM  TSH  TROPONIN I (HIGH SENSITIVITY)  TROPONIN I (HIGH SENSITIVITY)    EKG None  Radiology DG Chest 2 View  Result Date: 11/08/2021 CLINICAL DATA:  Shortness of breath, irregular  heartbeat EXAM: CHEST - 2 VIEW COMPARISON:  Chest radiograph 03/08/2014 FINDINGS: The heart is at the upper limits of normal for size. There are patchy opacities in the right lung base. There is vascular congestion with possible mild pulmonary interstitial edema. There is no pleural effusion or pneumothorax. There is no acute osseous abnormality. IMPRESSION: 1. Borderline cardiomegaly with vascular congestion and possible mild pulmonary interstitial edema. 2. Patchy opacities in the right lung base may reflect atelectasis or infection. Electronically Signed   By: Valetta Mole M.D.   On: 11/08/2021 13:01    Procedures Procedures   Medications Ordered in ED Medications - No data to display  ED Course  I have reviewed the triage vital signs and the nursing notes.  Pertinent labs & imaging results that were available during my care of the patient were reviewed by me and considered in my medical decision making (see chart for details).    MDM Rules/Calculators/A&P                           75 year old female presents the emergency department with concern for atrial fibrillation.  Noticed to be in atrial fibrillation with RVR on arrival.  Stable blood pressure.  Patient was reportedly asymptomatic in triage.  On my evaluation of the patient in the room she has spontaneously converted to normal sinus rhythm.  She is sitting up, well-appearing, no active complaints.  Vitals normal.  Blood work is reassuring, first troponin is negative.  Electrolytes are normal.  Chest x-ray shows findings of cardiomegaly and possible mild pulmonary interstitial edema.  She has no known history of CHF.  Will plan for  repeat troponin and BNP to complete evaluation of possible CHF.  If this is negative and patient remains in sinus and believe she may be considered for DC pending re evaluation.  Final Clinical Impression(s) / ED Diagnoses Final diagnoses:  None    Rx / DC Orders ED Discharge Orders     None        Lorelle Gibbs, DO 11/08/21 1625

## 2021-11-08 NOTE — Patient Instructions (Signed)
  Please report to ED for further evaluation.  

## 2021-11-09 ENCOUNTER — Inpatient Hospital Stay (HOSPITAL_COMMUNITY): Payer: Medicare HMO

## 2021-11-09 DIAGNOSIS — I5031 Acute diastolic (congestive) heart failure: Secondary | ICD-10-CM | POA: Diagnosis not present

## 2021-11-09 LAB — ECHOCARDIOGRAM COMPLETE
AR max vel: 1.97 cm2
AV Peak grad: 12.1 mmHg
Ao pk vel: 1.74 m/s
Area-P 1/2: 2.53 cm2
Calc EF: 58.7 %
Height: 65 in
S' Lateral: 3.4 cm
Single Plane A2C EF: 59.5 %
Single Plane A4C EF: 58.2 %
Weight: 4063.52 oz

## 2021-11-09 LAB — CBC WITH DIFFERENTIAL/PLATELET
Abs Immature Granulocytes: 0.02 10*3/uL (ref 0.00–0.07)
Basophils Absolute: 0 10*3/uL (ref 0.0–0.1)
Basophils Relative: 0 %
Eosinophils Absolute: 0 10*3/uL (ref 0.0–0.5)
Eosinophils Relative: 0 %
HCT: 38.8 % (ref 36.0–46.0)
Hemoglobin: 12.7 g/dL (ref 12.0–15.0)
Immature Granulocytes: 0 %
Lymphocytes Relative: 26 %
Lymphs Abs: 2.6 10*3/uL (ref 0.7–4.0)
MCH: 28.3 pg (ref 26.0–34.0)
MCHC: 32.7 g/dL (ref 30.0–36.0)
MCV: 86.6 fL (ref 80.0–100.0)
Monocytes Absolute: 1.4 10*3/uL — ABNORMAL HIGH (ref 0.1–1.0)
Monocytes Relative: 14 %
Neutro Abs: 6 10*3/uL (ref 1.7–7.7)
Neutrophils Relative %: 60 %
Platelets: 225 10*3/uL (ref 150–400)
RBC: 4.48 MIL/uL (ref 3.87–5.11)
RDW: 14 % (ref 11.5–15.5)
WBC: 10 10*3/uL (ref 4.0–10.5)
nRBC: 0 % (ref 0.0–0.2)

## 2021-11-09 LAB — COMPREHENSIVE METABOLIC PANEL
ALT: 20 U/L (ref 0–44)
AST: 21 U/L (ref 15–41)
Albumin: 3.4 g/dL — ABNORMAL LOW (ref 3.5–5.0)
Alkaline Phosphatase: 81 U/L (ref 38–126)
Anion gap: 9 (ref 5–15)
BUN: 18 mg/dL (ref 8–23)
CO2: 26 mmol/L (ref 22–32)
Calcium: 9.3 mg/dL (ref 8.9–10.3)
Chloride: 103 mmol/L (ref 98–111)
Creatinine, Ser: 1.53 mg/dL — ABNORMAL HIGH (ref 0.44–1.00)
GFR, Estimated: 35 mL/min — ABNORMAL LOW (ref >60)
Glucose, Bld: 110 mg/dL — ABNORMAL HIGH (ref 70–99)
Potassium: 4.2 mmol/L (ref 3.5–5.1)
Sodium: 138 mmol/L (ref 135–145)
Total Bilirubin: 0.9 mg/dL (ref 0.3–1.2)
Total Protein: 6.7 g/dL (ref 6.5–8.1)

## 2021-11-09 LAB — TROPONIN I (HIGH SENSITIVITY): Troponin I (High Sensitivity): 16 ng/L (ref <18)

## 2021-11-09 LAB — MAGNESIUM: Magnesium: 2 mg/dL (ref 1.7–2.4)

## 2021-11-09 LAB — PROCALCITONIN: Procalcitonin: <0.1 ng/mL

## 2021-11-09 NOTE — Assessment & Plan Note (Addendum)
-  New diagnosis, in the setting of 3 days new onset shortness of breath associated with new onset peripheral edema, elevated BNP, tachypnea on exam, and chest x-ray showing borderline cardiomegaly with vascular congestion, and mild pulmonary edema.   - strict I's & O's and daily weights- weight down  -Echocardiogram: Left ventricular ejection fraction, by estimation, is 65 to 70%. The  left ventricle has normal function. The left ventricle has no regional  wall motion abnormalities. Left ventricular diastolic parameters are  indeterminate. Elevated left ventricular  end-diastolic pressure.

## 2021-11-09 NOTE — Assessment & Plan Note (Addendum)
-  baseline creatinine 1.3-1.4, presenting labs reflecting creatinine consistent with this range.  Close monitoring of ensuing renal function, outpatient BMP

## 2021-11-09 NOTE — Assessment & Plan Note (Addendum)
Estimated body mass index is 41.68 kg/m as calculated from the following:   Height as of an earlier encounter on 11/08/21: 5\' 5"  (1.651 m).   Weight as of this encounter: 113.6 kg.

## 2021-11-09 NOTE — Assessment & Plan Note (Signed)
-  on simvastatin

## 2021-11-09 NOTE — Assessment & Plan Note (Addendum)
-  resume losartan -hold norvasc -Continue hydralazine.   -continue diuretics -add BB

## 2021-11-09 NOTE — Progress Notes (Signed)
  Progress Note    [name]   PYP:[id number]  DOB: June 22, 1946  DOA: 11/08/2021     1 Date of Service: 11/09/2021   Clinical Course [name] is a 75 y.o. female with medical history significant for stage IIIb chronic kidney disease with baseline creatinine 1.3-1.4, hypertension, hyperlipidemia, who is admitted to [facility] on 11/08/2021 with new diagnosis of acute heart failure with preserved EF as well as new diagnosis of paroxysmal atrial fibrillation after presenting to [facility] ED at the instruction of her for further evaluation of new diagnosis of atrial fibrillation with RVR.     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    - strict I's & O's and daily weights. -Lasix 40 mg IV twice daily -telemetry.   -Echocardiogram    AF (paroxysmal atrial fibrillation) (HCC) Appears to represent a new diagnosis, requiring further evaluation, with finding of atrial fibrillation with RVR as an outpatient.    -referral to a fib clinic at d/c - CHA2DS2-VASc score of 7, -Ultimately warranted initiation of scheduled AV nodal blocking agent, however, will refrain from starting this at the present time, in order to allow a degree of compensatory tachycardia in the setting of acutely decompensated heart failure, particularly given no preceding beta-blocker at home.  - TSH value within normal limits. - strict I's & O's and daily weights. -Monitor on telemetry.  - Continue Eliquis.     CKD (chronic kidney disease) stage 3, GFR 30-59 ml/min (HCC) -baseline creatinine 1.3-1.4, presenting labs reflecting creatinine consistent with this range.  Close monitoring of ensuing renal function, particularly given plan for  interval IV diuresis for new onset heart failure, as above  Benign hypertension with CKD (chronic kidney disease) stage III (HCC) -Hold home losartan and Norvasc for now.   -Continue hydralazine.   -continue diuretics  BMI 40.0-44.9, adult (HCC) Estimated body mass index is 42.26 kg/m as calculated from the following:   Height as of an earlier encounter on 11/08/21: 5\' 5"  (1.651 m).   Weight as of this encounter: 115.2 kg.  Hyperlipidemia -on simvastatin    PT/OT eval   Subjective:  No currently complaints  Objective Vitals:   11/09/21 0700 11/09/21 0727 11/09/21 0900 11/09/21 1100  BP: 135/84 (!) 142/78 122/81 122/78  Pulse: 85 84 76 69  Resp: (!) 24 20 20  (!) 23  Temp:  98.4 F (36.9 C)    TempSrc:  Oral    SpO2: 95% 98% 96% 96%  Weight:       115.2 kg  Daughter updated at bedside   Exam  General: Appearance:    Severely obese female in no acute distress     Lungs:     respirations unlabored, not on O2  Heart:    Normal heart rate. Irregularly irregular rhythm. No murmurs, rubs, or gallops.   MS:   All extremities are intact.    Neurologic:   Awake, alert, pleasant and cooperative     Labs / Other Information Cr up slightly   Disposition Plan: Status is: Inpatient  Remains inpatient appropriate because: needs further work up       Time spent: 35 minutes [facility] 11/09/2021, 1:17 PM

## 2021-11-09 NOTE — Assessment & Plan Note (Addendum)
Appears to represent a new diagnosis, requiring further evaluation, with finding of atrial fibrillation with RVR as an outpatient.   -referral to a fib clinic at d/c - CHA2DS2-VASc score of 7, -low dose BB - TSH value within normal limits. - strict I's & O's and daily weights. - Continue Eliquis.

## 2021-11-09 NOTE — Discharge Instructions (Addendum)

## 2021-11-10 LAB — CBC
HCT: 41.2 % (ref 36.0–46.0)
Hemoglobin: 13.1 g/dL (ref 12.0–15.0)
MCH: 28 pg (ref 26.0–34.0)
MCHC: 31.8 g/dL (ref 30.0–36.0)
MCV: 88 fL (ref 80.0–100.0)
Platelets: 226 10*3/uL (ref 150–400)
RBC: 4.68 MIL/uL (ref 3.87–5.11)
RDW: 13.9 % (ref 11.5–15.5)
WBC: 8 10*3/uL (ref 4.0–10.5)
nRBC: 0 % (ref 0.0–0.2)

## 2021-11-10 LAB — BASIC METABOLIC PANEL
Anion gap: 8 (ref 5–15)
BUN: 17 mg/dL (ref 8–23)
CO2: 29 mmol/L (ref 22–32)
Calcium: 8.9 mg/dL (ref 8.9–10.3)
Chloride: 101 mmol/L (ref 98–111)
Creatinine, Ser: 1.5 mg/dL — ABNORMAL HIGH (ref 0.44–1.00)
GFR, Estimated: 36 mL/min — ABNORMAL LOW (ref >60)
Glucose, Bld: 114 mg/dL — ABNORMAL HIGH (ref 70–99)
Potassium: 3.6 mmol/L (ref 3.5–5.1)
Sodium: 138 mmol/L (ref 135–145)

## 2021-11-10 MED ORDER — POTASSIUM CHLORIDE CRYS ER 20 MEQ PO TBCR
20.0000 meq | EXTENDED_RELEASE_TABLET | Freq: Once | ORAL | Status: AC
  Administered 2021-11-10: 16:00:00 20 meq via ORAL
  Filled 2021-11-10: qty 1

## 2021-11-10 MED ORDER — OXYBUTYNIN CHLORIDE 5 MG PO TABS
5.0000 mg | ORAL_TABLET | Freq: Every evening | ORAL | Status: DC
  Administered 2021-11-10: 18:00:00 5 mg via ORAL
  Filled 2021-11-10: qty 1

## 2021-11-10 MED ORDER — FUROSEMIDE 10 MG/ML IJ SOLN
40.0000 mg | Freq: Every day | INTRAMUSCULAR | Status: DC
  Administered 2021-11-11: 40 mg via INTRAVENOUS
  Filled 2021-11-10: qty 4

## 2021-11-10 MED ORDER — MEMANTINE HCL ER 28 MG PO CP24
28.0000 mg | ORAL_CAPSULE | Freq: Every evening | ORAL | Status: DC
  Administered 2021-11-10: 19:00:00 28 mg via ORAL
  Filled 2021-11-10 (×4): qty 1

## 2021-11-10 MED ORDER — METOPROLOL TARTRATE 12.5 MG HALF TABLET
12.5000 mg | ORAL_TABLET | Freq: Two times a day (BID) | ORAL | Status: DC
  Administered 2021-11-10 – 2021-11-11 (×2): 12.5 mg via ORAL
  Filled 2021-11-10 (×2): qty 1

## 2021-11-10 MED ORDER — DONEPEZIL HCL 10 MG PO TABS
10.0000 mg | ORAL_TABLET | Freq: Every evening | ORAL | Status: DC
  Administered 2021-11-10: 18:00:00 10 mg via ORAL
  Filled 2021-11-10: qty 1

## 2021-11-10 NOTE — Progress Notes (Signed)
Progress Note    PATRICK SOHM   UXL:[id number]  DOB: 1946/05/03  DOA: 11/08/2021     2 Date of Service: 11/10/2021   Clinical Course Basilia Jumbo VANA ARIF is a 75 y.o. female with medical history significant for stage IIIb chronic kidney disease with baseline creatinine 1.3-1.4, hypertension, hyperlipidemia, who is admitted to [facility] on 11/08/2021 with new diagnosis of acute heart failure with preserved EF as well as new diagnosis of paroxysmal atrial fibrillation after presenting to [facility] ED at the instruction of her for further evaluation of new diagnosis of atrial fibrillation with RVR.   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    - strict I's & O's and daily weights- down 5L and 3 kg -Lasix 40 mg daily for now  -Echocardiogram: Left ventricular ejection fraction, by estimation, is 65 to 70%. The  left ventricle has normal function. The left ventricle has no regional  wall motion abnormalities. Left ventricular diastolic parameters are  indeterminate. Elevated left ventricular  end-diastolic pressure.    AF (paroxysmal atrial fibrillation) (HCC) Appears to represent a new diagnosis, requiring further evaluation, with finding of atrial fibrillation with RVR as an outpatient.    -referral to a fib clinic at d/c - CHA2DS2-VASc score of 7, -low dose BB - TSH value within normal limits. - strict I's & O's and daily weights. - Continue Eliquis.     CKD (chronic kidney disease) stage 3, GFR 30-59 ml/min (HCC) -baseline creatinine 1.3-1.4, presenting labs reflecting creatinine consistent with this range.  Close monitoring of ensuing renal function, particularly given plan for interval IV diuresis for  new onset heart failure, as above  Benign hypertension with CKD (chronic kidney disease) stage III (HCC) -Hold home losartan and Norvasc for now   -Continue hydralazine.   -continue diuretics -add BB  BMI 40.0-44.9, adult (HCC) Estimated body mass index is 41.31 kg/m as calculated from the following:   Height as of an earlier encounter on 11/08/21: 5\' 5"  (1.651 m).   Weight as of this encounter: 112.6 kg.  Hyperlipidemia -on simvastatin    Subjective:  Asking about going home  Objective Vitals:   11/10/21 0500 11/10/21 0600 11/10/21 0731 11/10/21 1121  BP: 123/76 118/67 139/82 (!) 131/53  Pulse: 64 79 89 86  Resp: (!) 21 (!) 21 (!) 22 (!) 22  Temp:   97.9 F (36.6 C) 97.9 F (36.6 C)  TempSrc:   Oral Oral  SpO2: 93% 93% 94% 97%  Weight:       112.6 kg     Exam  General: Appearance:    Severely obese female in no acute distress     Lungs:     Diminished, no wheezing,  respirations unlabored  Heart:    Normal heart rate. Tele showed back in sinus during my exam  MS:   All extremities are intact. Improved LE edema   Neurologic:   Awake, alert, oriented x 3. No apparent focal neurological           defect.      Labs / Other Information Cr stable   Disposition Plan: Status is: Inpatient  Remains inpatient appropriate because: home in 24 hours?  Improved wt, stable tele  Time spent: 35 minutes [facility] 11/10/2021, 2:22 PM

## 2021-11-10 NOTE — Progress Notes (Signed)
Occupational Therapy Evaluation  Pt presents with decreased balance, activity tolerance, and cognition. Pt currently requiring Mod A for LB ADLs and Min A for functional transfers/mobility. Pt lives with daughter who will be available to provide assistance at home as needed. Recommend HHOT to maximize safety/independence with ADLs, educate on DME/AE use, and ensure safe transition home. Will follow acutely.    11/10/21 1300  OT Visit Information  Last OT Received On 11/10/21  Assistance Needed +1  History of Present Illness 75 y.o. female presents to [facility] on 11/08/2021 with SOB and peripheral edema, newly diagnosed with afib with RVR and acute heart failure with preserved EF. PMH includes CKDIII, HTN, HLD.  Precautions  Precautions Fall  Restrictions  Weight Bearing Restrictions No  Home Living  Family/patient expects to be discharged to: Private residence  Living Arrangements Children  Available Help at Discharge Family;Available 24 hours/day  Type of Home House  Home Access Stairs to enter  Entrance Stairs-Number of Steps 1  Entrance Stairs-Rails None  Home Layout Two level;Able to live on main level with bedroom/bathroom  Set designer riser  Prior Function  Prior Level of Function  Independent/Modified Independent  Mobility Comments pt was able to ambulate household distances independently at baseline. Has required assistance for transfers and ambulation over the past week.  ADLs Comments Daughter assists with showering as needed  Communication  Communication No difficulties  Pain Assessment  Pain Assessment No/denies pain  Cognition  Arousal/Alertness Awake/alert  Behavior During Therapy WFL for tasks assessed/performed  Overall Cognitive Status Impaired/Different from baseline  Area of Impairment Problem solving;Safety/judgement;Awareness  Safety/Judgement Decreased awareness of safety;Decreased  awareness of deficits  Awareness Emergent  Problem Solving Slow processing  Upper Extremity Assessment  Upper Extremity Assessment Overall WFL for tasks assessed;RUE deficits/detail  RUE Deficits / Details Some residual motor and strength deficits from CVA approx. 8 years ago, however WFL overall.  Lower Extremity Assessment  Lower Extremity Assessment Defer to PT evaluation  ADL  Overall ADL's  Needs assistance/impaired  Eating/Feeding Independent  Grooming Set up;Sitting  Upper Body Bathing Supervision/ safety;Sitting  Lower Body Bathing Moderate assistance;Sit to/from stand  Upper Body Dressing  Set up;Supervision/safety;Sitting  Lower Body Dressing Moderate assistance;Sit to/from Retail buyer Minimal assistance;Ambulation;Comfort height toilet;Rolling walker (2 wheels);Grab bars  Bed Mobility  General bed mobility comments In recliner on arrival  Transfers  Overall transfer level Needs assistance  Equipment used Rolling walker (2 wheels)  Transfers Sit to/from Stand  Sit to Stand Min assist  General transfer comment Cues for safety and technique  Balance  Overall balance assessment Needs assistance  Sitting-balance support No upper extremity supported;Feet supported  Sitting balance-Leahy Scale Fair  Standing balance support Single extremity supported  Standing balance-Leahy Scale Poor  Standing balance comment minG-minA with UE support of walker or hand hold  General Comments  General comments (skin integrity, edema, etc.) VSS on RA  OT - End of Session  Equipment Utilized During Treatment Rolling walker (2 wheels)  Activity Tolerance Patient tolerated treatment well  Patient left in chair;with call bell/phone within reach;with chair alarm set;with family/visitor present  Nurse Communication Mobility status  OT Assessment  OT Recommendation/Assessment Patient needs continued OT Services  OT Visit Diagnosis Unsteadiness on feet (R26.81);Other abnormalities of  gait and mobility (R26.89);Other symptoms and signs involving cognitive function  OT Problem List Decreased activity tolerance;Impaired balance (sitting and/or standing);Decreased coordination;Decreased cognition;Decreased safety awareness;Decreased knowledge of use  of DME or AE;Decreased knowledge of precautions  OT Plan  OT Frequency (ACUTE ONLY) Min 2X/week  OT Treatment/Interventions (ACUTE ONLY) Self-care/ADL training;Therapeutic exercise;Neuromuscular education;Energy conservation;DME and/or AE instruction;Therapeutic activities;Cognitive remediation/compensation;Patient/family education;Balance training  AM-PAC OT "6 Clicks" Daily Activity Outcome Measure (Version 2)  Help from another person eating meals? 4  Help from another person taking care of personal grooming? 3  Help from another person toileting, which includes using toliet, bedpan, or urinal? 3  Help from another person bathing (including washing, rinsing, drying)? 2  Help from another person to put on and taking off regular upper body clothing? 3  Help from another person to put on and taking off regular lower body clothing? 2  6 Click Score 17  Progressive Mobility  What is the highest level of mobility based on the progressive mobility assessment? Level 4 (Walks with assist in room) - Balance while marching in place and cannot step forward and back - Complete  Mobility Out of bed for toileting;Out of bed to chair with meals;Ambulated with assistance in room  OT Recommendation  Follow Up Recommendations Home health OT  Assistance recommended at discharge Frequent or constant Supervision/Assistance  Functional Status Assessent Patient has had a recent decline in their functional status and demonstrates the ability to make significant improvements in function in a reasonable and predictable amount of time.  OT Equipment Tub/shower seat  Individuals Consulted  Consulted and Agree with Results and Recommendations Patient;Family  member/caregiver  Acute Rehab OT Goals  Patient Stated Goal none stated  OT Goal Formulation With patient/family  Time For Goal Achievement 11/24/21  Potential to Achieve Goals Good  OT Time Calculation  OT Start Time (ACUTE ONLY) 1335  OT Stop Time (ACUTE ONLY) 1352  OT Time Calculation (min) 17 min  OT General Charges  $OT Visit 1 Visit  OT Evaluation  $OT Eval Low Complexity 1 Low   Kalanie Fewell C, OT/L  Acute Rehab [phone]

## 2021-11-10 NOTE — Evaluation (Signed)
Physical Therapy Evaluation Patient Details Name: [name] MRN: [id number] DOB: 1946/04/17 Today's Date: 11/10/2021  History of Present Illness  75 y.o. female presents to [facility] hospital on 11/08/2021 with SOB and peripheral edema, newly diagnosed with afib with RVR and acute heart failure with preserved EF. PMH includes CKDIII, HTN, HLD.  Clinical Impression  Pt presents to PT with deficits in functional mobility, gait, balance, strength, power, endurance, DME use. Pt currently benefits from physical assistance for all transfers and out of bed mobility. Utilization of a walker aided in improved transfer quality, however pt demonstrates difficulty managing walker for gait. Pt will benefit from continued acute PT services to improve LE strength and to aide in a return to independence in household mobility. Pt will benefit from physical assistance for all out of bed mobility upon return home.     Recommendations for follow up therapy are one component of a multi-disciplinary discharge planning process, led by the attending physician.  Recommendations may be updated based on patient status, additional functional criteria and insurance authorization.  Follow Up Recommendations Home health PT    Assistance Recommended at Discharge Frequent or constant Supervision/Assistance  Functional Status Assessment Patient has had a recent decline in their functional status and demonstrates the ability to make significant improvements in function in a reasonable and predictable amount of time.  Equipment Recommendations   (family has ordered Rollator)    Recommendations for Other Services       Precautions / Restrictions Precautions Precautions: Fall Restrictions Weight Bearing Restrictions: No      Mobility  Bed Mobility Overal bed mobility: Needs Assistance Bed Mobility: Supine to Sit;Sit to Supine     Supine to sit: Supervision Sit to supine: Supervision        Transfers Overall  transfer level: Needs assistance Equipment used: Rolling walker (2 wheels);1 person hand held assist Transfers: Sit to/from Stand Sit to Stand: Min assist;Min guard           General transfer comment: minG with verbal cues when utilizing RW, minA with hand hold without walker    Ambulation/Gait Ambulation/Gait assistance: Min assist Gait Distance (Feet): 25 Feet (x 2 trials) Assistive device: Rolling walker (2 wheels);1 person hand held assist Gait Pattern/deviations: Step-to pattern;Wide base of support;Shuffle Gait velocity: reduced Gait velocity interpretation: <1.8 ft/sec, indicate of risk for recurrent falls   General Gait Details: pt with short shuffling steps with use of walker, often maintaining RW 1-2 inches from body and requiring cues to increase distance from body and walker to allow for increased step length. With hand hold pt demonstrates increased lateral sway, but is able to ambulate with improved step length and gait speed  Stairs            Wheelchair Mobility    Modified Rankin (Stroke Patients Only)       Balance Overall balance assessment: Needs assistance Sitting-balance support: No upper extremity supported;Feet supported Sitting balance-Leahy Scale: Fair     Standing balance support: Single extremity supported Standing balance-Leahy Scale: Poor Standing balance comment: minG-minA with UE support of walker or hand hold                             Pertinent Vitals/Pain Pain Assessment: No/denies pain    Home Living Family/patient expects to be discharged to:: Private residence Living Arrangements: Children Available Help at Discharge: Family;Available 24 hours/day Type of Home: House Home Access: Stairs to enter Entrance  Stairs-Rails: None Entrance Stairs-Number of Steps: 1   Home Layout: Two level;Able to live on main level with bedroom/bathroom Home Equipment: Toilet riser (daughter reports a rollator should be delivered  to house very soon)      Prior Function Prior Level of Function : Independent/Modified Independent             Mobility Comments: pt was able to ambulate household distances independently at baseline. Has required assistance for transfers and ambulation over the past week.       Hand Dominance        Extremity/Trunk Assessment   Upper Extremity Assessment Upper Extremity Assessment: Overall WFL for tasks assessed    Lower Extremity Assessment Lower Extremity Assessment: Generalized weakness    Cervical / Trunk Assessment Cervical / Trunk Assessment: Other exceptions Cervical / Trunk Exceptions: obesity  Communication   Communication: No difficulties  Cognition Arousal/Alertness: Awake/alert Behavior During Therapy: WFL for tasks assessed/performed Overall Cognitive Status: Impaired/Different from baseline Area of Impairment: Problem solving;Safety/judgement;Awareness                         Safety/Judgement: Decreased awareness of safety;Decreased awareness of deficits Awareness: Emergent Problem Solving: Slow processing          General Comments General comments (skin integrity, edema, etc.): pt tachy into low 100s, SpO2 stable    Exercises     Assessment/Plan    PT Assessment Patient needs continued PT services  PT Problem List Decreased strength;Decreased activity tolerance;Decreased balance;Decreased mobility;Decreased cognition;Decreased knowledge of use of DME;Decreased safety awareness;Cardiopulmonary status limiting activity       PT Treatment Interventions DME instruction;Gait training;Stair training;Functional mobility training;Therapeutic activities;Therapeutic exercise;Balance training;Neuromuscular re-education;Patient/family education;Cognitive remediation    PT Goals (Current goals can be found in the Care Plan section)  Acute Rehab PT Goals Patient Stated Goal: to return to independent ambulation PT Goal Formulation: With  family Time For Goal Achievement: 11/24/21 Potential to Achieve Goals: Good    Frequency Min 3X/week   Barriers to discharge        Co-evaluation               AM-PAC PT "6 Clicks" Mobility  Outcome Measure Help needed turning from your back to your side while in a flat bed without using bedrails?: A Little Help needed moving from lying on your back to sitting on the side of a flat bed without using bedrails?: A Little Help needed moving to and from a bed to a chair (including a wheelchair)?: A Little Help needed standing up from a chair using your arms (e.g., wheelchair or bedside chair)?: A Little Help needed to walk in hospital room?: A Little Help needed climbing 3-5 steps with a railing? : A Lot 6 Click Score: 17    End of Session   Activity Tolerance: Patient tolerated treatment well Patient left: in bed;with call bell/phone within reach;with family/visitor present Nurse Communication: Mobility status PT Visit Diagnosis: Other abnormalities of gait and mobility (R26.89);Muscle weakness (generalized) (M62.81)    Time: 0539-7673 PT Time Calculation (min) (ACUTE ONLY): 29 min   Charges:   PT Evaluation $PT Eval Low Complexity: 1 Low PT Treatments $Gait Training: 8-22 mins        Zenaida Niece, PT, DPT Acute Rehabilitation Pager: [phone] Office [place] 11/10/2021, 12:37 PM

## 2021-11-11 ENCOUNTER — Other Ambulatory Visit (HOSPITAL_COMMUNITY): Payer: Self-pay

## 2021-11-11 LAB — BASIC METABOLIC PANEL
Anion gap: 7 (ref 5–15)
BUN: 21 mg/dL (ref 8–23)
CO2: 30 mmol/L (ref 22–32)
Calcium: 8.9 mg/dL (ref 8.9–10.3)
Chloride: 101 mmol/L (ref 98–111)
Creatinine, Ser: 1.49 mg/dL — ABNORMAL HIGH (ref 0.44–1.00)
GFR, Estimated: 36 mL/min — ABNORMAL LOW (ref >60)
Glucose, Bld: 92 mg/dL (ref 70–99)
Potassium: 4.1 mmol/L (ref 3.5–5.1)
Sodium: 138 mmol/L (ref 135–145)

## 2021-11-11 MED ORDER — APIXABAN 5 MG PO TABS
5.0000 mg | ORAL_TABLET | Freq: Two times a day (BID) | ORAL | 1 refills | Status: DC
  Filled 2021-11-11: qty 60, 30d supply, fill #0

## 2021-11-11 MED ORDER — FUROSEMIDE 20 MG PO TABS
20.0000 mg | ORAL_TABLET | Freq: Every day | ORAL | 1 refills | Status: DC
  Filled 2021-11-11: qty 30, 30d supply, fill #0

## 2021-11-11 MED ORDER — LOSARTAN POTASSIUM 50 MG PO TABS
25.0000 mg | ORAL_TABLET | Freq: Every day | ORAL | 1 refills | Status: DC
  Filled 2021-11-11: qty 30, 60d supply, fill #0

## 2021-11-11 MED ORDER — METOPROLOL TARTRATE 25 MG PO TABS
12.5000 mg | ORAL_TABLET | Freq: Two times a day (BID) | ORAL | 0 refills | Status: DC
  Filled 2021-11-11: qty 60, 60d supply, fill #0

## 2021-11-11 NOTE — Plan of Care (Signed)
DISCHARGE NOTE HOME [name] to be discharged home per MD order. Discussed prescriptions and follow up appointments with the patient. Prescriptions given to patient; medication list explained in detail. Patient verbalized understanding.  Skin clean, dry and intact without evidence of skin break down, no evidence of skin tears noted. IV catheter discontinued intact. Site without signs and symptoms of complications. Dressing and pressure applied. Pt denies pain at the site currently. No complaints noted.  Patient free of lines, drains, and wounds.   An After Visit Summary (AVS) was printed and given to the patient. Patient escorted via wheelchair, and discharged home via private auto.  Stephan Minister, RN

## 2021-11-11 NOTE — TOC Benefit Eligibility Note (Signed)
Findings of benefits investigation via test claims at [facility]:   Apixaban 5mg  #60/30 days returns as a $141.59 co-pay  **Patient has a $566.34 deductible that is unmet and apply to all claims.

## 2021-11-11 NOTE — Discharge Summary (Signed)
Physician Discharge Summary   Patient name: [name]  Admit date:     11/08/2021  Discharge date: 11/11/2021  Discharge Physician: Geradine Girt   PCP: Sandrea Hughs, NP   Recommendations at discharge: referral to a fib clinic, BMP 1 week as she was started on lasix  Discharge Diagnoses Principal Problem:   Acute heart failure with preserved ejection fraction ([place]) Active Problems:   AF (paroxysmal atrial fibrillation) ([place])   Vascular dementia, uncomplicated (HCC)   Benign hypertension with CKD (chronic kidney disease) stage III (HCC)   CKD (chronic kidney disease) stage 3, GFR 30-59 ml/min (HCC)   Hyperlipidemia   BMI 40.0-44.9, adult [facility])    Hospital Course      * Acute heart failure with preserved ejection fraction ([place]) -New diagnosis, in the setting of 3 days new onset shortness of breath associated with new onset peripheral edema, elevated BNP, tachypnea on exam, and chest x-ray showing borderline cardiomegaly with vascular congestion, and mild pulmonary edema.    - strict I's & O's and daily weights- weight down  -Echocardiogram: Left ventricular ejection fraction, by estimation, is 65 to 70%. The  left ventricle has normal function. The left ventricle has no regional  wall motion abnormalities. Left ventricular diastolic parameters are  indeterminate. Elevated left ventricular  end-diastolic pressure.    AF (paroxysmal atrial fibrillation) (HCC) Appears to represent a new diagnosis, requiring further evaluation, with finding of atrial fibrillation with RVR as an outpatient.    -referral to a fib clinic at d/c - CHA2DS2-VASc score of 7, -low dose BB - TSH value within normal limits. - strict I's & O's and daily weights. - Continue Eliquis.     CKD (chronic kidney disease) stage 3, GFR 30-59 ml/min (HCC) -baseline creatinine 1.3-1.4, presenting labs reflecting creatinine consistent with this range.  Close monitoring of ensuing renal function,  outpatient BMP  Benign hypertension with CKD (chronic kidney disease) stage III (HCC) -resume losartan -hold norvasc -Continue hydralazine.   -continue diuretics -add BB  Vascular dementia, uncomplicated (HCC) stable  BMI 40.0-44.9, adult (HCC) Estimated body mass index is 41.68 kg/m as calculated from the following:   Height as of an earlier encounter on 11/08/21: 5\' 5"  (1.651 m).   Weight as of this encounter: 113.6 kg.  Hyperlipidemia -on simvastatin       Condition at discharge: stable  Exam In bed, NAD rrr  Disposition: Home  Discharge time: greater than 30 minutes.  Follow-up Information     Ngetich, Dinah C, NP. Go on 11/14/2021.   Specialty: Family Medicine Why: BMP 1 week @3 :15pm Contact information: [place] [address] [phone]         [facility], [place] Follow up.   Specialty: [place] Why: HHPT Contact information: [place] [address] [phone]         Malka So R, [place] Follow up on 11/13/2021.   Specialty: Cardiology Why: 3:30 come 15 mins early, please pick up samples of eliquis at this apt, Contact information: [place] [address] [phone]                 Allergies as of 11/11/2021   No Known Allergies      Medication List     STOP taking these medications    amLODipine 10 MG tablet Commonly known as: NORVASC   aspirin EC 81 MG tablet  TAKE these medications    brimonidine 0.2 % ophthalmic solution Commonly known as: ALPHAGAN Place 1 drop into the right eye 4 (four) times daily.   cholecalciferol 1000 units tablet Commonly known as: VITAMIN D Take 1,000 Units by mouth daily.   Eliquis 5 MG Tabs tablet Generic drug: apixaban Take 1 tablet (5 mg total) by mouth 2 (two) times daily.   furosemide 20 MG tablet Commonly known as: Lasix Take 1 tablet (20 mg total) by mouth daily.   hydrALAZINE 25 MG tablet Commonly  known as: APRESOLINE TAKE 1 TABLET THREE TIMES DAILY WITH MEALS TO CONTROL BLOOD PRESSURE What changed:  how much to take how to take this when to take this additional instructions   losartan 50 MG tablet Commonly known as: COZAAR Take 1/2 tablet (25 mg total) by mouth daily. What changed: See the new instructions.   metoprolol tartrate 25 MG tablet Commonly known as: LOPRESSOR Take 1/2 tablet (12.5 mg total) by mouth 2 (two) times daily.   multivitamin tablet Take 1 tablet by mouth daily.   Namzaric 28-10 MG Cp24 Generic drug: Memantine HCl-Donepezil HCl TAKE 1 CAPSULE  DAILY TO PRESERVE MEMORY What changed: See the new instructions.   oxybutynin 5 MG tablet Commonly known as: DITROPAN TAKE 1 TABLET ONE TIME DAILY FOR BLADDER CONTROL What changed:  how much to take how to take this when to take this additional instructions   simvastatin 40 MG tablet Commonly known as: ZOCOR TAKE 1 TABLET EVERY DAY AT 6PM What changed:  how much to take how to take this when to take this additional instructions        DG Chest 2 View  Result Date: 11/08/2021 CLINICAL DATA:  Shortness of breath, irregular heartbeat EXAM: CHEST - 2 VIEW COMPARISON:  Chest radiograph 03/08/2014 FINDINGS: The heart is at the upper limits of normal for size. There are patchy opacities in the right lung base. There is vascular congestion with possible mild pulmonary interstitial edema. There is no pleural effusion or pneumothorax. There is no acute osseous abnormality. IMPRESSION: 1. Borderline cardiomegaly with vascular congestion and possible mild pulmonary interstitial edema. 2. Patchy opacities in the right lung base may reflect atelectasis or infection. Electronically Signed   By: Valetta Mole M.D.   On: 11/08/2021 13:01   ECHOCARDIOGRAM COMPLETE  Result Date: 11/09/2021    ECHOCARDIOGRAM REPORT   Patient Name:   [name] Date of Exam: 11/09/2021 Medical Rec #:  [id number]     Height:       65.0  in Accession #:    [id number]    Weight:       254.0 lb Date of Birth:  09/11/1946     BSA:          2.190 m Patient Age:    75 years      BP:           103/90 mmHg Patient Gender: F             HR:           75 bpm. Exam Location:  Inpatient Procedure: 2D Echo, Cardiac Doppler and Color Doppler Indications:    CHF  History:        Patient has no prior history of Echocardiogram examinations.                 Arrythmias:Atrial Fibrillation; Signs/Symptoms:Shortness of  Breath.  Sonographer:    Jyl Heinz Referring Phys: [id number] [place]  1. Left ventricular ejection fraction, by estimation, is 65 to 70%. The left ventricle has normal function. The left ventricle has no regional wall motion abnormalities. Left ventricular diastolic parameters are indeterminate. Elevated left ventricular end-diastolic pressure.  2. Right ventricular systolic function is normal. The right ventricular size is normal.  3. The mitral valve is normal in structure. No evidence of mitral valve regurgitation. No evidence of mitral stenosis.  4. The aortic valve is normal in structure. Aortic valve regurgitation is not visualized. No aortic stenosis is present. FINDINGS  Left Ventricle: Left ventricular ejection fraction, by estimation, is 65 to 70%. The left ventricle has normal function. The left ventricle has no regional wall motion abnormalities. The left ventricular internal cavity size was normal in size. There is  no left ventricular hypertrophy. Left ventricular diastolic parameters are indeterminate. Elevated left ventricular end-diastolic pressure. Right Ventricle: The right ventricular size is normal. Right vetricular wall thickness was not well visualized. Right ventricular systolic function is normal. Left Atrium: Left atrial size was normal in size. Right Atrium: Right atrial size was normal in size. Pericardium: There is no evidence of pericardial effusion. Mitral Valve: The mitral valve is  normal in structure. No evidence of mitral valve regurgitation. No evidence of mitral valve stenosis. Tricuspid Valve: The tricuspid valve is grossly normal. Tricuspid valve regurgitation is trivial. Aortic Valve: The aortic valve is normal in structure. Aortic valve regurgitation is not visualized. No aortic stenosis is present. Aortic valve peak gradient measures 12.1 mmHg. Pulmonic Valve: The pulmonic valve was normal in structure. Pulmonic valve regurgitation is trivial. Aorta: The aortic root and ascending aorta are structurally normal, with no evidence of dilitation. IAS/Shunts: The interatrial septum was not well visualized.  LEFT VENTRICLE PLAX 2D LVIDd:         4.60 cm     Diastology LVIDs:         3.40 cm     LV e' medial:    5.55 cm/s LV PW:         1.20 cm     LV E/e' medial:  17.1 LV IVS:        1.20 cm     LV e' lateral:   5.00 cm/s LVOT diam:     2.00 cm     LV E/e' lateral: 18.9 LV SV:         69 LV SV Index:   32 LVOT Area:     3.14 cm  LV Volumes (MOD) LV vol d, MOD A2C: 78.7 ml LV vol d, MOD A4C: 66.1 ml LV vol s, MOD A2C: 31.9 ml LV vol s, MOD A4C: 27.6 ml LV SV MOD A2C:     46.8 ml LV SV MOD A4C:     66.1 ml LV SV MOD BP:      43.2 ml RIGHT VENTRICLE            IVC RV Basal diam:  3.20 cm    IVC diam: 2.20 cm RV Mid diam:    2.80 cm RV S prime:     9.36 cm/s TAPSE (M-mode): 2.0 cm LEFT ATRIUM             Index        RIGHT ATRIUM           Index LA diam:        3.60 cm 1.64 cm/m  RA Area:     14.80 cm LA Vol (A2C):   46.4 ml 21.19 ml/m  RA Volume:   37.20 ml  16.99 ml/m LA Vol (A4C):   54.5 ml 24.89 ml/m LA Biplane Vol: 51.1 ml 23.34 ml/m  AORTIC VALVE AV Area (Vmax): 1.97 cm AV Vmax:        174.00 cm/s AV Peak Grad:   12.1 mmHg LVOT Vmax:      109.00 cm/s LVOT Vmean:     80.600 cm/s LVOT VTI:       0.220 m  AORTA Ao Root diam: 2.60 cm Ao Asc diam:  2.70 cm MITRAL VALVE MV Area (PHT): 2.53 cm     SHUNTS MV Decel Time: 300 msec     Systemic VTI:  0.22 m MV E velocity: 94.70 cm/s    Systemic Diam: 2.00 cm MV A velocity: 127.00 cm/s MV E/A ratio:  0.75 Mertie Moores MD Electronically signed by Mertie Moores MD Signature Date/Time: 11/09/2021/4:26:15 PM    Final    Results for orders placed or performed during the hospital encounter of 11/08/21  Resp Panel by RT-PCR (Flu A&B, Covid) Nasopharyngeal Swab     Status: None   Collection Time: 11/08/21  6:57 PM   Specimen: Nasopharyngeal Swab; Nasopharyngeal(NP) swabs in vial transport medium  Result Value Ref Range Status   SARS Coronavirus 2 by RT PCR NEGATIVE NEGATIVE Final    Comment: (NOTE) SARS-CoV-2 target nucleic acids are NOT DETECTED.  The SARS-CoV-2 RNA is generally detectable in upper respiratory specimens during the acute phase of infection. The lowest concentration of SARS-CoV-2 viral copies this assay can detect is 138 copies/mL. A negative result does not preclude SARS-Cov-2 infection and should not be used as the sole basis for treatment or other patient management decisions. A negative result may occur with  improper specimen collection/handling, submission of specimen other than nasopharyngeal swab, presence of viral mutation(s) within the areas targeted by this assay, and inadequate number of viral copies(<138 copies/mL). A negative result must be combined with clinical observations, patient history, and epidemiological information. The expected result is Negative.  Fact Sheet for Patients:  EntrepreneurPulse.com.au  Fact Sheet for Healthcare Providers:  IncredibleEmployment.be  This test is no t yet approved or cleared by the [place] FDA and  has been authorized for detection and/or diagnosis of SARS-CoV-2 by FDA under an Emergency Use Authorization (EUA). This EUA will remain  in effect (meaning this test can be used) for the duration of the COVID-19 declaration under Section 564(b)(1) of the Act, 21 U.S.C.section 360bbb-3(b)(1), unless the authorization is  terminated  or revoked sooner.       Influenza A by PCR NEGATIVE NEGATIVE Final   Influenza B by PCR NEGATIVE NEGATIVE Final    Comment: (NOTE) The Xpert Xpress SARS-CoV-2/FLU/RSV plus assay is intended as an aid in the diagnosis of influenza from Nasopharyngeal swab specimens and should not be used as a sole basis for treatment. Nasal washings and aspirates are unacceptable for Xpert Xpress SARS-CoV-2/FLU/RSV testing.  Fact Sheet for Patients: EntrepreneurPulse.com.au  Fact Sheet for Healthcare Providers: IncredibleEmployment.be  This test is not yet approved or cleared by the [place] FDA and has been authorized for detection and/or diagnosis of SARS-CoV-2 by FDA under an Emergency Use Authorization (EUA). This EUA will remain in effect (meaning this test can be used) for the duration of the COVID-19 declaration under Section 564(b)(1) of the Act, 21 U.S.C. section 360bbb-3(b)(1), unless the authorization is terminated or revoked.  Performed at [facility], [place] [address], [place], [place] [address]     Signed:  Geradine Girt DO [facility] 11/11/2021, 5:16 PM

## 2021-11-11 NOTE — Progress Notes (Signed)
Physical Therapy Treatment Patient Details Name: [name] MRN: [id number] DOB: March 02, 1946 Today's Date: 11/11/2021   History of Present Illness 75 y.o. female presents to [facility] hospital on 11/08/2021 with SOB and peripheral edema, newly diagnosed with afib with RVR and acute heart failure with preserved EF. PMH includes CKDIII, HTN, HLD.    PT Comments    Patient progressing slowly towards PT goals. Pt impulsive with all mobility with forward momentum putting pt at increased risk for falls. Requires Min A-Min guard for gait training with use of rail in hallway as well as any furniture for support, which daughter reports she does at home. Daughter purchased a rollator waiting for her at home. Recommend waiting to use it until HHPT can provide instruction. VSS on RA. Recommend hands on assist for all mobility. WIll follow.   Recommendations for follow up therapy are one component of a multi-disciplinary discharge planning process, led by the attending physician.  Recommendations may be updated based on patient status, additional functional criteria and insurance authorization.  Follow Up Recommendations  Home health PT     Assistance Recommended at Discharge Frequent or constant Supervision/Assistance  Equipment Recommendations  None recommended by PT    Recommendations for Other Services       Precautions / Restrictions Precautions Precautions: Fall Restrictions Weight Bearing Restrictions: No     Mobility  Bed Mobility Overal bed mobility: Needs Assistance Bed Mobility: Supine to Sit     Supine to sit: Supervision;HOB elevated     General bed mobility comments: Supervision for safety. Use of rail.    Transfers Overall transfer level: Needs assistance Equipment used: 2 person hand held assist Transfers: Sit to/from Stand;Bed to chair/wheelchair/BSC Sit to Stand: Min assist;+2 physical assistance Stand pivot transfers: Min guard         General transfer comment:  ASsist of 2 to power to standing with use of momentum. Stood from [employer], on second attempt Min A for balance once upright, SPT bed to chair reaching for arm rest for support.    Ambulation/Gait Ambulation/Gait assistance: Min assist Gait Distance (Feet): 40 Feet Assistive device: 1 person hand held assist Gait Pattern/deviations: Step-through pattern;Trunk flexed;Wide base of support Gait velocity: increased speed, unsafe at times Gait velocity interpretation: 1.31 - 2.62 ft/sec, indicative of limited community ambulator   General Gait Details: Pt with forward momentum and large step lengths reaching for furniture for support; requires Min A for balance/support, reaching for rail at times and HHA.   Stairs             Wheelchair Mobility    Modified Rankin (Stroke Patients Only)       Balance Overall balance assessment: Needs assistance Sitting-balance support: No upper extremity supported;Feet supported Sitting balance-Leahy Scale: Fair     Standing balance support: During functional activity Standing balance-Leahy Scale: Poor Standing balance comment: minG-minA with UE support                            Cognition Arousal/Alertness: Awake/alert Behavior During Therapy: WFL for tasks assessed/performed;Impulsive Overall Cognitive Status: Impaired/Different from baseline Area of Impairment: Awareness;Problem solving                         Safety/Judgement: Decreased awareness of safety;Decreased awareness of deficits Awareness: Emergent Problem Solving: Slow processing General Comments: Impulsive with mobility; "now what you want me to do, now what?" Poor awareness of safety/  Exercises      General Comments General comments (skin integrity, edema, etc.): Daughter present during session and reports she will assist at home. Bought rollator and will have pt start using once HHPT begins and can teach her.      Pertinent  Vitals/Pain Pain Assessment: No/denies pain    Home Living                          Prior Function            PT Goals (current goals can now be found in the care plan section) Progress towards PT goals: Progressing toward goals    Frequency    Min 3X/week      PT Plan Current plan remains appropriate    Co-evaluation              AM-PAC PT "6 Clicks" Mobility   Outcome Measure  Help needed turning from your back to your side while in a flat bed without using bedrails?: A Little Help needed moving from lying on your back to sitting on the side of a flat bed without using bedrails?: A Little Help needed moving to and from a bed to a chair (including a wheelchair)?: A Lot Help needed standing up from a chair using your arms (e.g., wheelchair or bedside chair)?: A Lot Help needed to walk in hospital room?: A Little Help needed climbing 3-5 steps with a railing? : A Lot 6 Click Score: 15    End of Session Equipment Utilized During Treatment: Gait belt Activity Tolerance: Patient tolerated treatment well Patient left: in chair;with call bell/phone within reach;with chair alarm set;with family/visitor present Nurse Communication: Mobility status PT Visit Diagnosis: Other abnormalities of gait and mobility (R26.89);Muscle weakness (generalized) (M62.81)     Time: 1335-1350 PT Time Calculation (min) (ACUTE ONLY): 15 min  Charges:  $Gait Training: 8-22 mins                     Marisa Severin, PT, DPT Acute Rehabilitation Services Pager [phone] Office [place] 11/11/2021, 2:04 PM

## 2021-11-11 NOTE — TOC Transition Note (Addendum)
Transition of Care [facility]) - CM/SW Discharge Note   Patient Details  Name: [name] MRN: [id number] Date of Birth: 09-26-1946  [facility] CM/SW Contact:  Zenon Mayo, RN Phone Number: 11/11/2021, 3:40 PM   Clinical Narrative:    NCM spoke with patient daughter who was in the room, offered choice for HHPT, she states they do not have a preference, NCM made referral to [name] with [facility].  She is able to take referral.  Soc will begin 24 to 48 hrs post dc. NCM called heart care across the st to see if can get some samples for patient since copay is so high that may be able to help her thru end of the year. Left phone number for return call. NCM made her a follow up apt at the afib clinic to see Dr. Malka So on 12/7 at 3:30, they will also give her some eliquis samples. NCM left vm on daughter phone with this informatiaon.   Final next level of care: [place] Barriers to Discharge: No Barriers Identified   Patient Goals and CMS Choice Patient states their goals for this hospitalization and ongoing recovery are:: return home with [facility] CMS Medicare.gov Compare Post Acute Care list provided to:: Patient Represenative (must comment) Choice offered to / list presented to : Adult Children  Discharge Placement                       Discharge Plan and Services                  DME Agency: NA       HH Arranged: PT HH Agency: [place] Date HH Agency Contacted: 11/11/21 Time [place]: [id number] Representative spoke with at [place]: [place] Determinants of Health ([place]) Interventions     Readmission Risk Interventions No flowsheet data found.

## 2021-11-11 NOTE — Assessment & Plan Note (Signed)
stable °

## 2021-11-11 NOTE — Plan of Care (Signed)
?  Problem: Education: ?Goal: Ability to demonstrate management of disease process will improve ?Outcome: Progressing ?  ?

## 2021-11-12 ENCOUNTER — Telehealth: Payer: Self-pay | Admitting: *Deleted

## 2021-11-12 NOTE — Telephone Encounter (Signed)
Transition Care Management Follow-up Telephone Call Date of discharge and from where: 11/11/2021 [place] How have you been since you were released from the hospital? Well, much better Any questions or concerns? No  Items Reviewed: Did the pt receive and understand the discharge instructions provided? Yes  Medications obtained and verified? Yes  Other? No  Any new allergies since your discharge? No  Dietary orders reviewed? Yes Do you have support at home? Yes Daughter, [facility] and Equipment/Supplies: Were home health services ordered? yes If so, what is the name of the agency? [facility]  Has the agency set up a time to come to the patient's home? no Were any new equipment or medical supplies ordered?  No What is the name of the medical supply agency? [facility] Were you able to get the supplies/equipment? not applicable Do you have any questions related to the use of the equipment or supplies? No  Functional Questionnaire: (I = Independent and D = Dependent) ADLs: I with Assistance Rollator  Bathing/Dressing- I  Meal Prep- D  Eating- I  Maintaining continence- I  Transferring/Ambulation- I with assistance   Managing Meds- I  Follow up appointments reviewed:  PCP Hospital f/u appt confirmed? Yes  Scheduled to see Dinah on 11/14/2021 @ 3:15. [facility] f/u appt confirmed? Yes  Scheduled to see AFIB Clinic Are transportation arrangements needed? No  If their condition worsens, is the pt aware to call PCP or go to the Emergency Dept.? Yes Was the patient provided with contact information for the PCP's office or ED? Yes Was to pt encouraged to call back with questions or concerns? Yes

## 2021-11-13 ENCOUNTER — Ambulatory Visit (HOSPITAL_COMMUNITY)
Admission: RE | Admit: 2021-11-13 | Discharge: 2021-11-13 | Disposition: A | Discharge status: Home / Self Care | Payer: Medicare HMO | Source: Ambulatory Visit | Attending: Physician Assistant | Admitting: Physician Assistant

## 2021-11-13 ENCOUNTER — Encounter (HOSPITAL_COMMUNITY): Payer: Self-pay | Admitting: Physician Assistant

## 2021-11-13 ENCOUNTER — Other Ambulatory Visit: Payer: Self-pay

## 2021-11-13 VITALS — BP 142/80 | HR 62 | Ht 65.0 in | Wt 246.8 lb

## 2021-11-13 DIAGNOSIS — D6869 Other thrombophilia: Secondary | ICD-10-CM | POA: Diagnosis not present

## 2021-11-13 DIAGNOSIS — I130 Hypertensive heart and chronic kidney disease with heart failure and stage 1 through stage 4 chronic kidney disease, or unspecified chronic kidney disease: Secondary | ICD-10-CM | POA: Insufficient documentation

## 2021-11-13 DIAGNOSIS — E669 Obesity, unspecified: Secondary | ICD-10-CM | POA: Diagnosis not present

## 2021-11-13 DIAGNOSIS — I480 Paroxysmal atrial fibrillation: Secondary | ICD-10-CM | POA: Diagnosis not present

## 2021-11-13 DIAGNOSIS — N189 Chronic kidney disease, unspecified: Secondary | ICD-10-CM | POA: Diagnosis not present

## 2021-11-13 DIAGNOSIS — I5030 Unspecified diastolic (congestive) heart failure: Secondary | ICD-10-CM | POA: Diagnosis not present

## 2021-11-13 DIAGNOSIS — R0683 Snoring: Secondary | ICD-10-CM | POA: Diagnosis not present

## 2021-11-13 DIAGNOSIS — I4892 Unspecified atrial flutter: Secondary | ICD-10-CM | POA: Insufficient documentation

## 2021-11-13 DIAGNOSIS — Z6841 Body Mass Index (BMI) 40.0 and over, adult: Secondary | ICD-10-CM | POA: Diagnosis not present

## 2021-11-13 DIAGNOSIS — Z7901 Long term (current) use of anticoagulants: Secondary | ICD-10-CM | POA: Insufficient documentation

## 2021-11-13 MED ORDER — APIXABAN 5 MG PO TABS: 5.0000 mg | ORAL_TABLET | Freq: Two times a day (BID) | ORAL | 3 refills | Status: DC

## 2021-11-13 NOTE — Progress Notes (Signed)
Primary Care Physician: Ngetich, Nelda Bucks, NP Primary Cardiologist: none Primary Electrophysiologist: none Referring Physician: Dr Noralee Stain [name] is a 75 y.o. female with a history of CKD, HTN, HLD, diastolic CHF, prior CVA, atrial flutter, and atrial fibrillation who presents for consultation in the [facility].  The patient was initially diagnosed with atrial flutter 11/05/21 during cataract surgery. However, her family reports that she had symptoms of SOB and lower extremity edema prior to surgery. She was rate controlled at that time and was asked to follow up with PCP. She saw her PCP on 11/08/21 and was in rapid atrial flutter and was sent to the ED. She converted to SR spontaneously in the ED. Patient was started on Eliquis for a CHADS2VASC score of 7 and metoprolol for rate control. She was also in acute diastolic CHF and was diuresed. Echo showed EF 65-70%. Today, patient reports that she feels well. She is in SR. She does report snoring and witnessed apnea.   Today, she denies symptoms of palpitations, chest pain, shortness of breath, orthopnea, PND, lower extremity edema, dizziness, presyncope, syncope, bleeding, or neurologic sequela. The patient is tolerating medications without difficulties and is otherwise without complaint today.    Atrial Fibrillation Risk Factors:  she does have symptoms or diagnosis of sleep apnea. she is agreeable for sleep evaluation.  she does not have a history of rheumatic fever. she does not have a history of alcohol use. The patient does have a history of early familial atrial fibrillation or other arrhythmias. Sister had PPM.  she has a BMI of Body mass index is 41.07 kg/m.Marland Kitchen Filed Weights   11/13/21 1522  Weight: 111.9 kg    Family History  Problem Relation Age of Onset   Pneumonia Mother    Kidney disease Sister    Hypertension Brother    Colon cancer Neg Hx    Colon polyps Neg Hx    Esophageal cancer  Neg Hx    Rectal cancer Neg Hx    Stomach cancer Neg Hx      Atrial Fibrillation Management history:  Previous antiarrhythmic drugs: none Previous cardioversions: none Previous ablations: none CHADS2VASC score: 7 Anticoagulation history: Eliquis   Past Medical History:  Diagnosis Date   Cataract    slow growing    Chronic kidney disease    Hyperlipidemia    Hypertension    Insomnia, unspecified    Memory loss    mild dementia per daughter    Osteopenia    Other abnormal blood chemistry    Other specified cardiac dysrhythmias(427.89)    Other specified disease of nail    Stroke ([place])    2006 or 2007 per daughter    Syncope and collapse    Unspecified disorder of kidney and ureter    Unspecified late effects of cerebrovascular disease    Unspecified transient cerebral ischemia    Unspecified urinary incontinence    Unspecified vitamin D deficiency    Vascular dementia, uncomplicated (HCC)    Past Surgical History:  Procedure Laterality Date   NO PAST SURGERIES      Current Outpatient Medications  Medication Sig Dispense Refill   brimonidine (ALPHAGAN) 0.2 % ophthalmic solution Place 1 drop into the right eye 4 (four) times daily.     cholecalciferol (VITAMIN D) 1000 units tablet Take 1,000 Units by mouth daily.     furosemide (LASIX) 20 MG tablet Take 1 tablet (20 mg total) by mouth daily.  30 tablet 1   hydrALAZINE (APRESOLINE) 25 MG tablet TAKE 1 TABLET THREE TIMES DAILY WITH MEALS TO CONTROL BLOOD PRESSURE 270 tablet 1   losartan (COZAAR) 50 MG tablet Take 1/2 tablet (25 mg total) by mouth daily. 30 tablet 1   metoprolol tartrate (LOPRESSOR) 25 MG tablet Take 1/2 tablet (12.5 mg total) by mouth 2 (two) times daily. 60 tablet 0   Multiple Vitamin (MULTIVITAMIN) tablet Take 1 tablet by mouth daily.     NAMZARIC 28-10 MG CP24 TAKE 1 CAPSULE  DAILY TO PRESERVE MEMORY 90 capsule 1   oxybutynin (DITROPAN) 5 MG tablet TAKE 1 TABLET ONE TIME DAILY FOR BLADDER CONTROL 90  tablet 1   simvastatin (ZOCOR) 40 MG tablet TAKE 1 TABLET EVERY DAY AT 6PM 90 tablet 1   apixaban (ELIQUIS) 5 MG TABS tablet Take 1 tablet (5 mg total) by mouth 2 (two) times daily. 90 tablet 3   No current facility-administered medications for this encounter.    No Known Allergies  Social History   Socioeconomic History   Marital status: Single    Spouse name: Not on file   Number of children: Not on file   Years of education: Not on file   Highest education level: Not on file  Occupational History   Not on file  Tobacco Use   Smoking status: Former    Years: 30.00    Types: Cigarettes   Smokeless tobacco: Never   Tobacco comments:    Quit about 7-8 years ago as of 2015   Substance and Sexual Activity   Alcohol use: No   Drug use: No   Sexual activity: Never  Other Topics Concern   Not on file  Social History Narrative   Not on file   Social Determinants of Health   Financial Resource Strain: Not on file  Food Insecurity: Not on file  Transportation Needs: Not on file  Physical Activity: Not on file  Stress: Not on file  Social Connections: Not on file  Intimate Partner Violence: Not on file     ROS- All systems are reviewed and negative except as per the HPI above.  Physical Exam: Vitals:   11/13/21 1522  BP: (!) 142/80  Pulse: 62  Weight: 111.9 kg  Height: 5\' 5"  (1.651 m)    GEN- The patient is a well appearing elderly obese female, alert and oriented x 3 today.   Head- normocephalic, atraumatic Eyes-  Sclera clear, conjunctiva pink Ears- hearing intact Oropharynx- clear Neck- supple  Lungs- Clear to ausculation bilaterally, normal work of breathing Heart- Regular rate and rhythm, no murmurs, rubs or gallops  GI- soft, NT, ND, + BS Extremities- no clubbing, cyanosis, or edema MS- no significant deformity or atrophy Skin- no rash or lesion Psych- euthymic mood, full affect Neuro- strength and sensation are intact  Wt Readings from Last 3  Encounters:  11/13/21 111.9 kg  11/11/21 113.6 kg  11/08/21 118.8 kg    EKG today demonstrates  SR Vent. rate 62 BPM PR interval 188 ms QRS duration 82 ms QT/QTcB 440/446 ms  Echo 11/09/21 demonstrated   1. Left ventricular ejection fraction, by estimation, is 65 to 70%. The  left ventricle has normal function. The left ventricle has no regional  wall motion abnormalities. Left ventricular diastolic parameters are  indeterminate. Elevated left ventricular end-diastolic pressure.   2. Right ventricular systolic function is normal. The right ventricular  size is normal.   3. The mitral valve is normal in  structure. No evidence of mitral valve  regurgitation. No evidence of mitral stenosis.   4. The aortic valve is normal in structure. Aortic valve regurgitation is  not visualized. No aortic stenosis is present.   Epic records are reviewed at length today  CHA2DS2-VASc Score = 7  The patient's score is based upon: CHF History: 1 HTN History: 1 Diabetes History: 0 Stroke History: 2 Vascular Disease History: 0 Age Score: 2 Gender Score: 1      ASSESSMENT AND PLAN: 1. Atypical atrial flutter/atrial fibrillation  The patient's CHA2DS2-VASc score is 7, indicating a 11.2% annual risk of stroke.   General education about afib provided and questions answered. We also discussed her stroke risk and the risks and benefits of anticoagulation. Continue Eliquis 5 mg BID Continue Lopressor 12.5 mg BID  2. Secondary Hypercoagulable State (ICD10:  D68.69) The patient is at significant risk for stroke/thromboembolism based upon her CHA2DS2-VASc Score of 7.  Continue Apixaban (Eliquis).   3. Obesity Body mass index is 41.07 kg/m. Lifestyle modification was discussed at length including regular exercise and weight reduction.  4. Snoring/witnessed apnea The importance of adequate treatment of sleep apnea was discussed today in order to improve our ability to maintain sinus rhythm long  term. Will refer for sleep study.   5. HFpEF Patient hospitalized for acute diastolic CHF.  Per family, her symptoms were NYHA class III BNP on 12/2 was 408.2 Patient and family interested in Alleviated HF trial, will send for chart review.  Weight stable, no signs or symptoms of fluid overload today.    Will have her establish care with a primary cardiologist. Will also refer to see if she is a candidate for Alleviate HF.    [facility] [address] [place], [place] [address] [phone] 11/13/2021 3:47 PM

## 2021-11-14 ENCOUNTER — Encounter: Payer: Self-pay | Admitting: Family

## 2021-11-14 ENCOUNTER — Ambulatory Visit (INDEPENDENT_AMBULATORY_CARE_PROVIDER_SITE_OTHER): Payer: Medicare HMO | Admitting: Family

## 2021-11-14 ENCOUNTER — Telehealth: Payer: Self-pay | Admitting: *Deleted

## 2021-11-14 VITALS — BP 128/80 | HR 63 | Temp 96.2°F | Resp 18 | Ht 65.0 in | Wt 248.6 lb

## 2021-11-14 DIAGNOSIS — I480 Paroxysmal atrial fibrillation: Secondary | ICD-10-CM | POA: Diagnosis not present

## 2021-11-14 DIAGNOSIS — N183 Chronic kidney disease, stage 3 unspecified: Secondary | ICD-10-CM | POA: Diagnosis not present

## 2021-11-14 DIAGNOSIS — F0150 Vascular dementia without behavioral disturbance: Secondary | ICD-10-CM

## 2021-11-14 DIAGNOSIS — I129 Hypertensive chronic kidney disease with stage 1 through stage 4 chronic kidney disease, or unspecified chronic kidney disease: Secondary | ICD-10-CM

## 2021-11-14 DIAGNOSIS — R600 Localized edema: Secondary | ICD-10-CM

## 2021-11-14 MED ORDER — FUROSEMIDE 20 MG PO TABS: 20.0000 mg | ORAL_TABLET | Freq: Every day | ORAL | 0 refills | Status: DC

## 2021-11-14 MED ORDER — METOPROLOL TARTRATE 25 MG PO TABS: 12.5000 mg | ORAL_TABLET | Freq: Two times a day (BID) | ORAL | 0 refills | Status: DC

## 2021-11-14 MED ORDER — POTASSIUM CHLORIDE CRYS ER 20 MEQ PO TBCR: 20.0000 meq | EXTENDED_RELEASE_TABLET | Freq: Every day | ORAL | 3 refills | Status: DC

## 2021-11-14 NOTE — Telephone Encounter (Signed)
Patient's daughter notified of sleep study appointment details.

## 2021-11-14 NOTE — Telephone Encounter (Signed)
-----   Message from Juluis Mire, RN sent at 11/13/2021  4:09 PM EST ----- Regarding: sleep study Pt needs sleep study for snoring and witnessed apnea per clint fenton Thanks [name] Board

## 2021-11-15 DIAGNOSIS — F01A0 Vascular dementia, mild, without behavioral disturbance, psychotic disturbance, mood disturbance, and anxiety: Secondary | ICD-10-CM | POA: Diagnosis not present

## 2021-11-15 DIAGNOSIS — I130 Hypertensive heart and chronic kidney disease with heart failure and stage 1 through stage 4 chronic kidney disease, or unspecified chronic kidney disease: Secondary | ICD-10-CM | POA: Diagnosis not present

## 2021-11-15 DIAGNOSIS — I5031 Acute diastolic (congestive) heart failure: Secondary | ICD-10-CM | POA: Diagnosis not present

## 2021-11-15 DIAGNOSIS — N1832 Chronic kidney disease, stage 3b: Secondary | ICD-10-CM | POA: Diagnosis not present

## 2021-11-15 DIAGNOSIS — I4892 Unspecified atrial flutter: Secondary | ICD-10-CM | POA: Diagnosis not present

## 2021-11-15 DIAGNOSIS — R32 Unspecified urinary incontinence: Secondary | ICD-10-CM | POA: Diagnosis not present

## 2021-11-15 DIAGNOSIS — I480 Paroxysmal atrial fibrillation: Secondary | ICD-10-CM | POA: Diagnosis not present

## 2021-11-15 DIAGNOSIS — Z48810 Encounter for surgical aftercare following surgery on the sense organs: Secondary | ICD-10-CM | POA: Diagnosis not present

## 2021-11-15 LAB — BASIC METABOLIC PANEL WITH GFR
BUN/Creatinine Ratio: 15 (calc) (ref 6–22)
BUN: 22 mg/dL (ref 7–25)
CO2: 24 mmol/L (ref 20–32)
Calcium: 9.8 mg/dL (ref 8.6–10.4)
Chloride: 105 mmol/L (ref 98–110)
Creat: 1.47 mg/dL — ABNORMAL HIGH (ref 0.60–1.00)
Glucose, Bld: 89 mg/dL (ref 65–139)
Potassium: 4.3 mmol/L (ref 3.5–5.3)
Sodium: 142 mmol/L (ref 135–146)
eGFR: 37 mL/min/{1.73_m2} — ABNORMAL LOW (ref >=60)

## 2021-11-15 LAB — CBC WITH DIFFERENTIAL/PLATELET
Absolute Monocytes: 783 cells/uL (ref 200–950)
Basophils Absolute: 44 cells/uL (ref 0–200)
Basophils Relative: 0.5 %
Eosinophils Absolute: 87 cells/uL (ref 15–500)
Eosinophils Relative: 1 %
HCT: 43.8 % (ref 35.0–45.0)
Hemoglobin: 14.2 g/dL (ref 11.7–15.5)
Lymphs Abs: 3497 cells/uL (ref 850–3900)
MCH: 28.4 pg (ref 27.0–33.0)
MCHC: 32.4 g/dL (ref 32.0–36.0)
MCV: 87.6 fL (ref 80.0–100.0)
MPV: 10.1 fL (ref 7.5–12.5)
Monocytes Relative: 9 %
Neutro Abs: 4289 cells/uL (ref 1500–7800)
Neutrophils Relative %: 49.3 %
Platelets: 257 10*3/uL (ref 140–400)
RBC: 5 10*6/uL (ref 3.80–5.10)
RDW: 12.6 % (ref 11.0–15.0)
Total Lymphocyte: 40.2 %
WBC: 8.7 10*3/uL (ref 3.8–10.8)

## 2021-11-17 ENCOUNTER — Other Ambulatory Visit: Payer: Self-pay | Admitting: Family

## 2021-11-18 ENCOUNTER — Telehealth: Payer: Self-pay | Admitting: *Deleted

## 2021-11-18 NOTE — Telephone Encounter (Signed)
[name] with [facility] requesting verbal orders for Home Health PT 1X9weeks. Verbal orders given.

## 2021-11-19 ENCOUNTER — Other Ambulatory Visit (HOSPITAL_COMMUNITY): Payer: Self-pay

## 2021-11-19 MED ORDER — APIXABAN 5 MG PO TABS: 5.0000 mg | ORAL_TABLET | Freq: Two times a day (BID) | ORAL | 0 refills | Status: DC

## 2021-11-21 NOTE — Progress Notes (Signed)
Provider: Marlowe Sax FNP-C  Gulianna Hornsby, Nelda Bucks, NP  Patient Care Team: Jeferson Boozer, Nelda Bucks, NP as PCP - General (Family Medicine) [name], MD as Consulting Physician (Ophthalmology)  Extended Emergency Contact Information Primary Emergency Contact: Montas,[name] Address: [address], [place] [address] [name] of [place] Phone: [phone] Mobile Phone: [phone] Relation: Daughter  Code Status:  Full Code  Goals of care: Advanced Directive information Advanced Directives 11/14/2021  Does Patient Have a Medical Advance Directive? No  Type of Advance Directive -  Does patient want to make changes to medical advance directive? -  Copy of [place] in Chart? -  Would patient like information on creating a medical advance directive? No - Patient declined  Pre-existing out of facility DNR order (yellow form or pink MOST form) -     Chief Complaint  Patient presents with   Transitions Of Care    [facility] 11/08/2021-11/11/2021    HPI:  Pt is a 75 y.o. female seen today for transition of care post hospitalization from 11/08/2021 - 11/11/2021 for acute heart failure after presenting with new onset of shortness of breath and peripheral edema x 3 days.Daughter states patient was diagnosed with atrial flutter 11/05/2021 during cataract surgery though states she had shortness of breath and leg edema prior to surgery.she was advised to follow up with PCP.she was seen here by Elodia Florence and had abnormal EKG Aflutter with HR 138,shortness of breath and weakness.she was send to ED for further evaluation.  In ED She had elevated BNP and Tachypnea.CXR done showed borderline cardiomegaly with vascular congestion and mild pulmonary edema.ECHO EF 65-70%.she was started on Lasix and placed on strict I &O with daily weights.Her weight trended down.   Also had new onset of AFib with RVR CHA2DS2-VAS score 7 treated with low dose beta blocker for rate  control and EliQuis.Advised to follow up on outpatient Afib clinic on discharge. Her CKD stage 3 remained on baseline 1.3 - 1.4 .Her condition stabilized and was discharge home to follow up on outpatient with cardiologist.also discharged with Home health Physical Therapy.  She is here with daughter.she denies any acute issues.shortness of breath has resolved.Due for follow up labs BMP   Past Medical History:  Diagnosis Date   Cataract    slow growing    Chronic kidney disease    Hyperlipidemia    Hypertension    Insomnia, unspecified    Memory loss    mild dementia per daughter    Osteopenia    Other abnormal blood chemistry    Other specified cardiac dysrhythmias(427.89)    Other specified disease of nail    Stroke ([place])    2006 or 2007 per daughter    Syncope and collapse    Unspecified disorder of kidney and ureter    Unspecified late effects of cerebrovascular disease    Unspecified transient cerebral ischemia    Unspecified urinary incontinence    Unspecified vitamin D deficiency    Vascular dementia, uncomplicated ([place])    Past Surgical History:  Procedure Laterality Date   NO PAST SURGERIES      No Known Allergies  Outpatient Encounter Medications as of 11/14/2021  Medication Sig   brimonidine (ALPHAGAN) 0.2 % ophthalmic solution Place 1 drop into the right eye 4 (four) times daily.   cholecalciferol (VITAMIN D) 1000 units tablet Take 1,000 Units by mouth daily.   hydrALAZINE (APRESOLINE) 25 MG tablet TAKE 1  TABLET THREE TIMES DAILY WITH MEALS TO CONTROL BLOOD PRESSURE   losartan (COZAAR) 50 MG tablet Take 1/2 tablet (25 mg total) by mouth daily.   Multiple Vitamin (MULTIVITAMIN) tablet Take 1 tablet by mouth daily.   NAMZARIC 28-10 MG CP24 TAKE 1 CAPSULE  DAILY TO PRESERVE MEMORY   oxybutynin (DITROPAN) 5 MG tablet TAKE 1 TABLET ONE TIME DAILY FOR BLADDER CONTROL   potassium chloride SA (KLOR-CON M) 20 MEQ tablet Take 1 tablet (20 mEq total) by mouth daily.    [DISCONTINUED] apixaban (ELIQUIS) 5 MG TABS tablet Take 1 tablet (5 mg total) by mouth 2 (two) times daily.   [DISCONTINUED] furosemide (LASIX) 20 MG tablet Take 1 tablet (20 mg total) by mouth daily.   [DISCONTINUED] metoprolol tartrate (LOPRESSOR) 25 MG tablet Take 1/2 tablet (12.5 mg total) by mouth 2 (two) times daily.   [DISCONTINUED] simvastatin (ZOCOR) 40 MG tablet TAKE 1 TABLET EVERY DAY AT 6PM   furosemide (LASIX) 20 MG tablet Take 1 tablet (20 mg total) by mouth daily.   metoprolol tartrate (LOPRESSOR) 25 MG tablet Take 0.5 tablets (12.5 mg total) by mouth 2 (two) times daily.   No facility-administered encounter medications on file as of 11/14/2021.    Review of Systems  Constitutional:  Negative for appetite change, chills, fatigue, fever and unexpected weight change.  HENT:  Negative for congestion, dental problem, ear discharge, ear pain, facial swelling, hearing loss, nosebleeds, postnasal drip, rhinorrhea, sinus pressure, sinus pain, sneezing, sore throat, tinnitus and trouble swallowing.   Eyes:  Negative for pain, discharge, redness, itching and visual disturbance.  Respiratory:  Negative for cough, chest tightness, shortness of breath and wheezing.   Cardiovascular:  Negative for chest pain, palpitations and leg swelling.  Gastrointestinal:  Negative for abdominal distention, abdominal pain, blood in stool, constipation, diarrhea, nausea and vomiting.  Endocrine: Negative for cold intolerance, heat intolerance, polydipsia, polyphagia and polyuria.  Genitourinary:  Negative for difficulty urinating, dysuria, flank pain, frequency and urgency.  Musculoskeletal:  Positive for gait problem. Negative for arthralgias, back pain, joint swelling, myalgias, neck pain and neck stiffness.  Skin:  Negative for color change, pallor, rash and wound.  Neurological:  Negative for dizziness, syncope, speech difficulty, light-headedness, numbness and headaches.       Right side weakness hx of  stroke   Hematological:  Does not bruise/bleed easily.  Psychiatric/Behavioral:  Negative for agitation, behavioral problems, confusion, hallucinations, self-injury, sleep disturbance and suicidal ideas. The patient is not nervous/anxious.    Immunization History  Administered Date(s) Administered   Fluad Quad(high Dose 65+) 11/22/2020, 09/06/2021   Influenza, High Dose Seasonal PF 12/25/2017, 08/19/2018   Influenza,inj,Quad PF,6+ Mos 10/17/2013, 12/07/2015, 09/01/2016   Influenza-Unspecified 08/25/2008, 09/12/2011, 09/27/2012   PFIZER(Purple Top)SARS-COV-2 Vaccination 12/27/2019, 01/17/2020   Pneumococcal Conjugate-13 08/04/2014   Pneumococcal-Unspecified 05/07/2011   Tdap 05/07/2011   Pertinent  Health Maintenance Due  Topic Date Due   INFLUENZA VACCINE  Completed   DEXA SCAN  Completed   Fall Risk 11/09/2021 11/09/2021 11/10/2021 11/11/2021 11/14/2021  Falls in the past year? - - - - 0  Was there an injury with Fall? - - - - 0  Fall Risk Category Calculator - - - - 0  Fall Risk Category - - - - Low  Patient Fall Risk Level High fall risk High fall risk High fall risk High fall risk Low fall risk  Patient at Risk for Falls Due to - - - - No Fall Risks  Fall risk Follow up - - - -  Falls evaluation completed   Functional Status Survey:    Vitals:   11/14/21 1512  BP: 128/80  Pulse: 63  Resp: 18  Temp: (!) 96.2 F (35.7 C)  SpO2: 95%  Weight: 248 lb 9.6 oz (112.8 kg)  Height: _0  (1.651 m)   Body mass index is 41.37 kg/m. Physical Exam Vitals reviewed.  Constitutional:      General: She is not in acute distress.    Appearance: Normal appearance. She is obese. She is not ill-appearing or diaphoretic.  HENT:     Head: Normocephalic.     Right Ear: Tympanic membrane, ear canal and external ear normal. There is no impacted cerumen.     Left Ear: Tympanic membrane, ear canal and external ear normal. There is no impacted cerumen.     Nose: Nose normal. No congestion or  rhinorrhea.     Mouth/Throat:     Mouth: Mucous membranes are moist.     Pharynx: Oropharynx is clear. No oropharyngeal exudate or posterior oropharyngeal erythema.  Eyes:     General: No scleral icterus.       Right eye: No discharge.        Left eye: No discharge.     Extraocular Movements: Extraocular movements intact.     Conjunctiva/sclera: Conjunctivae normal.     Pupils: Pupils are equal, round, and reactive to light.  Neck:     Vascular: No carotid bruit.  Cardiovascular:     Rate and Rhythm: Normal rate and regular rhythm.     Pulses: Normal pulses.     Heart sounds: Normal heart sounds. No murmur heard.   No friction rub. No gallop.  Pulmonary:     Effort: Pulmonary effort is normal. No respiratory distress.     Breath sounds: Normal breath sounds. No wheezing, rhonchi or rales.  Chest:     Chest wall: No tenderness.  Abdominal:     General: Bowel sounds are normal. There is no distension.     Palpations: Abdomen is soft. There is no mass.     Tenderness: There is no abdominal tenderness. There is no right CVA tenderness, left CVA tenderness, guarding or rebound.  Musculoskeletal:        General: No swelling or tenderness. Normal range of motion.     Cervical back: Normal range of motion. No rigidity or tenderness.     Right lower leg: No edema.     Left lower leg: No edema.     Comments: Unsteady gait holds onto daughter's hand when walking   Lymphadenopathy:     Cervical: No cervical adenopathy.  Skin:    General: Skin is warm and dry.     Coloration: Skin is not pale.     Findings: No bruising, erythema, lesion or rash.  Neurological:     Mental Status: She is alert and oriented to person, place, and time.     Cranial Nerves: No cranial nerve deficit.     Sensory: No sensory deficit.     Motor: Weakness present.     Coordination: Coordination normal.     Gait: Gait normal.     Comments: Right side weakness hx of stroke   Psychiatric:        Mood and  Affect: Mood normal.        Speech: Speech normal.        Behavior: Behavior normal.        Thought Content: Thought content normal.  Judgment: Judgment normal.    Labs reviewed: Recent Labs    11/08/21 1302 11/09/21 0004 11/10/21 0703 11/11/21 0330 11/14/21 1549  NA  --  138 138 138 142  K  --  4.2 3.6 4.1 4.3  CL  --  103 101 101 105  CO2  --  _0 GLUCOSE  --  110* 114* 92 89  BUN  --  _1 CREATININE  --  1.53* 1.50* 1.49* 1.47*  CALCIUM  --  9.3 8.9 8.9 9.8  MG 2.1 2.0  --   --   --    Recent Labs    05/02/21 0902 11/08/21 1231 11/09/21 0004  AST _2 ALT _3 ALKPHOS  --  97 81  BILITOT 0.5 1.1 0.9  PROT 7.4 7.9 6.7  ALBUMIN  --  3.9 3.4*   Recent Labs    11/08/21 1231 11/09/21 0004 11/10/21 0703 11/14/21 1549  WBC 9.9 10.0 8.0 8.7  NEUTROABS 7.6 6.0  --  4,289  HGB 14.6 12.7 13.1 14.2  HCT 45.8 38.8 41.2 43.8  MCV 90.7 86.6 88.0 87.6  PLT 207 225 226 257   Lab Results  Component Value Date   TSH 1.419 11/08/2021   Lab Results  Component Value Date   HGBA1C 5.4 05/02/2021   Lab Results  Component Value Date   CHOL 177 05/02/2021   HDL 70 05/02/2021   LDLCALC 93 05/02/2021   TRIG 57 05/02/2021   CHOLHDL 2.5 05/02/2021    Significant Diagnostic Results in last 30 days:  DG Chest 2 View  Result Date: 11/08/2021 CLINICAL DATA:  Shortness of breath, irregular heartbeat EXAM: CHEST - 2 VIEW COMPARISON:  Chest radiograph 03/08/2014 FINDINGS: The heart is at the upper limits of normal for size. There are patchy opacities in the right lung base. There is vascular congestion with possible mild pulmonary interstitial edema. There is no pleural effusion or pneumothorax. There is no acute osseous abnormality. IMPRESSION: 1. Borderline cardiomegaly with vascular congestion and possible mild pulmonary interstitial edema. 2. Patchy opacities in the right lung base may reflect atelectasis or infection. Electronically Signed    By: Valetta Mole M.D.   On: 11/08/2021 13:01   ECHOCARDIOGRAM COMPLETE  Result Date: 11/09/2021    ECHOCARDIOGRAM REPORT   Patient Name:   KENORA SPAYD Date of Exam: 11/09/2021 Medical Rec #:  [id number]     Height:       65.0 in Accession #:    [id number]    Weight:       254.0 lb Date of Birth:  [birth year]     BSA:          2.190 m Patient Age:    22 years      BP:           103/90 mmHg Patient Gender: F             HR:           75 bpm. Exam Location:  Inpatient Procedure: 2D Echo, Cardiac Doppler and Color Doppler Indications:    CHF  History:        Patient has no prior history of Echocardiogram examinations.                 Arrythmias:Atrial Fibrillation; Signs/Symptoms:Shortness of                 Breath.  Sonographer:  Jyl Heinz Referring Phys: [id number] [place]  1. Left ventricular ejection fraction, by estimation, is 65 to 70%. The left ventricle has normal function. The left ventricle has no regional wall motion abnormalities. Left ventricular diastolic parameters are indeterminate. Elevated left ventricular end-diastolic pressure.  2. Right ventricular systolic function is normal. The right ventricular size is normal.  3. The mitral valve is normal in structure. No evidence of mitral valve regurgitation. No evidence of mitral stenosis.  4. The aortic valve is normal in structure. Aortic valve regurgitation is not visualized. No aortic stenosis is present. FINDINGS  Left Ventricle: Left ventricular ejection fraction, by estimation, is 65 to 70%. The left ventricle has normal function. The left ventricle has no regional wall motion abnormalities. The left ventricular internal cavity size was normal in size. There is  no left ventricular hypertrophy. Left ventricular diastolic parameters are indeterminate. Elevated left ventricular end-diastolic pressure. Right Ventricle: The right ventricular size is normal. Right vetricular wall thickness was not well visualized. Right  ventricular systolic function is normal. Left Atrium: Left atrial size was normal in size. Right Atrium: Right atrial size was normal in size. Pericardium: There is no evidence of pericardial effusion. Mitral Valve: The mitral valve is normal in structure. No evidence of mitral valve regurgitation. No evidence of mitral valve stenosis. Tricuspid Valve: The tricuspid valve is grossly normal. Tricuspid valve regurgitation is trivial. Aortic Valve: The aortic valve is normal in structure. Aortic valve regurgitation is not visualized. No aortic stenosis is present. Aortic valve peak gradient measures 12.1 mmHg. Pulmonic Valve: The pulmonic valve was normal in structure. Pulmonic valve regurgitation is trivial. Aorta: The aortic root and ascending aorta are structurally normal, with no evidence of dilitation. IAS/Shunts: The interatrial septum was not well visualized.  LEFT VENTRICLE PLAX 2D LVIDd:         4.60 cm     Diastology LVIDs:         3.40 cm     LV e' medial:    5.55 cm/s LV PW:         1.20 cm     LV E/e' medial:  17.1 LV IVS:        1.20 cm     LV e' lateral:   5.00 cm/s LVOT diam:     2.00 cm     LV E/e' lateral: 18.9 LV SV:         69 LV SV Index:   32 LVOT Area:     3.14 cm  LV Volumes (MOD) LV vol d, MOD A2C: 78.7 ml LV vol d, MOD A4C: 66.1 ml LV vol s, MOD A2C: 31.9 ml LV vol s, MOD A4C: 27.6 ml LV SV MOD A2C:     46.8 ml LV SV MOD A4C:     66.1 ml LV SV MOD BP:      43.2 ml RIGHT VENTRICLE            IVC RV Basal diam:  3.20 cm    IVC diam: 2.20 cm RV Mid diam:    2.80 cm RV S prime:     9.36 cm/s TAPSE (M-mode): 2.0 cm LEFT ATRIUM             Index        RIGHT ATRIUM           Index LA diam:        3.60 cm 1.64 cm/m   RA Area:  14.80 cm LA Vol (A2C):   46.4 ml 21.19 ml/m  RA Volume:   37.20 ml  16.99 ml/m LA Vol (A4C):   54.5 ml 24.89 ml/m LA Biplane Vol: 51.1 ml 23.34 ml/m  AORTIC VALVE AV Area (Vmax): 1.97 cm AV Vmax:        174.00 cm/s AV Peak Grad:   12.1 mmHg LVOT Vmax:      109.00  cm/s LVOT Vmean:     80.600 cm/s LVOT VTI:       0.220 m  AORTA Ao Root diam: 2.60 cm Ao Asc diam:  2.70 cm MITRAL VALVE MV Area (PHT): 2.53 cm     SHUNTS MV Decel Time: 300 msec     Systemic VTI:  0.22 m MV E velocity: 94.70 cm/s   Systemic Diam: 2.00 cm MV A velocity: 127.00 cm/s MV E/A ratio:  0.75 Mertie Moores MD Electronically signed by Mertie Moores MD Signature Date/Time: 11/09/2021/4:26:15 PM    Final     Assessment/Plan  1. AF (paroxysmal atrial fibrillation) (HCC) HR controlled  Continue on metoprolol for rate control. - continue on EliQuis - continue to follow up with Afib Clinic  - CBC with Differential/Platelet - BMP with eGFR([facility]) - metoprolol tartrate (LOPRESSOR) 25 MG tablet; Take 0.5 tablets (12.5 mg total) by mouth 2 (two) times daily.  Dispense: 90 tablet; Refill: 0  2. Benign hypertension with CKD (chronic kidney disease) stage III (HCC) B/p well controlled  - continue on Metoprol,Hydralazine,losartan,lasix  and Amlodipine  - BMP with eGFR([facility]) - furosemide (LASIX) 20 MG tablet; Take 1 tablet (20 mg total) by mouth daily.  Dispense: 360 tablet; Refill: 0 - metoprolol tartrate (LOPRESSOR) 25 MG tablet; Take 0.5 tablets (12.5 mg total) by mouth 2 (two) times daily.  Dispense: 90 tablet; Refill: 0  3. Edema of both lower extremities Has improved with lasix  - continue on Lasix  - start on potassium chloride 20 meq tablet daily  - furosemide (LASIX) 20 MG tablet; Take 1 tablet (20 mg total) by mouth daily.  Dispense: 360 tablet; Refill: 0 - BMP with eGFR([facility])  4. Vascular dementia without behavioral disturbance (HCC) No behavioral issues reported  - continue with supportive care  - continue on Namzaric   Family/ staff Communication: Reviewed plan of care with patient and daughter verbalized understanding   Labs/tests ordered: - BMP with eGFR([facility])  Next Appointment: 1 month for BMP   Sandrea Hughs, NP

## 2021-11-27 ENCOUNTER — Other Ambulatory Visit (HOSPITAL_COMMUNITY): Payer: Self-pay

## 2021-11-27 ENCOUNTER — Telehealth (HOSPITAL_COMMUNITY): Payer: Self-pay

## 2021-11-27 NOTE — Telephone Encounter (Signed)
Pharmacy Transitions of Care Follow-up Telephone Call  Date of discharge: 11/11/21  Discharge Diagnosis: Afib  How have you been since you were released from the hospital?  Patient well since discharge, no questions about meds at this time.  Medication changes made at discharge:  - START: Eliquis  - STOPPED: Amlodipine, ASA  Medication changes verified by the patient? Yes    Medication Accessibility:  Home Pharmacy: Not discussed   Was the patient provided with refills on discharged medications? No   Have all prescriptions been transferred from [facility] to home pharmacy? N/A   Is the patient able to afford medications? Has insurance    Medication Review:  APIXABAN (ELIQUIS)  Apixaban 5 mg BID initiated on 11/19/21.  - Discussed importance of taking medication around the same time everyday  - Advised patient of medications to avoid (NSAIDs, ASA)  - Educated that Tylenol (acetaminophen) will be the preferred analgesic to prevent risk of bleeding  - Emphasized importance of monitoring for signs and symptoms of bleeding (abnormal bruising, prolonged bleeding, nose bleeds, bleeding from gums, discolored urine, black tarry stools)  - Advised patient to alert all providers of anticoagulation therapy prior to starting a new medication or having a procedure   Follow-up Appointments:  PCP Hospital f/u appt confirmed? Already seen by Dr. Kelby Fam on 11/14/21. Scheduled to see Dr. Kelby Fam on 12/20/21 @ 2:15pm.   [facility] f/u appt confirmed?  Scheduled to see Dr.Nahser on 12/23/21 @ 1:00pm.   If their condition worsens, is the pt aware to call PCP or go to the Emergency Dept.? yes  Final Patient Assessment: Patient has follow up scheduled and PCP has sent refills to home pharmacy

## 2021-12-06 ENCOUNTER — Encounter (HOSPITAL_BASED_OUTPATIENT_CLINIC_OR_DEPARTMENT_OTHER): Payer: Medicare HMO | Admitting: Cardiovascular Disease

## 2021-12-10 ENCOUNTER — Ambulatory Visit: Payer: Medicare HMO

## 2021-12-10 ENCOUNTER — Other Ambulatory Visit: Payer: Medicare HMO

## 2021-12-13 DIAGNOSIS — I5031 Acute diastolic (congestive) heart failure: Secondary | ICD-10-CM | POA: Diagnosis not present

## 2021-12-13 DIAGNOSIS — E785 Hyperlipidemia, unspecified: Secondary | ICD-10-CM | POA: Diagnosis not present

## 2021-12-13 DIAGNOSIS — R32 Unspecified urinary incontinence: Secondary | ICD-10-CM | POA: Diagnosis not present

## 2021-12-13 DIAGNOSIS — I4892 Unspecified atrial flutter: Secondary | ICD-10-CM | POA: Diagnosis not present

## 2021-12-13 DIAGNOSIS — F01A0 Vascular dementia, mild, without behavioral disturbance, psychotic disturbance, mood disturbance, and anxiety: Secondary | ICD-10-CM | POA: Diagnosis not present

## 2021-12-13 DIAGNOSIS — N1832 Chronic kidney disease, stage 3b: Secondary | ICD-10-CM | POA: Diagnosis not present

## 2021-12-13 DIAGNOSIS — I480 Paroxysmal atrial fibrillation: Secondary | ICD-10-CM | POA: Diagnosis not present

## 2021-12-13 DIAGNOSIS — Z48810 Encounter for surgical aftercare following surgery on the sense organs: Secondary | ICD-10-CM | POA: Diagnosis not present

## 2021-12-13 DIAGNOSIS — I130 Hypertensive heart and chronic kidney disease with heart failure and stage 1 through stage 4 chronic kidney disease, or unspecified chronic kidney disease: Secondary | ICD-10-CM | POA: Diagnosis not present

## 2021-12-17 ENCOUNTER — Ambulatory Visit: Payer: Medicare HMO | Admitting: Internal Medicine

## 2021-12-20 ENCOUNTER — Ambulatory Visit: Payer: Medicare HMO | Admitting: Family

## 2021-12-23 ENCOUNTER — Other Ambulatory Visit: Payer: Self-pay

## 2021-12-23 ENCOUNTER — Ambulatory Visit: Payer: Medicare HMO | Admitting: Cardiovascular Disease

## 2021-12-23 ENCOUNTER — Encounter: Payer: Self-pay | Admitting: Cardiovascular Disease

## 2021-12-23 VITALS — BP 148/90 | HR 58 | Ht 65.0 in | Wt 248.2 lb

## 2021-12-23 DIAGNOSIS — I480 Paroxysmal atrial fibrillation: Secondary | ICD-10-CM | POA: Diagnosis not present

## 2021-12-23 NOTE — Progress Notes (Signed)
Cardiology Office Note:    Date:  12/23/2021   ID:  [name], DOB Sep 22, 1946, MRN [id number]  PCP:  Sandrea Hughs, NP   [facility] Providers Cardiologist:  None {  Referring MD: Oliver Barre, PA   Chief Complaint  Patient presents with   Atrial Fibrillation    History of Present Illness:    [name] is a 77 y.o. female with a hx of atrial fib, CKD, HLD, HTN, dementia , Seen with dauhgter, [name].   We were asked to see [name] by Marlowe Sax, NP  for further evaluation of her atrial fib  She was diagnosed with atrial flutter, atrial fib on 11/29/ 22. She converted spontaneously to sinus rhythm , is on eliquis , metoprolol 12.5 BID   Her daughter says that she is more short of breath when she is in atrial fib.   No CP , no dizziness,    HR is a bit slow but she is asymptomatic  No issues today .   Daughter answers almost all the questinons    Past Medical History:  Diagnosis Date   Cataract    slow growing    Chronic kidney disease    Hyperlipidemia    Hypertension    Insomnia, unspecified    Memory loss    mild dementia per daughter    Osteopenia    Other abnormal blood chemistry    Other specified cardiac dysrhythmias(427.89)    Other specified disease of nail    Stroke ([place])    2006 or 2007 per daughter    Syncope and collapse    Unspecified disorder of kidney and ureter    Unspecified late effects of cerebrovascular disease    Unspecified transient cerebral ischemia    Unspecified urinary incontinence    Unspecified vitamin D deficiency    Vascular dementia, uncomplicated ([place])     Past Surgical History:  Procedure Laterality Date   NO PAST SURGERIES      Current Medications: Current Meds  Medication Sig   amLODipine (NORVASC) 10 MG tablet TAKE 1 TABLET EVERY DAY FOR BLOOD PRESSURE   apixaban (ELIQUIS) 5 MG TABS tablet Take 1 tablet (5 mg total) by mouth 2 (two) times daily.   cholecalciferol (VITAMIN D) 1000 units  tablet Take 1,000 Units by mouth daily.   furosemide (LASIX) 20 MG tablet Take 1 tablet (20 mg total) by mouth daily.   hydrALAZINE (APRESOLINE) 25 MG tablet TAKE 1 TABLET THREE TIMES DAILY WITH MEALS TO CONTROL BLOOD PRESSURE   losartan (COZAAR) 50 MG tablet Take 1/2 tablet (25 mg total) by mouth daily.   metoprolol tartrate (LOPRESSOR) 25 MG tablet Take 0.5 tablets (12.5 mg total) by mouth 2 (two) times daily.   Multiple Vitamin (MULTIVITAMIN) tablet Take 1 tablet by mouth daily.   NAMZARIC 28-10 MG CP24 TAKE 1 CAPSULE  DAILY TO PRESERVE MEMORY   oxybutynin (DITROPAN) 5 MG tablet TAKE 1 TABLET ONE TIME DAILY FOR BLADDER CONTROL   potassium chloride SA (KLOR-CON M) 20 MEQ tablet Take 1 tablet (20 mEq total) by mouth daily.   simvastatin (ZOCOR) 40 MG tablet TAKE 1 TABLET EVERY DAY AT 6PM     Allergies:   Patient has no known allergies.   Social History   Socioeconomic History   Marital status: Single    Spouse name: Not on file   Number of children: Not on file   Years of education: Not on file   Highest education  level: Not on file  Occupational History   Not on file  Tobacco Use   Smoking status: Former    Years: 30.00    Types: Cigarettes   Smokeless tobacco: Never   Tobacco comments:    Quit about 7-8 years ago as of 2015   Substance and Sexual Activity   Alcohol use: No   Drug use: No   Sexual activity: Never  Other Topics Concern   Not on file  Social History Narrative   Not on file   Social Determinants of Health   Financial Resource Strain: Not on file  Food Insecurity: Not on file  Transportation Needs: Not on file  Physical Activity: Not on file  Stress: Not on file  Social Connections: Not on file     Family History: The patient's family history includes Hypertension in her brother; Kidney disease in her sister; Pneumonia in her mother. There is no history of Colon cancer, Colon polyps, Esophageal cancer, Rectal cancer, or Stomach cancer.  ROS:    Please see the history of present illness.     All other systems reviewed and are negative.  EKGs/Labs/Other Studies Reviewed:    The following studies were reviewed today:   EKG:    Recent Labs: 11/08/2021: B Natriuretic Peptide 408.2; TSH 1.419 11/09/2021: ALT 20; Magnesium 2.0 11/14/2021: BUN 22; Creat 1.47; Hemoglobin 14.2; Platelets 257; Potassium 4.3; Sodium 142  Recent Lipid Panel    Component Value Date/Time   CHOL 177 05/02/2021 0902   CHOL 178 12/07/2015 1100   TRIG 57 05/02/2021 0902   HDL 70 05/02/2021 0902   HDL 81 12/07/2015 1100   CHOLHDL 2.5 05/02/2021 0902   VLDL 8 09/01/2016 0843   LDLCALC 93 05/02/2021 0902     Risk Assessment/Calculations:    CHA2DS2-VASc Score = 7   This indicates a 11.2% annual risk of stroke. The patient's score is based upon: CHF History: 1 HTN History: 1 Diabetes History: 0 Stroke History: 2 Vascular Disease History: 0 Age Score: 2 Gender Score: 1        Physical Exam:    VS:  BP (!) 148/90    Pulse (!) 58    Ht 5\' 5"  (1.651 m)    Wt 248 lb 3.2 oz (112.6 kg)    SpO2 97%    BMI 41.30 kg/m     Wt Readings from Last 3 Encounters:  12/23/21 248 lb 3.2 oz (112.6 kg)  11/14/21 248 lb 9.6 oz (112.8 kg)  11/13/21 246 lb 12.8 oz (111.9 kg)     GEN:  Well nourished, well developed in no acute distress HEENT: Normal NECK: No JVD; No carotid bruits LYMPHATICS: No lymphadenopathy CARDIAC: RRR, no murmurs, rubs, gallops,  occasional premature beat  RESPIRATORY:  Clear to auscultation without rales, wheezing or rhonchi  ABDOMEN: Soft, non-tender, non-distended MUSCULOSKELETAL:  No edema; No deformity  SKIN: Warm and dry NEUROLOGIC:  Alert and oriented x 3 PSYCHIATRIC:  Normal affect   ASSESSMENT:    1. AF (paroxysmal atrial fibrillation) (HCC)    PLAN:    In order of problems listed above:  Paroxysmal atrial fibrillation: She is currently on Eliquis.  She is on metoprolol for rate control.  Her heart rate is a bit  slow today but she goes fairly fast when she is in A. fib.  She is not having any episodes of syncope or presyncope.  I think her best option is to continue with the metoprolol 12.5 mg twice a day  at this point  2.  Hypertension: Her blood pressure was very difficult to take today.  It is likely 0923 -300  systolic by palpation.  Cont current meds.    Further plans per her primary provider   3.  Obesity:   advised better diet   4.  PVCs:   cliniclly it sounds like she has some PVCs on exam today            Medication Adjustments/Labs and Tests Ordered: Current medicines are reviewed at length with the patient today.  Concerns regarding medicines are outlined above.  No orders of the defined types were placed in this encounter.  No orders of the defined types were placed in this encounter.   Patient Instructions  Medication Instructions:  Your physician recommends that you continue on your current medications as directed. Please refer to the Current Medication list given to you today.  *If you need a refill on your cardiac medications before your next appointment, please call your pharmacy*   Lab Work: None ordered   Testing/Procedures: None ordered   Follow-Up: At [facility], you and your health needs are our priority.  As part of our continuing mission to provide you with exceptional heart care, we have created designated Provider Care Teams.  These Care Teams include your primary Cardiologist (physician) and Advanced Practice Providers (APPs -  Physician Assistants and Nurse Practitioners) who all work together to provide you with the care you need, when you need it.  Your next appointment:   1 year(s)  The format for your next appointment:   In Person  Provider:   PA or NP that work with Dr. Acie Fredrickson    Thank you for choosing [facility]!!   [phone]     Signed, Mertie Moores, MD  12/23/2021 2:39 PM    [place]

## 2021-12-23 NOTE — Patient Instructions (Addendum)
Medication Instructions:  Your physician recommends that you continue on your current medications as directed. Please refer to the Current Medication list given to you today.  *If you need a refill on your cardiac medications before your next appointment, please call your pharmacy*   Lab Work: None ordered   Testing/Procedures: None ordered   Follow-Up: At [facility], you and your health needs are our priority.  As part of our continuing mission to provide you with exceptional heart care, we have created designated Provider Care Teams.  These Care Teams include your primary Cardiologist (physician) and Advanced Practice Providers (APPs -  Physician Assistants and Nurse Practitioners) who all work together to provide you with the care you need, when you need it.  Your next appointment:   1 year(s)  The format for your next appointment:   In Person  Provider:   PA or NP that work with Dr. Acie Fredrickson    Thank you for choosing [facility]!!   [phone]

## 2021-12-26 IMAGING — DX DG CHEST 2V
2 series · 2 of 2 positions shown · non-contrast
Comparison: Chest radiograph 03/08/2014

CLINICAL DATA: Shortness of breath, irregular heartbeat

EXAM:
CHEST - 2 VIEW

[chest lat]
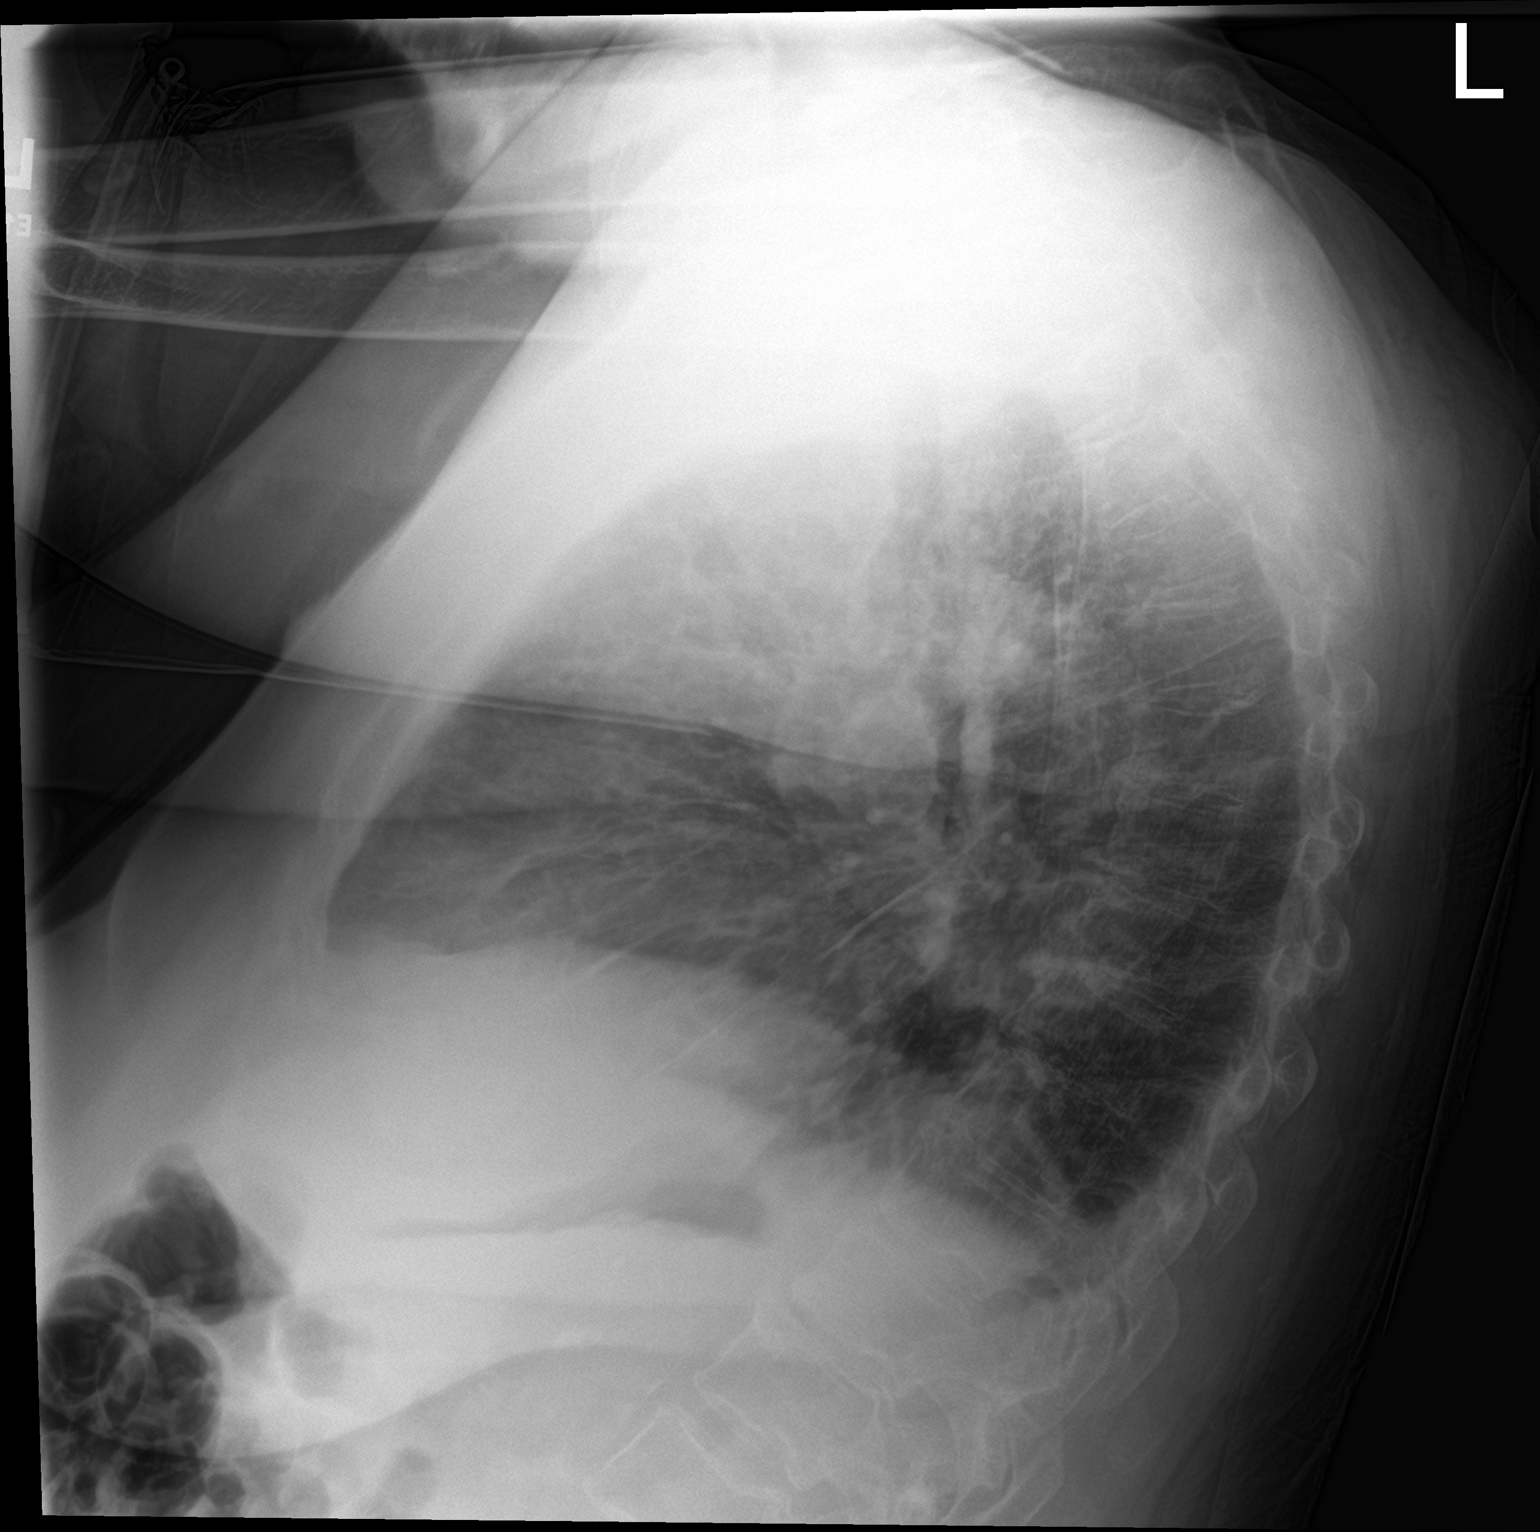

[chest ap]
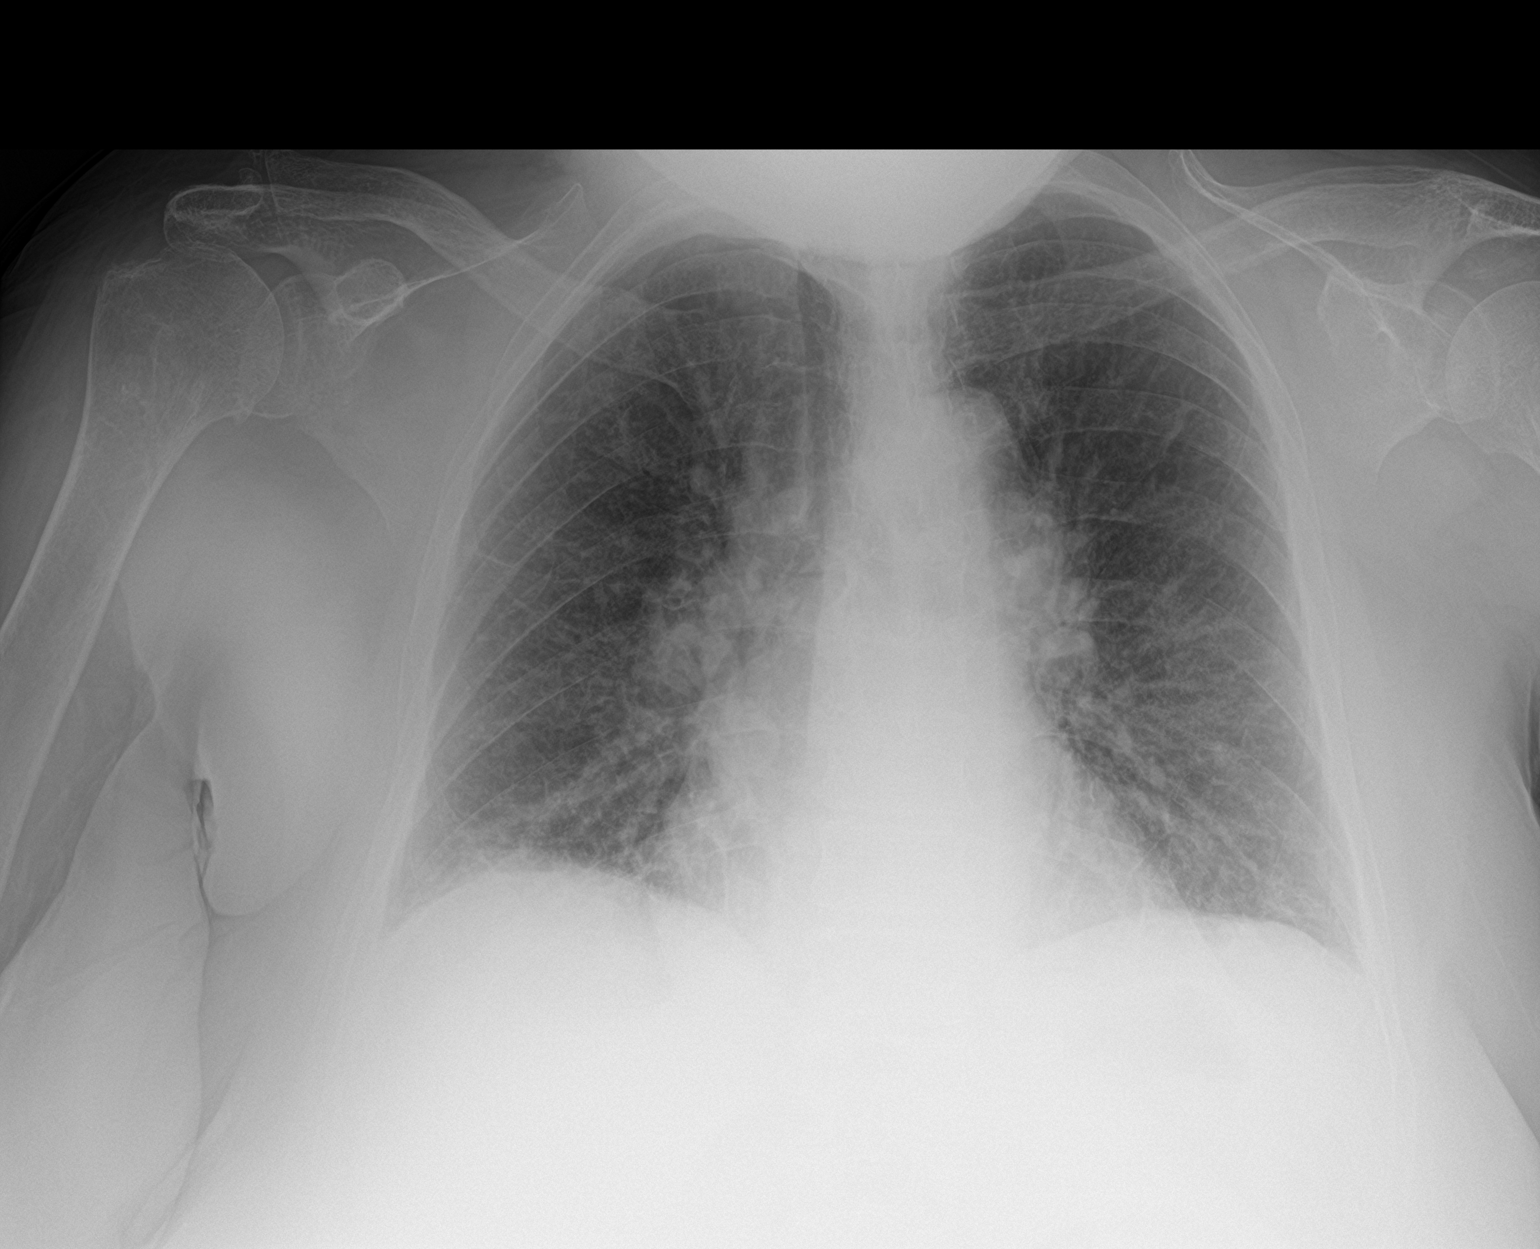

[2 of 2 positions shown; findings below may reference images not displayed]

FINDINGS: The heart is at the upper limits of normal for size.

There are patchy opacities in the right lung base. There is vascular
congestion with possible mild pulmonary interstitial edema. There is
no pleural effusion or pneumothorax.

There is no acute osseous abnormality.
IMPRESSION: 1. Borderline cardiomegaly with vascular congestion and possible
mild pulmonary interstitial edema.
2. Patchy opacities in the right lung base may reflect atelectasis
or infection.

## 2021-12-27 ENCOUNTER — Encounter: Payer: Self-pay | Admitting: Family

## 2021-12-27 ENCOUNTER — Other Ambulatory Visit: Payer: Self-pay

## 2021-12-27 ENCOUNTER — Ambulatory Visit (INDEPENDENT_AMBULATORY_CARE_PROVIDER_SITE_OTHER): Payer: Medicare HMO | Admitting: Family

## 2021-12-27 VITALS — BP 136/88 | HR 58 | Temp 98.0°F | Ht 65.0 in | Wt 247.6 lb

## 2021-12-27 DIAGNOSIS — N183 Chronic kidney disease, stage 3 unspecified: Secondary | ICD-10-CM | POA: Diagnosis not present

## 2021-12-27 DIAGNOSIS — I129 Hypertensive chronic kidney disease with stage 1 through stage 4 chronic kidney disease, or unspecified chronic kidney disease: Secondary | ICD-10-CM | POA: Diagnosis not present

## 2021-12-27 DIAGNOSIS — I480 Paroxysmal atrial fibrillation: Secondary | ICD-10-CM | POA: Diagnosis not present

## 2021-12-27 NOTE — Progress Notes (Signed)
Location:      Provider: Marlowe Sax FNP-C  Chrishonda Hesch, Nelda Bucks, NP  Patient Care Team: Pepper Wyndham, Nelda Bucks, NP as PCP - General (Family Medicine) [name], MD as Consulting Physician (Ophthalmology)  Extended Emergency Contact Information Primary Emergency Contact: Hobson,[name] Address: [address], [place] [address] [name] Phone: [phone] Mobile Phone: [phone] Relation: Daughter  Code Status: Full Code  Goals of care: Advanced Directive information Advanced Directives 11/14/2021  Does Patient Have a Medical Advance Directive? No  Type of Advance Directive -  Does patient want to make changes to medical advance directive? -  Copy of [place] in Chart? -  Would patient like information on creating a medical advance directive? No - Patient declined  Pre-existing out of facility DNR order (yellow form or pink MOST form) -     Chief Complaint  Patient presents with   Acute Visit    Patient presents today for a 1 month BMP recheck.    HPI:  Pt is a 76 y.o. female seen today for an acute visit for one months follow up BMP.she was seen one month ago for hospital follow up CR was 1.53  She denies any acute issues this visit.   Past Medical History:  Diagnosis Date   Cataract    slow growing    Chronic kidney disease    Hyperlipidemia    Hypertension    Insomnia, unspecified    Memory loss    mild dementia per daughter    Osteopenia    Other abnormal blood chemistry    Other specified cardiac dysrhythmias(427.89)    Other specified disease of nail    Stroke ([place])    2006 or 2007 per daughter    Syncope and collapse    Unspecified disorder of kidney and ureter    Unspecified late effects of cerebrovascular disease    Unspecified transient cerebral ischemia    Unspecified urinary incontinence    Unspecified vitamin D deficiency    Vascular dementia, uncomplicated ([place])    Past Surgical  History:  Procedure Laterality Date   NO PAST SURGERIES      No Known Allergies  Outpatient Encounter Medications as of 12/27/2021  Medication Sig   apixaban (ELIQUIS) 5 MG TABS tablet Take 1 tablet (5 mg total) by mouth 2 (two) times daily.   cholecalciferol (VITAMIN D) 1000 units tablet Take 1,000 Units by mouth daily.   furosemide (LASIX) 20 MG tablet Take 1 tablet (20 mg total) by mouth daily.   hydrALAZINE (APRESOLINE) 25 MG tablet TAKE 1 TABLET THREE TIMES DAILY WITH MEALS TO CONTROL BLOOD PRESSURE   losartan (COZAAR) 50 MG tablet Take 1/2 tablet (25 mg total) by mouth daily.   metoprolol tartrate (LOPRESSOR) 25 MG tablet Take 0.5 tablets (12.5 mg total) by mouth 2 (two) times daily.   Multiple Vitamin (MULTIVITAMIN) tablet Take 1 tablet by mouth daily.   NAMZARIC 28-10 MG CP24 TAKE 1 CAPSULE  DAILY TO PRESERVE MEMORY   oxybutynin (DITROPAN) 5 MG tablet TAKE 1 TABLET ONE TIME DAILY FOR BLADDER CONTROL   potassium chloride SA (KLOR-CON M) 20 MEQ tablet Take 1 tablet (20 mEq total) by mouth daily.   simvastatin (ZOCOR) 40 MG tablet TAKE 1 TABLET EVERY DAY AT 6PM   [DISCONTINUED] amLODipine (NORVASC) 10 MG tablet TAKE 1 TABLET EVERY DAY FOR BLOOD PRESSURE   [DISCONTINUED] brimonidine (ALPHAGAN) 0.2 % ophthalmic solution Place 1  drop into the right eye 4 (four) times daily. (Patient not taking: Reported on 12/23/2021)   No facility-administered encounter medications on file as of 12/27/2021.    Review of Systems  Constitutional:  Negative for appetite change, chills, fatigue, fever and unexpected weight change.  Eyes:  Negative for pain, discharge, redness, itching and visual disturbance.  Respiratory:  Negative for cough, chest tightness, shortness of breath and wheezing.   Cardiovascular:  Negative for chest pain, palpitations and leg swelling.  Gastrointestinal:  Negative for abdominal distention, abdominal pain, constipation, nausea and vomiting.  Genitourinary:  Negative for  difficulty urinating, dysuria, flank pain, frequency and urgency.  Musculoskeletal:  Positive for gait problem. Negative for arthralgias, back pain, joint swelling, myalgias, neck pain and neck stiffness.  Skin:  Negative for color change, pallor and rash.  Neurological:  Negative for dizziness, weakness, light-headedness, numbness and headaches.  Psychiatric/Behavioral:  Negative for agitation, behavioral problems, confusion and sleep disturbance. The patient is not nervous/anxious.    Immunization History  Administered Date(s) Administered   Fluad Quad(high Dose 65+) 11/22/2020, 09/06/2021   Influenza, High Dose Seasonal PF 12/25/2017, 08/19/2018   Influenza,inj,Quad PF,6+ Mos 10/17/2013, 12/07/2015, 09/01/2016   Influenza-Unspecified 08/25/2008, 09/12/2011, 09/27/2012   PFIZER(Purple Top)SARS-COV-2 Vaccination 12/27/2019, 01/17/2020   Pneumococcal Conjugate-13 08/04/2014   Pneumococcal-Unspecified 05/07/2011   Tdap 05/07/2011   Pertinent  Health Maintenance Due  Topic Date Due   INFLUENZA VACCINE  Completed   DEXA SCAN  Completed   Fall Risk 11/09/2021 11/10/2021 11/11/2021 11/14/2021 12/27/2021  Falls in the past year? - - - 0 0  Was there an injury with Fall? - - - 0 0  Fall Risk Category Calculator - - - 0 0  Fall Risk Category - - - Low Low  Patient Fall Risk Level High fall risk High fall risk High fall risk Low fall risk Low fall risk  Patient at Risk for Falls Due to - - - No Fall Risks No Fall Risks  Fall risk Follow up - - - Falls evaluation completed Falls evaluation completed;Education provided;Falls prevention discussed   Functional Status Survey:    Vitals:   12/27/21 1514  BP: 136/88  Pulse: (!) 58  Temp: 98 F (36.7 C)  SpO2: 98%  Weight: 247 lb 9.6 oz (112.3 kg)  Height: _0  (1.651 m)   Body mass index is 41.2 kg/m. Physical Exam Vitals reviewed.  Constitutional:      General: She is not in acute distress.    Appearance: Normal appearance. She is  obese. She is not ill-appearing or diaphoretic.  HENT:     Head: Normocephalic.     Right Ear: Tympanic membrane, ear canal and external ear normal. There is no impacted cerumen.     Left Ear: Tympanic membrane, ear canal and external ear normal. There is no impacted cerumen.     Nose: Nose normal. No congestion or rhinorrhea.     Mouth/Throat:     Mouth: Mucous membranes are moist.     Pharynx: Oropharynx is clear. No oropharyngeal exudate or posterior oropharyngeal erythema.  Eyes:     General: No scleral icterus.       Right eye: No discharge.        Left eye: No discharge.     Extraocular Movements: Extraocular movements intact.     Conjunctiva/sclera: Conjunctivae normal.     Pupils: Pupils are equal, round, and reactive to light.  Neck:     Vascular: No carotid bruit.  Cardiovascular:  Rate and Rhythm: Normal rate and regular rhythm.     Pulses: Normal pulses.     Heart sounds: Normal heart sounds. No murmur heard.   No friction rub. No gallop.  Pulmonary:     Effort: Pulmonary effort is normal. No respiratory distress.     Breath sounds: Normal breath sounds. No wheezing, rhonchi or rales.  Chest:     Chest wall: No tenderness.  Abdominal:     General: Bowel sounds are normal. There is no distension.     Palpations: Abdomen is soft. There is no mass.     Tenderness: There is no abdominal tenderness. There is no right CVA tenderness, left CVA tenderness, guarding or rebound.  Musculoskeletal:        General: No swelling or tenderness. Normal range of motion.     Cervical back: Normal range of motion. No rigidity or tenderness.     Right lower leg: No edema.     Left lower leg: No edema.     Comments: Unsteady gait   Lymphadenopathy:     Cervical: No cervical adenopathy.  Skin:    General: Skin is warm and dry.     Coloration: Skin is not pale.     Findings: No bruising, erythema, lesion or rash.  Neurological:     Mental Status: She is alert. Mental status is at  baseline.     Cranial Nerves: No cranial nerve deficit.     Sensory: No sensory deficit.     Motor: Weakness present.     Coordination: Coordination normal.     Gait: Gait abnormal.     Comments: Right side weakness   Psychiatric:        Mood and Affect: Mood normal.        Speech: Speech normal.        Behavior: Behavior normal.    Labs reviewed: Recent Labs    11/08/21 1302 11/09/21 0004 11/10/21 0703 11/11/21 0330 11/14/21 1549  NA  --  138 138 138 142  K  --  4.2 3.6 4.1 4.3  CL  --  103 101 101 105  CO2  --  _0 GLUCOSE  --  110* 114* 92 89  BUN  --  _1 CREATININE  --  1.53* 1.50* 1.49* 1.47*  CALCIUM  --  9.3 8.9 8.9 9.8  MG 2.1 2.0  --   --   --    Recent Labs    05/02/21 0902 11/08/21 1231 11/09/21 0004  AST _2 ALT _3 ALKPHOS  --  97 81  BILITOT 0.5 1.1 0.9  PROT 7.4 7.9 6.7  ALBUMIN  --  3.9 3.4*   Recent Labs    11/08/21 1231 11/09/21 0004 11/10/21 0703 11/14/21 1549  WBC 9.9 10.0 8.0 8.7  NEUTROABS 7.6 6.0  --  4,289  HGB 14.6 12.7 13.1 14.2  HCT 45.8 38.8 41.2 43.8  MCV 90.7 86.6 88.0 87.6  PLT 207 225 226 257   Lab Results  Component Value Date   TSH 1.419 11/08/2021   Lab Results  Component Value Date   HGBA1C 5.4 05/02/2021   Lab Results  Component Value Date   CHOL 177 05/02/2021   HDL 70 05/02/2021   LDLCALC 93 05/02/2021   TRIG 57 05/02/2021   CHOLHDL 2.5 05/02/2021    Significant Diagnostic Results in last 30 days:  No results found.  Assessment/Plan  1. AF (paroxysmal atrial fibrillation) (HCC) HR controlled  - continue on Apixaban for anticoagulation  - continue on Metoprolol for HR controlled  - BMP with eGFR([facility])  2. Benign hypertension with CKD (chronic kidney disease) stage III (HCC) B/p well controlled  Continue on losartan ,metoprolol  and Hydralazine  - BMP with eGFR([facility])  Family/ staff Communication: Reviewed plan of care with patient and daughter verbalized  understanding.  Labs/tests ordered: - BMP with eGFR([facility])  Next Appointment: As needed if symptoms worsen or fail to improve    Sandrea Hughs, NP

## 2021-12-28 LAB — BASIC METABOLIC PANEL WITH GFR
BUN/Creatinine Ratio: 11 (calc) (ref 6–22)
BUN: 16 mg/dL (ref 7–25)
CO2: 32 mmol/L (ref 20–32)
Calcium: 9.9 mg/dL (ref 8.6–10.4)
Chloride: 104 mmol/L (ref 98–110)
Creat: 1.41 mg/dL — ABNORMAL HIGH (ref 0.60–1.00)
Glucose, Bld: 89 mg/dL (ref 65–99)
Potassium: 4.6 mmol/L (ref 3.5–5.3)
Sodium: 143 mmol/L (ref 135–146)
eGFR: 38 mL/min/{1.73_m2} — ABNORMAL LOW (ref >=60)

## 2022-01-13 ENCOUNTER — Other Ambulatory Visit: Payer: Self-pay

## 2022-01-13 ENCOUNTER — Ambulatory Visit: Payer: Medicare HMO | Admitting: Cardiovascular Disease

## 2022-01-13 VITALS — BP 135/84 | HR 60 | Resp 14 | Ht 64.0 in | Wt 247.0 lb

## 2022-01-13 DIAGNOSIS — Z006 Encounter for examination for normal comparison and control in clinical research program: Secondary | ICD-10-CM

## 2022-01-13 DIAGNOSIS — Z95818 Presence of other cardiac implants and grafts: Secondary | ICD-10-CM

## 2022-01-13 MED ORDER — FUROSEMIDE 20 MG PO TABS: ORAL_TABLET | ORAL | 3 refills | Status: DC

## 2022-01-13 MED ORDER — POTASSIUM CHLORIDE CRYS ER 20 MEQ PO TBCR: EXTENDED_RELEASE_TABLET | ORAL | 3 refills | Status: DC

## 2022-01-13 MED ORDER — LIDOCAINE-EPINEPHRINE 1 %-1:100000 IJ SOLN: 10.0000 mL | Freq: Once | INTRAMUSCULAR | Status: AC

## 2022-01-13 NOTE — Addendum Note (Signed)
Addended by: Rubie Maid B on: 01/13/2022 02:33 PM   Modules accepted: Orders

## 2022-01-13 NOTE — Progress Notes (Signed)
LOOP RECORDER IMPLANT   Procedure report  Procedure performed:  Loop recorder implantation   Reason for procedure:  ALLEVIATE HF clinical trial Procedure performed by:  Sanda Klein, MD  Complications:  None  Estimated blood loss:  <5 mL  Medications administered during procedure:  Lidocaine 1% with 1/10,000 epinephrine 10 mL locally Device details:  Medtronic Reveal Linq model number [id number], serial number [id number] Procedure details:  After the risks and benefits of the procedure were discussed the patient provided informed consent, including consent to participation in the ALLEVIATE-HF clinical trial. The patient was prepped and draped in usual sterile fashion. Local anesthesia was administered to an area 2 cm to the left of the sternum in the 4th intercostal space. A cutaneous incision was made using the incision tool. The introducer was then used to create a subcutaneous tunnel and carefully deploy the device. Local pressure was held to ensure hemostasis.  The incision was closed with SteriStrips and a sterile dressing was applied.  R waves 0.66 mV  Sanda Klein, MD, [facility] [phone] office [phone] pager 01/13/2022 1:35 PM

## 2022-01-13 NOTE — Patient Instructions (Signed)
Discharge Instructions for  Loop Recorder Explant/Implant    Follow up:  If you have any questions or concerns, please call the office at [phone].  ACTIVITY No restrictions. DO wear your seatbelt, even if it crosses over the site.   WOUND CARE Keep the wound area clean and dry.  Remove the dressing the day after (usually 24 hours after the procedure). DO NOT SUBMERGE UNDER WATER UNTIL FULLY HEALED (no tub baths, hot tubs, swimming pools, etc.).  You  may shower or take a sponge bath after the dressing is removed. DO NOT SOAK the area and do not allow the shower to directly spray on the site. If you have tape/steri-strips on your wound, these will fall off; do not pull them off prematurely.   No bandage is needed on the site.  DO  NOT apply any creams, oils, or ointments to the wound area. If you notice any drainage or discharge from the wound, any swelling, excessive redness or bruising at the site, or if you develop a fever > 101? F, call the office at once at [phone].

## 2022-01-13 NOTE — Addendum Note (Signed)
Addended by: Rubie Maid B on: 01/13/2022 02:04 PM   Modules accepted: Orders

## 2022-01-13 NOTE — Progress Notes (Signed)
Based on today's evaluation, current functional class is NYHA 3 (short of breath with activities of daily living/personal care).

## 2022-01-13 NOTE — Research (Addendum)
Alleviate Informed Consent   Subject Name: [name]  Subject met inclusion and exclusion criteria.  The informed consent form, study requirements and expectations were reviewed with the subject and questions and concerns were addressed prior to the signing of the consent form.  The subject verbalized understanding of the trial requirements.  The subject agreed to participate in the Alleviate trial and signed the informed consent at 1130 on 01/13/2022.  The informed consent was obtained prior to performance of any protocol-specific procedures for the subject.  A copy of the signed informed consent was given to the subject and a copy was placed in the subject's medical record.   [name]  Reviewed of history and physical by investigator.   INCLUSION _0  _1  Patient has NYHA Class II or III heart failure per most recent assessment, irrespective of left ventricular ejection fraction (LVEF)  _2  _3  Patient has documented recent history of symptomatic heart failure, defined as meeting any one of the following three criteria: 1. Hospital admission with primary diagnosis of HF within the last 12 months, OR 2. Intravenous HF therapy (e.g. IV diuretics/vasodilators) or ultrafiltration within the last 6 months, OR 3. Patient had the following BNP/NT-proBNP6 within the last 3 months: - If LVEF ? 50%, then BNP> 150 pg/ml or NT-proBNP > 450 pg/ml OR - If LVEF is <50%, then BNP> 300 pg/ml or NT-proBNP > 900 pg/ml  _4  _5  Patient is willing and able to comply with the protocol, including LINQ ICM insertion, CareLink transmissions (including adequate connectivity), study visits and remote care directions  _6  _7  Patient is 59 years of age or older  _8  _9  Patient has a life expectancy of 12 months or more    EXCLUSION _10  _11  Patient is currently implanted with a cardiovascular implantable electronic device (CIED)7 (e.g. ICM8, pacemaker, ICD, CRT-D or CRT-P device) or hemodynamic monitor   _12  _13   Patient is receiving temporary or permanent mechanical circulatory support   _14  _15  Patient had MI or PCI/CABG within past 90 days   _16  _17  Patient has had a heart transplant, or is currently on heart transplant list   _18  _19  Patient has severe valve stenosis on echocardiogram   _20  _21  Patient has primary pulmonary hypertension (pre-capillary, WHO group 1,3,4,5)   _22  _23  Patient is on chronic intravenous inotropic drug therapy (e.g. dobutamine, milrinone)   _24  _25  Patient has severe renal impairment (eGFR <30 mL/min)   _26  _27  Patient has systolic blood pressure of < 90 mmHg at the time of enrollment   _28  _29  Patient is on chronic renal dialysis   _30  _31  Patient is unable to undergo one round of PRN medication intervention (i.e. 4 days of increased diuretics dose), based on the judgement of the investigator (e.g. known history of side effects or intolerance to PRN dosing of diuretics)   _32  _33  Patient has liver disease, defined as AST/ALT >5x normal, or bilirubin >2x normal   _34  _35  Patient has serum albumin < 3 g/dL   _36  _37  Patient has hypertrophic obstructive cardiomyopathy, constrictive pericarditis or amyloidosis   _38  _39   Patient has complex adult congenital heart disease   _40  _41  Patient has active cancer involving chemotherapy and/or radiation therapy   _42  _43  Patient weighs more than 500 pounds  _44  _45  Patient is pregnant (all females of child-bearing potential must have a negative pregnancy test within 1 week of enrollment)     CURRENT FUNCTIONAL CLASS is NYHA CLASS 3.  [name] Klein, MD, [facility] [phone] office [phone] pager

## 2022-01-17 ENCOUNTER — Ambulatory Visit
Admission: RE | Admit: 2022-01-17 | Discharge: 2022-01-17 | Disposition: A | Discharge status: Home / Self Care | Payer: Medicare HMO | Source: Ambulatory Visit | Attending: Family | Admitting: Family

## 2022-01-17 ENCOUNTER — Other Ambulatory Visit: Payer: Self-pay

## 2022-01-17 DIAGNOSIS — Z1231 Encounter for screening mammogram for malignant neoplasm of breast: Secondary | ICD-10-CM

## 2022-01-21 LAB — BASIC METABOLIC PANEL
BUN/Creatinine Ratio: 10 — ABNORMAL LOW (ref 12–28)
BUN: 15 mg/dL (ref 8–27)
CO2: 20 mmol/L (ref 20–29)
Calcium: 9.4 mg/dL (ref 8.7–10.3)
Chloride: 101 mmol/L (ref 96–106)
Creatinine, Ser: 1.5 mg/dL — ABNORMAL HIGH (ref 0.57–1.00)
Glucose: 106 mg/dL — ABNORMAL HIGH (ref 70–99)
Potassium: 3.7 mmol/L (ref 3.5–5.2)
Sodium: 138 mmol/L (ref 134–144)
eGFR: 36 mL/min/{1.73_m2} — ABNORMAL LOW (ref >59)

## 2022-01-21 LAB — CBC
Hematocrit: 44.6 % (ref 34.0–46.6)
Hemoglobin: 14.6 g/dL (ref 11.1–15.9)
MCH: 28.2 pg (ref 26.6–33.0)
MCHC: 32.7 g/dL (ref 31.5–35.7)
MCV: 86 fL (ref 79–97)
Platelets: 234 10*3/uL (ref 150–450)
RBC: 5.17 x10E6/uL (ref 3.77–5.28)
RDW: 12.6 % (ref 11.7–15.4)
WBC: 6.9 10*3/uL (ref 3.4–10.8)

## 2022-01-21 LAB — BRAIN NATRIURETIC PEPTIDE

## 2022-01-21 NOTE — Progress Notes (Signed)
Please review

## 2022-01-24 ENCOUNTER — Encounter (HOSPITAL_BASED_OUTPATIENT_CLINIC_OR_DEPARTMENT_OTHER): Payer: Medicare HMO | Admitting: Cardiovascular Disease

## 2022-02-06 ENCOUNTER — Other Ambulatory Visit: Payer: Self-pay | Admitting: Family

## 2022-02-07 NOTE — Telephone Encounter (Signed)
High risk warning populated when attempting to approve refill request. Will send to provider to review.  ?

## 2022-02-18 ENCOUNTER — Telehealth: Payer: Self-pay | Admitting: *Deleted

## 2022-02-18 NOTE — Telephone Encounter (Addendum)
Prior Authorization for split night sent to [facility] via web portal. Tracking Number [id number].  Valid dates 03-07-22 --04-06-22. ? ? ?Patient is scheduled for lab study on 03-20-22. ?Patient understands her sleep study will be done at [facility] sleep lab. ?Patient understands she will receive a sleep packet in a week or so. ?Patient understands to call if she does not receive the sleep packet in a timely manner. ?Patient agrees with treatment and thanked me for call. ?

## 2022-03-03 ENCOUNTER — Other Ambulatory Visit: Payer: Medicare HMO

## 2022-03-06 ENCOUNTER — Ambulatory Visit: Payer: Medicare HMO | Admitting: Family

## 2022-03-08 ENCOUNTER — Other Ambulatory Visit: Payer: Self-pay | Admitting: Family

## 2022-03-08 DIAGNOSIS — I480 Paroxysmal atrial fibrillation: Secondary | ICD-10-CM

## 2022-03-08 DIAGNOSIS — N183 Chronic kidney disease, stage 3 unspecified: Secondary | ICD-10-CM

## 2022-03-19 ENCOUNTER — Other Ambulatory Visit: Payer: Medicare HMO

## 2022-03-19 DIAGNOSIS — E7800 Pure hypercholesterolemia, unspecified: Secondary | ICD-10-CM

## 2022-03-19 DIAGNOSIS — I129 Hypertensive chronic kidney disease with stage 1 through stage 4 chronic kidney disease, or unspecified chronic kidney disease: Secondary | ICD-10-CM | POA: Diagnosis not present

## 2022-03-19 DIAGNOSIS — N183 Chronic kidney disease, stage 3 unspecified: Secondary | ICD-10-CM | POA: Diagnosis not present

## 2022-03-20 ENCOUNTER — Encounter (HOSPITAL_BASED_OUTPATIENT_CLINIC_OR_DEPARTMENT_OTHER): Payer: Medicare HMO | Admitting: Cardiology

## 2022-03-20 LAB — COMPLETE METABOLIC PANEL WITH GFR
AG Ratio: 1.3 (calc) (ref 1.0–2.5)
ALT: 12 U/L (ref 6–29)
AST: 15 U/L (ref 10–35)
Albumin: 4 g/dL (ref 3.6–5.1)
Alkaline phosphatase (APISO): 77 U/L (ref 37–153)
BUN/Creatinine Ratio: 12 (calc) (ref 6–22)
BUN: 17 mg/dL (ref 7–25)
CO2: 29 mmol/L (ref 20–32)
Calcium: 9.9 mg/dL (ref 8.6–10.4)
Chloride: 107 mmol/L (ref 98–110)
Creat: 1.45 mg/dL — ABNORMAL HIGH (ref 0.60–1.00)
Globulin: 3.1 g/dL (calc) (ref 1.9–3.7)
Glucose, Bld: 104 mg/dL — ABNORMAL HIGH (ref 65–99)
Potassium: 4.3 mmol/L (ref 3.5–5.3)
Sodium: 144 mmol/L (ref 135–146)
Total Bilirubin: 0.6 mg/dL (ref 0.2–1.2)
Total Protein: 7.1 g/dL (ref 6.1–8.1)
eGFR: 37 mL/min/{1.73_m2} — ABNORMAL LOW (ref >=60)

## 2022-03-20 LAB — LIPID PANEL
Cholesterol: 203 mg/dL — ABNORMAL HIGH (ref <200)
HDL: 83 mg/dL (ref >=50)
LDL Cholesterol (Calc): 106 mg/dL (calc) — ABNORMAL HIGH
Non-HDL Cholesterol (Calc): 120 mg/dL (calc) (ref <130)
Total CHOL/HDL Ratio: 2.4 (calc) (ref <5.0)
Triglycerides: 62 mg/dL (ref <150)

## 2022-03-20 LAB — CBC WITH DIFFERENTIAL/PLATELET
Absolute Monocytes: 668 cells/uL (ref 200–950)
Basophils Absolute: 18 cells/uL (ref 0–200)
Basophils Relative: 0.2 %
Eosinophils Absolute: 71 cells/uL (ref 15–500)
Eosinophils Relative: 0.8 %
HCT: 46.2 % — ABNORMAL HIGH (ref 35.0–45.0)
Hemoglobin: 14.9 g/dL (ref 11.7–15.5)
Lymphs Abs: 4984 cells/uL — ABNORMAL HIGH (ref 850–3900)
MCH: 28.4 pg (ref 27.0–33.0)
MCHC: 32.3 g/dL (ref 32.0–36.0)
MCV: 88 fL (ref 80.0–100.0)
MPV: 10.1 fL (ref 7.5–12.5)
Monocytes Relative: 7.5 %
Neutro Abs: 3160 cells/uL (ref 1500–7800)
Neutrophils Relative %: 35.5 %
Platelets: 261 10*3/uL (ref 140–400)
RBC: 5.25 10*6/uL — ABNORMAL HIGH (ref 3.80–5.10)
RDW: 12.3 % (ref 11.0–15.0)
Total Lymphocyte: 56 %
WBC: 8.9 10*3/uL (ref 3.8–10.8)

## 2022-03-20 LAB — TSH: TSH: 1.56 mIU/L (ref 0.40–4.50)

## 2022-03-21 ENCOUNTER — Encounter: Payer: Self-pay | Admitting: Family

## 2022-03-21 ENCOUNTER — Telehealth: Payer: Self-pay

## 2022-03-21 ENCOUNTER — Ambulatory Visit (INDEPENDENT_AMBULATORY_CARE_PROVIDER_SITE_OTHER): Payer: Medicare HMO | Admitting: Family

## 2022-03-21 VITALS — BP 138/86 | HR 70 | Temp 97.4°F | Ht 64.0 in | Wt 252.0 lb

## 2022-03-21 DIAGNOSIS — F0150 Vascular dementia without behavioral disturbance: Secondary | ICD-10-CM

## 2022-03-21 DIAGNOSIS — E782 Mixed hyperlipidemia: Secondary | ICD-10-CM | POA: Diagnosis not present

## 2022-03-21 DIAGNOSIS — W19XXXA Unspecified fall, initial encounter: Secondary | ICD-10-CM | POA: Diagnosis not present

## 2022-03-21 DIAGNOSIS — R2681 Unsteadiness on feet: Secondary | ICD-10-CM

## 2022-03-21 DIAGNOSIS — Z23 Encounter for immunization: Secondary | ICD-10-CM

## 2022-03-21 DIAGNOSIS — I129 Hypertensive chronic kidney disease with stage 1 through stage 4 chronic kidney disease, or unspecified chronic kidney disease: Secondary | ICD-10-CM | POA: Diagnosis not present

## 2022-03-21 DIAGNOSIS — N183 Chronic kidney disease, stage 3 unspecified: Secondary | ICD-10-CM

## 2022-03-21 DIAGNOSIS — I69351 Hemiplegia and hemiparesis following cerebral infarction affecting right dominant side: Secondary | ICD-10-CM | POA: Diagnosis not present

## 2022-03-21 DIAGNOSIS — I480 Paroxysmal atrial fibrillation: Secondary | ICD-10-CM

## 2022-03-21 DIAGNOSIS — I6990 Unspecified sequelae of unspecified cerebrovascular disease: Secondary | ICD-10-CM | POA: Diagnosis not present

## 2022-03-21 DIAGNOSIS — Z006 Encounter for examination for normal comparison and control in clinical research program: Secondary | ICD-10-CM

## 2022-03-21 MED ORDER — TETANUS-DIPHTH-ACELL PERTUSSIS 5-2.5-18.5 LF-MCG/0.5 IM SUSP: 0.5000 mL | Freq: Once | INTRAMUSCULAR | 0 refills | Status: AC

## 2022-03-21 NOTE — Telephone Encounter (Signed)
Can I fax over the AVS if the office note isnt complete?  ?

## 2022-03-21 NOTE — Progress Notes (Signed)
? ?Provider: Marlowe Sax FNP-C  ? ?Franklyn Cafaro, Nelda Bucks, NP ? ?Patient Care Team: ?Tahirih Lair, Nelda Bucks, NP as PCP - General (Family Medicine) ?[name], MD as Consulting Physician (Ophthalmology) ? ?Extended Emergency Contact Information ?Primary Emergency Contact: Gores,[name] ?Address: [address]         [place], [place] [address] [place] ?Home Phone: [phone] ?Mobile Phone: [phone] ?Relation: Daughter ? ?Code Status:  Full Code  ?Goals of care: Advanced Directive information ? ?  03/21/2022  ?  1:03 PM  ?Advanced Directives  ?Does Patient Have a Medical Advance Directive? Yes  ?Type of Advance Directive Healthcare Power of Attorney  ?Does patient want to make changes to medical advance directive? No - Patient declined  ?Copy of [place] in Chart? No - copy requested   ?  ? Significant value  ? ? ? ?Chief Complaint  ?Patient presents with  ? Medical Management of Chronic Issues  ?  6 month follow up/Review recent labs. Patient is present with daughter, [name].  ? Immunizations  ?  Discuss the need for Shingrix vaccine, Pne vaccine, Tetanus vaccine, and Covid Booster.  ? ? ?HPI:  ?Pt is a 76 y.o. female seen today for 6 months follow-up for medical management of chronic diseases. 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 ?Recent lab work discussed and reviewed with patient and daughter during visit.Labs unremarkable except glucose was 104, creatinine stable at 1.45 previous was 1.50.  RBCs hematocrit and lymph abs slightly elevated will recheck in a month to closely monitor. She denies any new acute issues today. ?Daughter request a new rolling  walker.She will require DME Rolling to allow her to maintain current level of independence with ADL's which cannot be achieved with cane. 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 ? ?Health maintenance: ?Immunization reviewed she is due for Shingrix vaccine, pneumococcal 13, tetanus and COVID booster vaccine.Have discussed with daughter to get the Shingrix tetanus and COVID booster vaccine at her pharmacy.Will administer Prevnar 23 today. ? ?Past Medical History:  ?Diagnosis Date  ? Cataract   ? slow growing   ? Chronic kidney disease   ? Hyperlipidemia   ? Hypertension   ? Insomnia, unspecified   ? Memory loss   ? mild dementia per daughter   ? Osteopenia   ? Other abnormal blood chemistry   ? Other specified cardiac dysrhythmias(427.89)   ? Other specified disease of nail   ? Stroke [facility])   ? 2006 or 2007 per daughter   ? Syncope and collapse   ? Unspecified disorder of kidney and ureter   ? Unspecified late effects of cerebrovascular disease   ? Unspecified transient cerebral ischemia   ? Unspecified urinary incontinence   ? Unspecified vitamin D deficiency   ? Vascular dementia, uncomplicated ([place])   ? ?Past Surgical History:  ?Procedure Laterality Date  ? NO PAST SURGERIES    ? ? ?No Known Allergies ? ?Allergies as of 03/21/2022   ?No Known Allergies ?  ? ?  ?Medication List  ?  ? ?  ? Accurate as of  March 21, 2022  1:19 PM. If you have any questions, ask your nurse or doctor.  ?  ?  ? ?  ? ?apixaban 5 MG Tabs tablet ?Commonly known as: ELIQUIS ?Take 1 tablet (5 mg total) by mouth 2 (two) times daily. ?  ?cholecalciferol 1000 units tablet ?Commonly known as: VITAMIN D ?Take 1,000 Units by mouth daily. ?  ?furosemide 20 MG tablet ?Commonly known as: Lasix ?Take 1 tablet (20 mg total) by mouth  daily. ?  ?furosemide 20 MG tablet ?Commonly known as: LASIX ?As needed patient may take 20 MG Lasix PRN by mouth QD x 4 days as directed per Alleviate Research HF Study PRN plan. ? ?DO NOT REMOVE ?  ?hydrALAZINE 25 MG tablet ?Commonly known as: APRESOLINE ?TAKE 1 TABLET THREE TIMES DAILY WITH MEALS TO CONTROL BLOOD PRESSURE ?  ?losartan 50 MG tablet ?Commonly known as: COZAAR ?Take 1/2 tablet (25 mg total) by mouth daily. ?  ?metoprolol tartrate 25 MG tablet ?Commonly known as: LOPRESSOR ?TAKE 1/2 TABLET TWICE DAILY ?  ?multivitamin tablet ?Take 1 tablet by mouth daily. ?  ?Namzaric 28-10 MG Cp24 ?Generic drug: Memantine HCl-Donepezil HCl ?TAKE 1 CAPSULE  DAILY TO PRESERVE MEMORY ?  ?oxybutynin 5 MG tablet ?Commonly known as: DITROPAN ?TAKE 1 TABLET ONE TIME DAILY FOR BLADDER CONTROL ?  ?potassium chloride SA 20 MEQ tablet ?Commonly known as: KLOR-CON M ?As needed patient may take 20 mEq Potassium PRN by mouth QD x 4 days as directed per Alleviate Research HF Study PRN plan. ? ?DO NOT REMOVE ORDER ?  ?potassium chloride SA 20 MEQ tablet ?Commonly known as: KLOR-CON M ?TAKE 1 TABLET EVERY DAY ?  ?simvastatin 40 MG tablet ?Commonly known as: ZOCOR ?TAKE 1 TABLET EVERY DAY AT 6PM ?  ? ?  ? ? ?Review of Systems  ?Constitutional:  Negative for appetite change, chills, fatigue, fever and unexpected weight change.  ?HENT:  Negative for congestion, dental problem, ear discharge, ear pain, facial swelling, hearing loss, nosebleeds, postnasal drip, rhinorrhea, sinus pressure, sinus pain, sneezing, sore throat, tinnitus and trouble swallowing.   ?Eyes:  Negative for pain, discharge, redness, itching and visual disturbance.  ?Respiratory:  Negative for cough, chest tightness, shortness of breath and wheezing.   ?Cardiovascular:  Negative for chest pain, palpitations and leg swelling.  ?Gastrointestinal:  Negative for abdominal distention, abdominal pain, blood in stool, constipation, diarrhea, nausea and vomiting.  ?Endocrine:  Negative for cold intolerance, heat intolerance, polydipsia, polyphagia and polyuria.  ?Genitourinary:  Negative for difficulty urinating, dysuria, flank pain, frequency and urgency.  ?Musculoskeletal:  Positive for gait problem. Negative for arthralgias, back pain, joint swelling, myalgias, neck pain and neck stiffness.  ?Skin:  Negative for color change, pallor, rash and wound.  ?Neurological:  Positive for weakness. Negative for dizziness, syncope, speech difficulty, light-headedness, numbness and headaches.  ?     Right side weakness   ?Hematological:  Does not bruise/bleed easily.  ?Psychiatric/Behavioral:  Negative for agitation, behavioral problems, confusion, hallucinations, self-injury, sleep disturbance and suicidal ideas. The patient is not nervous/anxious.   ? ?Immunization History  ?Administered Date(s) Administered  ? Fluad Quad(high Dose 65+) 11/22/2020, 09/06/2021  ? Influenza, High Dose Seasonal PF 12/25/2017, 08/19/2018  ? Influenza,inj,Quad PF,6+ Mos 10/17/2013, 12/07/2015, 09/01/2016  ? Influenza-Unspecified 08/25/2008, 09/12/2011, 09/27/2012  ? PFIZER(Purple Top)SARS-COV-2 Vaccination 12/27/2019, 01/17/2020  ? Pneumococcal Conjugate-13 08/04/2014  ? Pneumococcal-Unspecified 05/07/2011  ? Tdap 05/07/2011  ? ?Pertinent  Health Maintenance Due  ?Topic Date Due  ? INFLUENZA VACCINE  07/08/2022  ? DEXA SCAN  Completed  ? ? ?  11/10/2021  ?  9:00 AM 11/11/2021  ?  9:15 AM 11/14/2021  ?  3:03 PM 12/27/2021  ?  3:23 PM 03/21/2022  ?  1:02 PM  ?Fall Risk  ?Falls in the past year?   0 0 1  ?Was there an injury with Fall?   0 0 0  ?Fall Risk Category Calculator   0 0 1  ?Fall Risk Category   Low Low Low  ?Patient Fall Risk Level High fall risk High fall risk Low fall risk Low fall risk Low fall risk  ?Patient at Risk for Falls Due to   No Fall Risks No Fall Risks No Fall Risks  ?Fall risk Follow up   Falls evaluation completed Falls evaluation completed;Education provided;Falls prevention discussed Falls  evaluation completed  ? ?Functional Status Survey: ?  ? ?Vitals:  ? 03/21/22 1300  ?BP: 138/86  ?Pulse: 70  ?Temp: (!) 97.4 ?F (36.3 ?C)  ?TempSrc: Temporal  ?SpO2: 94%  ?Weight: 252 lb (114.3 kg)  ?Heigh

## 2022-03-21 NOTE — Telephone Encounter (Signed)
Just saw patient today fax notes on Monday when it's completed.  ?

## 2022-03-21 NOTE — Telephone Encounter (Signed)
Lurline Idol with [facility] called and stated she received order for walker, but office visit notes needs to be faxed over. ? ?Message routed to Marlowe Sax, NP ?

## 2022-03-25 ENCOUNTER — Telehealth: Payer: Self-pay | Admitting: *Deleted

## 2022-03-25 NOTE — Telephone Encounter (Signed)
Andrienne with [facility] called and stated that they received the order for patient's rolling Walker but they need supporting OV note to get it covered through insurance.  ? ?Needs Supporting OV Note faxed to [employer] at Fax:[phone] ?(Note to be completed) ?Forwarded to [employer].  ?

## 2022-03-26 NOTE — Telephone Encounter (Signed)
Completed.

## 2022-03-26 NOTE — Telephone Encounter (Signed)
OV Note Dated 03/21/2022 Faxed to [facility] Fax:[phone] ?

## 2022-03-26 NOTE — Telephone Encounter (Signed)
May fax visit notes to [facility]  ?

## 2022-03-27 NOTE — Research (Signed)
Alleviate 2 Month ? ? ? ?Were new reportable adverse events identified that were not previously reported               ? YES '[]'$     NO '[x]'$  ? ?Were reportable medication changes identified ?YES '[]'$  NO'[x]'$  ? ? ? ?Current Outpatient Medications:  ?  apixaban (ELIQUIS) 5 MG TABS tablet, Take 1 tablet (5 mg total) by mouth 2 (two) times daily., Disp: 56 tablet, Rfl: 0 ?  cholecalciferol (VITAMIN D) 1000 units tablet, Take 1,000 Units by mouth daily., Disp: , Rfl:  ?  furosemide (LASIX) 20 MG tablet, Take 1 tablet (20 mg total) by mouth daily., Disp: 360 tablet, Rfl: 0 ?  furosemide (LASIX) 20 MG tablet, As needed patient may take 20 MG Lasix PRN by mouth QD x 4 days as directed per Alleviate Research HF Study PRN plan.  DO NOT REMOVE, Disp: 90 tablet, Rfl: 3 ?  hydrALAZINE (APRESOLINE) 25 MG tablet, TAKE 1 TABLET THREE TIMES DAILY WITH MEALS TO CONTROL BLOOD PRESSURE, Disp: 270 tablet, Rfl: 2 ?  losartan (COZAAR) 50 MG tablet, Take 1/2 tablet (25 mg total) by mouth daily., Disp: 30 tablet, Rfl: 1 ?  metoprolol tartrate (LOPRESSOR) 25 MG tablet, TAKE 1/2 TABLET TWICE DAILY, Disp: 90 tablet, Rfl: 2 ?  Multiple Vitamin (MULTIVITAMIN) tablet, Take 1 tablet by mouth daily., Disp: , Rfl:  ?  NAMZARIC 28-10 MG CP24, TAKE 1 CAPSULE  DAILY TO PRESERVE MEMORY, Disp: 90 capsule, Rfl: 1 ?  oxybutynin (DITROPAN) 5 MG tablet, TAKE 1 TABLET ONE TIME DAILY FOR BLADDER CONTROL, Disp: 90 tablet, Rfl: 2 ?  potassium chloride SA (KLOR-CON M) 20 MEQ tablet, As needed patient may take 20 mEq Potassium PRN by mouth QD x 4 days as directed per Alleviate Research HF Study PRN plan.  DO NOT REMOVE ORDER, Disp: 90 tablet, Rfl: 3 ?  potassium chloride SA (KLOR-CON M) 20 MEQ tablet, TAKE 1 TABLET EVERY DAY, Disp: 90 tablet, Rfl: 1 ?  simvastatin (ZOCOR) 40 MG tablet, TAKE 1 TABLET EVERY DAY AT 6PM, Disp: 90 tablet, Rfl: 3 ? ?Current Facility-Administered Medications:  ?  lidocaine-EPINEPHrine (XYLOCAINE W/EPI) 1 %-1:100000 (with pres) injection 10 mL,  10 mL, Infiltration, Once, Croitoru, Mihai, MD  ? ? ? ? ?

## 2022-04-28 ENCOUNTER — Ambulatory Visit
Admission: RE | Admit: 2022-04-28 | Discharge: 2022-04-28 | Disposition: A | Discharge status: Home / Self Care | Payer: Medicare HMO | Source: Ambulatory Visit | Attending: Family | Admitting: Family

## 2022-04-28 DIAGNOSIS — Z780 Asymptomatic menopausal state: Secondary | ICD-10-CM | POA: Diagnosis not present

## 2022-04-28 DIAGNOSIS — M85852 Other specified disorders of bone density and structure, left thigh: Secondary | ICD-10-CM

## 2022-04-28 DIAGNOSIS — M8589 Other specified disorders of bone density and structure, multiple sites: Secondary | ICD-10-CM | POA: Diagnosis not present

## 2022-04-28 DIAGNOSIS — M810 Age-related osteoporosis without current pathological fracture: Secondary | ICD-10-CM | POA: Diagnosis not present

## 2022-05-12 ENCOUNTER — Ambulatory Visit (INDEPENDENT_AMBULATORY_CARE_PROVIDER_SITE_OTHER): Payer: Medicare HMO | Admitting: Family

## 2022-05-12 ENCOUNTER — Encounter: Payer: Self-pay | Admitting: Family

## 2022-05-12 VITALS — BP 136/80 | HR 54 | Temp 97.1°F | Resp 18 | Ht 64.0 in | Wt 252.0 lb

## 2022-05-12 DIAGNOSIS — M810 Age-related osteoporosis without current pathological fracture: Secondary | ICD-10-CM | POA: Diagnosis not present

## 2022-05-12 MED ORDER — ALENDRONATE SODIUM 70 MG PO TABS: 70.0000 mg | ORAL_TABLET | ORAL | 11 refills | Status: DC

## 2022-05-12 NOTE — Patient Instructions (Signed)

## 2022-05-12 NOTE — Progress Notes (Signed)
Provider: Marlowe Sax FNP-C  Del Wiseman, Nelda Bucks, NP  Patient Care Team: Argenis Kumari, Nelda Bucks, NP as PCP - General (Family Medicine) [name], MD as Consulting Physician (Ophthalmology)  Extended Emergency Contact Information Primary Emergency Contact: [name],[name] Address: [address] [place] [address] [name] Phone: [phone] Mobile Phone: [phone] Relation: Daughter  Code Status:  Full Code  Goals of care: Advanced Directive information    05/12/2022    1:44 PM  Advanced Directives  Does Patient Have a Medical Advance Directive? Yes  Type of Advance Directive [place]  Does patient want to make changes to medical advance directive? No - Patient declined  Copy of [place] in Chart? No - copy requested     Chief Complaint  Patient presents with   Acute Visit    Discuss Dexa Scan, and Osteoporosis medications.    HPI:  Pt is a 76 y.o. female seen today for an acute visit to discuss bone density results.Her bone density results done 04/28/2022 indicated Osteoporosis on forearm Radius T-score -3.1. She is here with daughter.No recent bone fracture.Daughter states patient fell about a week ago while walking from the bathroom.she did not hit her head.she was assisted up by daughter and walked without any pain.    Past Medical History:  Diagnosis Date   Cataract    slow growing    Chronic kidney disease    Hyperlipidemia    Hypertension    Insomnia, unspecified    Memory loss    mild dementia per daughter    Osteopenia    Other abnormal blood chemistry    Other specified cardiac dysrhythmias(427.89)    Other specified disease of nail    Stroke ([place])    2006 or 2007 per daughter    Syncope and collapse    Unspecified disorder of kidney and ureter    Unspecified late effects of cerebrovascular disease    Unspecified transient cerebral ischemia    Unspecified urinary  incontinence    Unspecified vitamin D deficiency    Vascular dementia, uncomplicated ([place])    Past Surgical History:  Procedure Laterality Date   NO PAST SURGERIES      No Known Allergies  Outpatient Encounter Medications as of 05/12/2022  Medication Sig   alendronate (FOSAMAX) 70 MG tablet Take 1 tablet (70 mg total) by mouth every 7 (seven) days. Take with a full glass of water on an empty stomach.   apixaban (ELIQUIS) 5 MG TABS tablet Take 1 tablet (5 mg total) by mouth 2 (two) times daily.   Calcium Carb-Cholecalciferol (CALCIUM 600+D3 PO) Take 1 tablet by mouth 2 (two) times daily.   cholecalciferol (VITAMIN D) 1000 units tablet Take 1,000 Units by mouth daily.   furosemide (LASIX) 20 MG tablet Take 1 tablet (20 mg total) by mouth daily.   furosemide (LASIX) 20 MG tablet As needed patient may take 20 MG Lasix PRN by mouth QD x 4 days as directed per [facility] HF Study PRN plan.  DO NOT REMOVE   hydrALAZINE (APRESOLINE) 25 MG tablet TAKE 1 TABLET THREE TIMES DAILY WITH MEALS TO CONTROL BLOOD PRESSURE   losartan (COZAAR) 50 MG tablet Take 1/2 tablet (25 mg total) by mouth daily.   metoprolol tartrate (LOPRESSOR) 25 MG tablet TAKE 1/2 TABLET TWICE DAILY   Multiple Vitamin (MULTIVITAMIN) tablet Take 1 tablet by mouth daily.   NAMZARIC 28-10 MG CP24 TAKE  1 CAPSULE  DAILY TO PRESERVE MEMORY   oxybutynin (DITROPAN) 5 MG tablet TAKE 1 TABLET ONE TIME DAILY FOR BLADDER CONTROL   potassium chloride SA (KLOR-CON M) 20 MEQ tablet As needed patient may take 20 mEq Potassium PRN by mouth QD x 4 days as directed per [facility] HF Study PRN plan.  DO NOT REMOVE ORDER   potassium chloride SA (KLOR-CON M) 20 MEQ tablet TAKE 1 TABLET EVERY DAY   simvastatin (ZOCOR) 40 MG tablet TAKE 1 TABLET EVERY DAY AT 6PM   Facility-Administered Encounter Medications as of 05/12/2022  Medication   lidocaine-EPINEPHrine (XYLOCAINE W/EPI) 1 %-1:100000 (with pres) injection 10 mL    Review of  Systems  Constitutional:  Negative for appetite change, chills, fatigue and fever.  HENT:  Negative for congestion, dental problem, rhinorrhea, sinus pressure, sinus pain, sneezing, sore throat and trouble swallowing.   Respiratory:  Negative for cough, chest tightness, shortness of breath and wheezing.   Cardiovascular:  Negative for chest pain, palpitations and leg swelling.  Gastrointestinal:  Negative for abdominal distention, abdominal pain, constipation, diarrhea, nausea and vomiting.  Musculoskeletal:  Positive for gait problem.  Skin:  Negative for color change, pallor and rash.  Neurological:  Negative for dizziness, speech difficulty and headaches.   Immunization History  Administered Date(s) Administered   Fluad Quad(high Dose 65+) 11/22/2020, 09/06/2021   Influenza, High Dose Seasonal PF 12/25/2017, 08/19/2018   Influenza,inj,Quad PF,6+ Mos 10/17/2013, 12/07/2015, 09/01/2016   Influenza-Unspecified 08/25/2008, 09/12/2011, 09/27/2012   PFIZER(Purple Top)SARS-COV-2 Vaccination 12/27/2019, 01/17/2020   Pneumococcal Conjugate-13 08/04/2014   Pneumococcal Polysaccharide-23 03/21/2022   Pneumococcal-Unspecified 05/07/2011   Tdap 05/07/2011   Pertinent  Health Maintenance Due  Topic Date Due   INFLUENZA VACCINE  07/08/2022   DEXA SCAN  Completed      11/11/2021    9:15 AM 11/14/2021    3:03 PM 12/27/2021    3:23 PM 03/21/2022    1:02 PM 05/12/2022    1:43 PM  Fall Risk  Falls in the past year?  0 0 1 1  Was there an injury with Fall?  0 0 0 0  Fall Risk Category Calculator  0 0 1 1  Fall Risk Category  Low Low Low Low  Patient Fall Risk Level High fall risk Low fall risk Low fall risk Low fall risk Low fall risk  Patient at Risk for Falls Due to  No Fall Risks No Fall Risks No Fall Risks History of fall(s)  Fall risk Follow up  Falls evaluation completed Falls evaluation completed;Education provided;Falls prevention discussed Falls evaluation completed Falls evaluation  completed;Education provided;Falls prevention discussed   Functional Status Survey:    Vitals:   05/12/22 1332  BP: 136/80  Pulse: (!) 54  Resp: 18  Temp: (!) 97.1 F (36.2 C)  SpO2: 93%  Weight: 252 lb (114.3 kg)  Height: '5\' 4"'$  (1.626 m)   Body mass index is 43.26 kg/m. Physical Exam Vitals reviewed.  Constitutional:      General: She is not in acute distress.    Appearance: Normal appearance. She is normal weight. She is not ill-appearing or diaphoretic.  Cardiovascular:     Rate and Rhythm: Normal rate and regular rhythm.     Pulses: Normal pulses.     Heart sounds: Normal heart sounds. No murmur heard.   No friction rub. No gallop.  Pulmonary:     Effort: Pulmonary effort is normal. No respiratory distress.     Breath sounds: Normal breath sounds.  No wheezing, rhonchi or rales.  Chest:     Chest wall: No tenderness.  Abdominal:     General: Bowel sounds are normal. There is no distension.     Palpations: Abdomen is soft. There is no mass.     Tenderness: There is no abdominal tenderness. There is no right CVA tenderness, left CVA tenderness, guarding or rebound.  Musculoskeletal:        General: No swelling or tenderness. Normal range of motion.     Right lower leg: No edema.     Left lower leg: No edema.     Comments: Gait steady with Rolator   Skin:    General: Skin is warm and dry.     Coloration: Skin is not pale.     Findings: No erythema or rash.  Neurological:     Mental Status: She is alert and oriented to person, place, and time.     Gait: Gait abnormal.  Psychiatric:        Mood and Affect: Mood normal.        Speech: Speech normal.        Behavior: Behavior normal.        Judgment: Judgment normal.    Labs reviewed: Recent Labs    11/08/21 1302 11/09/21 0004 11/10/21 0703 12/27/21 1607 01/13/22 1300 03/19/22 0907  NA  --  138   < > 143 138 144  K  --  4.2   < > 4.6 3.7 4.3  CL  --  103   < > 104 101 107  CO2  --  26   < > 32 20 29   GLUCOSE  --  110*   < > 89 106* 104*  BUN  --  18   < > '16 15 17  '$ CREATININE  --  1.53*   < > 1.41* 1.50* 1.45*  CALCIUM  --  9.3   < > 9.9 9.4 9.9  MG 2.1 2.0  --   --   --   --    < > = values in this interval not displayed.   Recent Labs    11/08/21 1231 11/09/21 0004 03/19/22 0907  AST '25 21 15  '$ ALT '25 20 12  '$ ALKPHOS 97 81  --   BILITOT 1.1 0.9 0.6  PROT 7.9 6.7 7.1  ALBUMIN 3.9 3.4*  --    Recent Labs    11/09/21 0004 11/10/21 0703 11/14/21 1549 01/13/22 1300 03/19/22 0907  WBC 10.0   < > 8.7 6.9 8.9  NEUTROABS 6.0  --  4,289  --  3,160  HGB 12.7   < > 14.2 14.6 14.9  HCT 38.8   < > 43.8 44.6 46.2*  MCV 86.6   < > 87.6 86 88.0  PLT 225   < > 257 234 261   < > = values in this interval not displayed.   Lab Results  Component Value Date   TSH 1.56 03/19/2022   Lab Results  Component Value Date   HGBA1C 5.4 05/02/2021   Lab Results  Component Value Date   CHOL 203 (H) 03/19/2022   HDL 83 03/19/2022   LDLCALC 106 (H) 03/19/2022   TRIG 62 03/19/2022   CHOLHDL 2.4 03/19/2022    Significant Diagnostic Results in last 30 days:  DG Bone Density  Result Date: 04/28/2022 EXAM: DUAL X-RAY ABSORPTIOMETRY (DXA) FOR BONE MINERAL DENSITY IMPRESSION: Referring Physician:  Sandrea Hughs Your patient completed a bone mineral density  test using [employer] system (analysis version: 16). Technologist: [place] PATIENT: Name: [name], [name] Patient ID: [id number] Birth Date: 27-May-1946 Height: 63.0 in. Sex: Female Measured: 04/28/2022 Weight: 260.0 lbs. Indications: Advanced Age, Estrogen Deficient, Family Hist. (Parent hip fracture), Postmenopausal Fractures: None Treatments: Vitamin D (E933.5) ASSESSMENT: The BMD measured at Forearm Radius 33% is 0.607 g/cm2 with a T-score of -3.1. This patient is considered osteoporotic according to [facility]) criteria. The quality of the exam is limited by patient's body habitus and cognitive impairment. L1 was excluded  due to degenerative changes. Site Region Measured Date Measured Age YA BMD Significant CHANGE T-score Right Forearm Radius 33% 04/28/20[age].0 -3.1 0.607 g/cm2 AP Spine L2-L4 04/28/2022 78.0 -1.1 1.085 g/cm2 * AP Spine L2-L4 10/05/2017 73.4 -1.4 1.048 g/cm2 DualFemur Total Right 04/28/2022 78.0 -1.9 0.765 g/cm2 DualFemur Total Right 10/05/2017 73.4 -1.9 0.770 g/cm2 DualFemur Total Mean 04/28/2022 78.0 -2.0 0.751 g/cm2 DualFemur Total Mean 10/05/2017 73.4 -2.1 0.740 g/cm2 World Health Organization [facility]) criteria for post-menopausal, Caucasian Women: Normal       T-score at or above -1 SD Osteopenia   T-score between -1 and -2.5 SD Osteoporosis T-score at or below -2.5 SD RECOMMENDATION: 1. All patients should optimize calcium and vitamin D intake. 2. Consider FDA-approved medical therapies in postmenopausal women and men aged 71 years and older, based on the following: a. A hip or vertebral (clinical or morphometric) fracture. b. T-score = -2.5 at the femoral neck or spine after appropriate evaluation to exclude secondary causes. c. Low bone mass (T-score between -1.0 and -2.5 at the femoral neck or spine) and a 10-year probability of a hip fracture = 3% or a 10-year probability of a major osteoporosis-related fracture = 20% based on the [place]-adapted WHO algorithm. d. Clinician judgment and/or patient preferences may indicate treatment for people with 10-year fracture probabilities above or below these levels. FOLLOW-UP: Patients with diagnosis of osteoporosis or at high risk for fracture should have regular bone mineral density tests.? Patients eligible for Medicare are allowed routine testing every 2 years.? The testing frequency can be increased to one year for patients who have rapidly progressing disease, are receiving or discontinuing medical therapy to restore bone mass, or have additional risk factors. I have reviewed this study and agree with the findings. [facility] Electronically Signed   By:  Elmer Picker M.D.   On: 04/28/2022 13:51    Assessment/Plan  Osteoporosis without current pathological fracture, unspecified osteoporosis type bone density results done 04/28/2022 indicated Osteoporosis on forearm Radius T-score -3.1.treatment option discussed.Has Already started on calcium and vitamin D supplement  - agrees to start on alendronate.Side effects discussed.Advised to take on empty stomach with  full glasses of water.advised to remain in upright position for 30 minutes after taking alendronate. - alendronate (FOSAMAX) 70 MG tablet; Take 1 tablet (70 mg total) by mouth every 7 (seven) days. Take with a full glass of water on an empty stomach.  Dispense: 4 tablet; Refill: 11 - will recheck Bone density in 2 yrs  Family/ staff Communication: Reviewed plan of care with patient and daughter verbalized understanding.   Labs/tests ordered: None   Next Appointment: Has appointment in place   Sandrea Hughs, NP

## 2022-05-13 DIAGNOSIS — Z006 Encounter for examination for normal comparison and control in clinical research program: Secondary | ICD-10-CM

## 2022-05-13 NOTE — Research (Signed)
Alleviate HF Research  21Month  No adverse events or medication changes to report.   Current Outpatient Medications:    alendronate (FOSAMAX) 70 MG tablet, Take 1 tablet (70 mg total) by mouth every 7 (seven) days. Take with a full glass of water on an empty stomach., Disp: 4 tablet, Rfl: 11   apixaban (ELIQUIS) 5 MG TABS tablet, Take 1 tablet (5 mg total) by mouth 2 (two) times daily., Disp: 56 tablet, Rfl: 0   Calcium Carb-Cholecalciferol (CALCIUM 600+D3 PO), Take 1 tablet by mouth 2 (two) times daily., Disp: , Rfl:    cholecalciferol (VITAMIN D) 1000 units tablet, Take 1,000 Units by mouth daily., Disp: , Rfl:    furosemide (LASIX) 20 MG tablet, Take 1 tablet (20 mg total) by mouth daily., Disp: 360 tablet, Rfl: 0   furosemide (LASIX) 20 MG tablet, As needed patient may take 20 MG Lasix PRN by mouth QD x 4 days as directed per [facility] Research HF Study PRN plan.  DO NOT REMOVE, Disp: 90 tablet, Rfl: 3   hydrALAZINE (APRESOLINE) 25 MG tablet, TAKE 1 TABLET THREE TIMES DAILY WITH MEALS TO CONTROL BLOOD PRESSURE, Disp: 270 tablet, Rfl: 2   losartan (COZAAR) 50 MG tablet, Take 1/2 tablet (25 mg total) by mouth daily., Disp: 30 tablet, Rfl: 1   metoprolol tartrate (LOPRESSOR) 25 MG tablet, TAKE 1/2 TABLET TWICE DAILY, Disp: 90 tablet, Rfl: 2   Multiple Vitamin (MULTIVITAMIN) tablet, Take 1 tablet by mouth daily., Disp: , Rfl:    NAMZARIC 28-10 MG CP24, TAKE 1 CAPSULE  DAILY TO PRESERVE MEMORY, Disp: 90 capsule, Rfl: 1   oxybutynin (DITROPAN) 5 MG tablet, TAKE 1 TABLET ONE TIME DAILY FOR BLADDER CONTROL, Disp: 90 tablet, Rfl: 2   potassium chloride SA (KLOR-CON M) 20 MEQ tablet, As needed patient may take 20 mEq Potassium PRN by mouth QD x 4 days as directed per [facility] Research HF Study PRN plan.  DO NOT REMOVE ORDER, Disp: 90 tablet, Rfl: 3   potassium chloride SA (KLOR-CON M) 20 MEQ tablet, TAKE 1 TABLET EVERY DAY, Disp: 90 tablet, Rfl: 1   simvastatin (ZOCOR) 40 MG tablet, TAKE 1 TABLET  EVERY DAY AT 6PM, Disp: 90 tablet, Rfl: 3  Current Facility-Administered Medications:    lidocaine-EPINEPHrine (XYLOCAINE W/EPI) 1 %-1:100000 (with pres) injection 10 mL, 10 mL, Infiltration, Once, Croitoru, Mihai, MD

## 2022-05-19 ENCOUNTER — Other Ambulatory Visit (HOSPITAL_COMMUNITY): Payer: Self-pay | Admitting: Physician Assistant

## 2022-05-20 NOTE — Telephone Encounter (Signed)
Prescription refill request for Eliquis received.  Indication: afib  Last office visit: Croitoru, 01/13/2022 Scr: 1.45, 03/19/2022 Age: 76 yo  Weight: 114.3 kg   Refill sent.

## 2022-06-05 ENCOUNTER — Ambulatory Visit (INDEPENDENT_AMBULATORY_CARE_PROVIDER_SITE_OTHER): Payer: Medicare HMO | Admitting: Family

## 2022-06-05 ENCOUNTER — Telehealth: Payer: Self-pay | Admitting: *Deleted

## 2022-06-05 DIAGNOSIS — Z0000 Encounter for general adult medical examination without abnormal findings: Secondary | ICD-10-CM | POA: Diagnosis not present

## 2022-06-05 NOTE — Patient Instructions (Signed)
[name] , Thank you for taking time to come for your Medicare Wellness Visit. I appreciate your ongoing commitment to your health goals. Please review the following plan we discussed and let me know if I can assist you in the future.   Screening recommendations/referrals: Colonoscopy: N/A  Mammogram : Up to date  Bone Density : Up to date  Recommended yearly ophthalmology/optometry visit for glaucoma screening and checkup Recommended yearly dental visit for hygiene and checkup  Vaccinations: Influenza vaccine : Up to date  Pneumococcal vaccine : Up to date  Tdap vaccine Due please get vaccine at your pharmacy Shingles vaccine Due Shingrix vaccine at your pharmacy  Advanced directives: Yes  Conditions/risks identified: Advanced age woman> 68 , hypertension, history of smoking, obesity BMI greater than 30 and sedentary lifestyle  Next appointment: 1 year   Preventive Care 70 Years and Older, Female Preventive care refers to lifestyle choices and visits with your health care provider that can promote health and wellness. What does preventive care include? A yearly physical exam. This is also called an annual well check. Dental exams once or twice a year. Routine eye exams. Ask your health care provider how often you should have your eyes checked. Personal lifestyle choices, including: Daily care of your teeth and gums. Regular physical activity. Eating a healthy diet. Avoiding tobacco and drug use. Limiting alcohol use. Practicing safe sex. Taking low-dose aspirin every day. Taking vitamin and mineral supplements as recommended by your health care provider. What happens during an annual well check? The services and screenings done by your health care provider during your annual well check will depend on your age, overall health, lifestyle risk factors, and family history of disease. Counseling  Your health care provider may ask you questions about your: Alcohol use. Tobacco  use. Drug use. Emotional well-being. Home and relationship well-being. Sexual activity. Eating habits. History of falls. Memory and ability to understand (cognition). Work and work Statistician. Reproductive health. Screening  You may have the following tests or measurements: Height, weight, and BMI. Blood pressure. Lipid and cholesterol levels. These may be checked every 5 years, or more frequently if you are over 109 years old. Skin check. Lung cancer screening. You may have this screening every year starting at age 39 if you have a 30-pack-year history of smoking and currently smoke or have quit within the past 15 years. Fecal occult blood test (FOBT) of the stool. You may have this test every year starting at age 33. Flexible sigmoidoscopy or colonoscopy. You may have a sigmoidoscopy every 5 years or a colonoscopy every 10 years starting at age 32. Hepatitis C blood test. Hepatitis B blood test. Sexually transmitted disease (STD) testing. Diabetes screening. This is done by checking your blood sugar (glucose) after you have not eaten for a while (fasting). You may have this done every 1-3 years. Bone density scan. This is done to screen for osteoporosis. You may have this done starting at age 22. Mammogram. This may be done every 1-2 years. Talk to your health care provider about how often you should have regular mammograms. Talk with your health care provider about your test results, treatment options, and if necessary, the need for more tests. Vaccines  Your health care provider may recommend certain vaccines, such as: Influenza vaccine. This is recommended every year. Tetanus, diphtheria, and acellular pertussis (Tdap, Td) vaccine. You may need a Td booster every 10 years. Zoster vaccine. You may need this after age 34. Pneumococcal 13-valent conjugate (  PCV13) vaccine. One dose is recommended after age 89. Pneumococcal polysaccharide (PPSV23) vaccine. One dose is recommended  after age 60. Talk to your health care provider about which screenings and vaccines you need and how often you need them. This information is not intended to replace advice given to you by your health care provider. Make sure you discuss any questions you have with your health care provider. Document Released: 12/21/2015 Document Revised: 08/13/2016 Document Reviewed: 09/25/2015 Elsevier Interactive Patient Education  2017 [place] Prevention in the Home Falls can cause injuries. They can happen to people of all ages. There are many things you can do to make your home safe and to help prevent falls. What can I do on the outside of my home? Regularly fix the edges of walkways and driveways and fix any cracks. Remove anything that might make you trip as you walk through a door, such as a raised step or threshold. Trim any bushes or trees on the path to your home. Use bright outdoor lighting. Clear any walking paths of anything that might make someone trip, such as rocks or tools. Regularly check to see if handrails are loose or broken. Make sure that both sides of any steps have handrails. Any raised decks and porches should have guardrails on the edges. Have any leaves, snow, or ice cleared regularly. Use sand or salt on walking paths during winter. Clean up any spills in your garage right away. This includes oil or grease spills. What can I do in the bathroom? Use night lights. Install grab bars by the toilet and in the tub and shower. Do not use towel bars as grab bars. Use non-skid mats or decals in the tub or shower. If you need to sit down in the shower, use a plastic, non-slip stool. Keep the floor dry. Clean up any water that spills on the floor as soon as it happens. Remove soap buildup in the tub or shower regularly. Attach bath mats securely with double-sided non-slip rug tape. Do not have throw rugs and other things on the floor that can make you trip. What can I do  in the bedroom? Use night lights. Make sure that you have a light by your bed that is easy to reach. Do not use any sheets or blankets that are too big for your bed. They should not hang down onto the floor. Have a firm chair that has side arms. You can use this for support while you get dressed. Do not have throw rugs and other things on the floor that can make you trip. What can I do in the kitchen? Clean up any spills right away. Avoid walking on wet floors. Keep items that you use a lot in easy-to-reach places. If you need to reach something above you, use a strong step stool that has a grab bar. Keep electrical cords out of the way. Do not use floor polish or wax that makes floors slippery. If you must use wax, use non-skid floor wax. Do not have throw rugs and other things on the floor that can make you trip. What can I do with my stairs? Do not leave any items on the stairs. Make sure that there are handrails on both sides of the stairs and use them. Fix handrails that are broken or loose. Make sure that handrails are as long as the stairways. Check any carpeting to make sure that it is firmly attached to the stairs. Fix any carpet that is  loose or worn. Avoid having throw rugs at the top or bottom of the stairs. If you do have throw rugs, attach them to the floor with carpet tape. Make sure that you have a light switch at the top of the stairs and the bottom of the stairs. If you do not have them, ask someone to add them for you. What else can I do to help prevent falls? Wear shoes that: Do not have high heels. Have rubber bottoms. Are comfortable and fit you well. Are closed at the toe. Do not wear sandals. If you use a stepladder: Make sure that it is fully opened. Do not climb a closed stepladder. Make sure that both sides of the stepladder are locked into place. Ask someone to hold it for you, if possible. Clearly mark and make sure that you can see: Any grab bars or  handrails. First and last steps. Where the edge of each step is. Use tools that help you move around (mobility aids) if they are needed. These include: Canes. Walkers. Scooters. Crutches. Turn on the lights when you go into a dark area. Replace any light bulbs as soon as they burn out. Set up your furniture so you have a clear path. Avoid moving your furniture around. If any of your floors are uneven, fix them. If there are any pets around you, be aware of where they are. Review your medicines with your doctor. Some medicines can make you feel dizzy. This can increase your chance of falling. Ask your doctor what other things that you can do to help prevent falls. This information is not intended to replace advice given to you by your health care provider. Make sure you discuss any questions you have with your health care provider. Document Released: 09/20/2009 Document Revised: 05/01/2016 Document Reviewed: 12/29/2014 Elsevier Interactive Patient Education  2017 [employer].

## 2022-06-05 NOTE — Telephone Encounter (Signed)
Ms. jency, schnieders are scheduled for a virtual visit with your provider today.    Just as we do with appointments in the office, we must obtain your consent to participate.  Your consent will be active for this visit and any virtual visit you Anyelina Claycomb have with one of our providers in the next 365 days.    If you have a MyChart account, I can also send a copy of this consent to you electronically.  All virtual visits are billed to your insurance company just like a traditional visit in the office.  As this is a virtual visit, video technology does not allow for your provider to perform a traditional examination.  This Carnetta Losada limit your provider's ability to fully assess your condition.  If your provider identifies any concerns that need to be evaluated in person or the need to arrange testing such as labs, EKG, etc, we will make arrangements to do so.    Although advances in technology are sophisticated, we cannot ensure that it will always work on either your end or our end.  If the connection with a video visit is poor, we Riva Sesma have to switch to a telephone visit.  With either a video or telephone visit, we are not always able to ensure that we have a secure connection.   I need to obtain your verbal consent now.   Are you willing to proceed with your visit today?   Rechy Bost Viti has provided verbal consent on 06/05/2022 for a virtual visit (video or telephone).   MayAlbertina Senegal, [place] 06/05/2022  12:39 PM

## 2022-06-05 NOTE — Progress Notes (Signed)
This service is provided via telemedicine  No vital signs collected/recorded due to the encounter was a telemedicine visit.   Location of patient (ex: home, work):  home  Patient consents to a telephone visit:  Yes  Location of the provider (ex: office, home):  Office  Name of any referring provider:  Audreanna Torrisi,NP  Names of all persons participating in the telemedicine service and their role in the encounter:  [name], daughter, Chauncey Bruno, Rodena Piety, [place] and Marlowe Sax, NP  Time spent on call:  12:48mnutes     Subjective:   [name] BUJAKis a 76y.o. female who presents for Medicare Annual (Subsequent) preventive examination.  Review of Systems     Cardiac Risk Factors include: advanced age (>512m, >6>52omen);hypertension;smoking/ tobacco exposure;obesity (BMI >30kg/m2);sedentary lifestyle     Objective:    There were no vitals filed for this visit. There is no height or weight on file to calculate BMI.     06/05/2022   12:33 PM 05/12/2022    1:44 PM 03/21/2022    1:03 PM 11/14/2021    3:03 PM 09/06/2021   11:02 AM 05/28/2021    1:57 PM 02/28/2021    2:20 PM  Advanced Directives  Does Patient Have a Medical Advance Directive? Yes Yes Yes No No Yes No  Type of AdIndustrial/product designerf [employer] Does patient want to make changes to medical advance directive?  No - Patient declined No - Patient declined   No - Patient declined   Copy of [place] Chart?  No - copy requested No - copy requested    No - copy requested   Would patient like information on creating a medical advance directive?    No - Patient declined No - Patient declined No - Patient declined No - Patient declined     Significant value    Current Medications (verified) Outpatient Encounter Medications as of 06/05/2022  Medication Sig   alendronate (FOSAMAX) 70 MG tablet Take 1  tablet (70 mg total) by mouth every 7 (seven) days. Take with a full glass of water on an empty stomach.   apixaban (ELIQUIS) 5 MG TABS tablet TAKE 1 TABLET TWICE DAILY   Calcium Carb-Cholecalciferol (CALCIUM 600+D3 PO) Take 1 tablet by mouth 2 (two) times daily.   cholecalciferol (VITAMIN D) 1000 units tablet Take 1,000 Units by mouth daily.   furosemide (LASIX) 20 MG tablet Take 1 tablet (20 mg total) by mouth daily.   furosemide (LASIX) 20 MG tablet As needed patient may take 20 MG Lasix PRN by mouth QD x 4 days as directed per [facility] Research HF Study PRN plan.  DO NOT REMOVE   hydrALAZINE (APRESOLINE) 25 MG tablet TAKE 1 TABLET THREE TIMES DAILY WITH MEALS TO CONTROL BLOOD PRESSURE   losartan (COZAAR) 50 MG tablet Take 1/2 tablet (25 mg total) by mouth daily.   metoprolol tartrate (LOPRESSOR) 25 MG tablet TAKE 1/2 TABLET TWICE DAILY   Multiple Vitamin (MULTIVITAMIN) tablet Take 1 tablet by mouth daily.   NAMZARIC 28-10 MG CP24 TAKE 1 CAPSULE  DAILY TO PRESERVE MEMORY   oxybutynin (DITROPAN) 5 MG tablet TAKE 1 TABLET ONE TIME DAILY FOR BLADDER CONTROL   potassium chloride SA (KLOR-CON M) 20 MEQ tablet As needed patient may take 20 mEq Potassium PRN by mouth QD x 4 days as directed per [facility] Research HF Study  PRN plan.  DO NOT REMOVE ORDER   potassium chloride SA (KLOR-CON M) 20 MEQ tablet TAKE 1 TABLET EVERY DAY   simvastatin (ZOCOR) 40 MG tablet TAKE 1 TABLET EVERY DAY AT 6PM   Facility-Administered Encounter Medications as of 06/05/2022  Medication   lidocaine-EPINEPHrine (XYLOCAINE W/EPI) 1 %-1:100000 (with pres) injection 10 mL    Allergies (verified) Patient has no known allergies.   History: Past Medical History:  Diagnosis Date   Cataract    slow growing    Chronic kidney disease    Hyperlipidemia    Hypertension    Insomnia, unspecified    Memory loss    mild dementia per daughter    Osteopenia    Other abnormal blood chemistry    Other specified cardiac  dysrhythmias(427.89)    Other specified disease of nail    Stroke ([place])    2006 or 2007 per daughter    Syncope and collapse    Unspecified disorder of kidney and ureter    Unspecified late effects of cerebrovascular disease    Unspecified transient cerebral ischemia    Unspecified urinary incontinence    Unspecified vitamin D deficiency    Vascular dementia, uncomplicated (HCC)    Past Surgical History:  Procedure Laterality Date   NO PAST SURGERIES     Family History  Problem Relation Age of Onset   Pneumonia Mother    Kidney disease Sister    Hypertension Brother    Colon cancer Neg Hx    Colon polyps Neg Hx    Esophageal cancer Neg Hx    Rectal cancer Neg Hx    Stomach cancer Neg Hx    Social History   Socioeconomic History   Marital status: Single    Spouse name: Not on file   Number of children: Not on file   Years of education: Not on file   Highest education level: Not on file  Occupational History   Not on file  Tobacco Use   Smoking status: Former    Years: 30.00    Types: Cigarettes   Smokeless tobacco: Never   Tobacco comments:    Quit about 7-8 years ago as of 2015   Vaping Use   Vaping Use: Never used  Substance and Sexual Activity   Alcohol use: No   Drug use: No   Sexual activity: Never  Other Topics Concern   Not on file  Social History Narrative   Not on file   Social Determinants of Health   Financial Resource Strain: Low Risk  (12/25/2017)   Overall Financial Resource Strain (CARDIA)    Difficulty of Paying Living Expenses: Not hard at all  Food Insecurity: No Food Insecurity (12/25/2017)   Hunger Vital Sign    Worried About Running Out of Food in the Last Year: Never true    Ran Out of Food in the Last Year: Never true  Transportation Needs: No Transportation Needs (12/25/2017)   PRAPARE - Hydrologist (Medical): No    Lack of Transportation (Non-Medical): No  Physical Activity: Inactive (12/25/2017)    Exercise Vital Sign    Days of Exercise per Week: 0 days    Minutes of Exercise per Session: 0 min  Stress: No Stress Concern Present (12/25/2017)   [place]    Feeling of Stress : Only a little  Social Connections: Somewhat Isolated (12/25/2017)   Social Connection and Isolation Panel [NHANES]  Frequency of Communication with Friends and Family: Once a week    Frequency of Social Gatherings with Friends and Family: More than three times a week    Attends Religious Services: 1 to 4 times per year    Active Member of [employer] or Organizations: No    Attends Music therapist: Never    Marital Status: Never married    Tobacco Counseling Counseling given: Not Answered Tobacco comments: Quit about 7-8 years ago as of 2015    Clinical Intake:  Pre-visit preparation completed: No  Pain : No/denies pain     BMI - recorded: 43.26 Nutritional Status: BMI > 30  Obese Nutritional Risks: None Diabetes: No     Diabetic?No          Activities of Daily Living    06/05/2022    1:15 PM  In your present state of health, do you have any difficulty performing the following activities:  Hearing? 0  Vision? 0  Difficulty concentrating or making decisions? 0  Walking or climbing stairs? 1  Comment hx CVA  Dressing or bathing? 1  Comment daughter assist  Doing errands, shopping? 1  Comment daughter  Conservation officer, nature and eating ? Y  Comment daughter assist  Using the Toilet? N  Do you have problems with loss of bowel control? N  Managing your Medications? N  Managing your Finances? N  Housekeeping or managing your Housekeeping? Y  Comment daughter assist    Patient Care Team: Lattie Cervi, Nelda Bucks, NP as PCP - General (Family Medicine) [name], MD as Consulting Physician (Ophthalmology)  Indicate any recent Medical Services you may have received from other than [facility] providers in the past year  (date may be approximate).     Assessment:   This is a routine wellness examination for [place].  Hearing/Vision screen No results found.  Dietary issues and exercise activities discussed: Current Exercise Habits: The patient does not participate in regular exercise at present, Exercise limited by: Other - see comments (hx of CVA)   Goals Addressed             This Visit's Progress    Exercise 3x per week (30 min per time)   Not on track    Patient would like to walk for at least 20 minutes a day inside or outside the house       Depression Screen    06/05/2022   12:34 PM 03/21/2022    1:02 PM 05/28/2021    1:52 PM 04/23/2020    1:12 PM 12/29/2019    8:50 AM 06/27/2019    3:32 PM 04/19/2019    9:15 AM  PHQ 2/9 Scores  PHQ - 2 Score 0 0 0 0 0 0 0    Fall Risk    06/05/2022   12:33 PM 05/12/2022    1:43 PM 03/21/2022    1:02 PM 12/27/2021    3:23 PM 11/14/2021    3:03 PM  [place] in the past year? '1 1 1 '$ 0 0  Number falls in past yr: 0 0 0 0 0  Injury with Fall? 0 0 0 0 0  Risk for fall due to :  History of fall(s) No Fall Risks No Fall Risks No Fall Risks  Follow up  Falls evaluation completed;Education provided;Falls prevention discussed Falls evaluation completed Falls evaluation completed;Education provided;Falls prevention discussed Falls evaluation completed    FALL RISK PREVENTION PERTAINING TO THE HOME:  Any stairs in or  around the home? No  If so, are there any without handrails? No  Home free of loose throw rugs in walkways, pet beds, electrical cords, etc? No  Adequate lighting in your home to reduce risk of falls? Yes   ASSISTIVE DEVICES UTILIZED TO PREVENT FALLS:  Life alert? No  Use of a cane, walker or w/c? Walker  Grab bars in the bathroom? Yes  Shower chair or bench in shower? No  Elevated toilet seat or a handicapped toilet? Yes   TIMED UP AND GO:  Was the test performed? No .  Length of time to ambulate 10 feet: N/A sec.   Gait  unsteady without use of assistive device, provider informed and interventions were implemented  Cognitive Function:    12/25/2017    8:48 AM 09/11/2016    9:28 AM 08/06/2015    2:07 PM  MMSE - Mini Mental State Exam  Orientation to time '4 3 5  '$ Orientation to Place '4 4 3  '$ Registration '3 3 3  '$ Attention/ Calculation '5 5 5  '$ Recall 0 0 0  Language- name 2 objects '2 2 2  '$ Language- repeat '1 1 1  '$ Language- follow 3 step command '3 3 3  '$ Language- read & follow direction '1 1 1  '$ Write a sentence 1 0 1  Copy design 0 0 0  Total score '24 22 24        '$ 06/05/2022   12:34 PM 05/28/2021    1:53 PM 04/23/2020    1:12 PM 04/19/2019    9:18 AM  6CIT Screen  What Year? 0 points 0 points 4 points 0 points  What month? 0 points 3 points 0 points 0 points  What time? 3 points 0 points 0 points 0 points  Count back from 20 0 points 4 points 4 points 0 points  Months in reverse 4 points 4 points 4 points 4 points  Repeat phrase 6 points 10 points 10 points 10 points  Total Score 13 points 21 points 22 points 14 points    Immunizations Immunization History  Administered Date(s) Administered   Fluad Quad(high Dose 65+) 11/22/2020, 09/06/2021   Influenza, High Dose Seasonal PF 12/25/2017, 08/19/2018   Influenza,inj,Quad PF,6+ Mos 10/17/2013, 12/07/2015, 09/01/2016   Influenza-Unspecified 08/25/2008, 09/12/2011, 09/27/2012   PFIZER(Purple Top)SARS-COV-2 Vaccination 12/27/2019, 01/17/2020   Pneumococcal Conjugate-13 08/04/2014   Pneumococcal Polysaccharide-23 03/21/2022   Pneumococcal-Unspecified 05/07/2011   Tdap 05/07/2011    TDAP status: Due, Education has been provided regarding the importance of this vaccine. Advised may receive this vaccine at local pharmacy or Health Dept. Aware to provide a copy of the vaccination record if obtained from local pharmacy or Health Dept. Verbalized acceptance and understanding.  Flu Vaccine status: Up to date  Pneumococcal vaccine status: Up to  date  Covid-19 vaccine status: Information provided on how to obtain vaccines.   Qualifies for Shingles Vaccine? Yes   Zostavax completed No   Shingrix Completed?: No.    Education has been provided regarding the importance of this vaccine. Patient has been advised to call insurance company to determine out of pocket expense if they have not yet received this vaccine. Advised may also receive vaccine at local pharmacy or Health Dept. Verbalized acceptance and understanding.  Screening Tests Health Maintenance  Topic Date Due   Zoster Vaccines- Shingrix (1 of 2) Never done   COVID-19 Vaccine (3 - Pfizer series) 03/13/2020   TETANUS/TDAP  05/06/2021   INFLUENZA VACCINE  07/08/2022   Pneumonia Vaccine 65+  Years old  Completed   DEXA SCAN  Completed   Hepatitis C Screening  Completed   HPV VACCINES  Aged Out   Fecal DNA (Cologuard)  Discontinued    Health Maintenance  Health Maintenance Due  Topic Date Due   Zoster Vaccines- Shingrix (1 of 2) Never done   COVID-19 Vaccine (3 - Pfizer series) 03/13/2020   TETANUS/TDAP  05/06/2021    Colorectal cancer screening: No longer required.   Mammogram status: No longer required due to age.  Bone Density status: Completed 04/28/2022. Results reflect: Bone density results: OSTEOPOROSIS. Repeat every 2 years.  Lung Cancer Screening: (Low Dose CT Chest recommended if Age 71-80 years, 30 pack-year currently smoking OR have quit w/in 15years.) does not qualify.   Lung Cancer Screening Referral: No  Additional Screening:  Hepatitis C Screening: does qualify; Completed yes  Vision Screening: Recommended annual ophthalmology exams for early detection of glaucoma and other disorders of the eye. Is the patient up to date with their annual eye exam?  Yes  Who is the provider or what is the name of the office in which the patient attends annual eye exams? Dr.Hecker If pt is not established with a provider, would they like to be referred to a  provider to establish care? No .   Dental Screening: Recommended annual dental exams for proper oral hygiene  Community Resource Referral / Chronic Care Management: CRR required this visit?  No   CCM required this visit?  No      Plan:     I have personally reviewed and noted the following in the patient's chart:   Medical and social history Use of alcohol, tobacco or illicit drugs  Current medications and supplements including opioid prescriptions.  Functional ability and status Nutritional status Physical activity Advanced directives List of other physicians Hospitalizations, surgeries, and ER visits in previous 12 months Vitals Screenings to include cognitive, depression, and falls Referrals and appointments  In addition, I have reviewed and discussed with patient certain preventive protocols, quality metrics, and best practice recommendations. A written personalized care plan for preventive services as well as general preventive health recommendations were provided to patient.     Sandrea Hughs, NP   06/05/2022   Nurse Notes: Advised to get Shingles,COVID-19 booster 3rd vaccine and Tetanus vaccine at the pharmacy

## 2022-07-14 ENCOUNTER — Other Ambulatory Visit: Payer: Self-pay | Admitting: Family

## 2022-07-23 VITALS — BP 154/66 | HR 61 | Resp 14 | Wt 252.0 lb

## 2022-07-23 DIAGNOSIS — Z006 Encounter for examination for normal comparison and control in clinical research program: Secondary | ICD-10-CM

## 2022-07-23 NOTE — Research (Signed)
Alleviate HF Research Study  6 Month Follow-up  No medication changes or adverse events to report at this time.  Labs drawn EQ 5D 5L completed 6 MHWT NYHA ll   Current Outpatient Medications:    alendronate (FOSAMAX) 70 MG tablet, Take 1 tablet (70 mg total) by mouth every 7 (seven) days. Take with a full glass of water on an empty stomach., Disp: 4 tablet, Rfl: 11   apixaban (ELIQUIS) 5 MG TABS tablet, TAKE 1 TABLET TWICE DAILY, Disp: 180 tablet, Rfl: 1   Calcium Carb-Cholecalciferol (CALCIUM 600+D3 PO), Take 1 tablet by mouth 2 (two) times daily., Disp: , Rfl:    cholecalciferol (VITAMIN D) 1000 units tablet, Take 1,000 Units by mouth daily., Disp: , Rfl:    furosemide (LASIX) 20 MG tablet, Take 1 tablet (20 mg total) by mouth daily., Disp: 360 tablet, Rfl: 0   furosemide (LASIX) 20 MG tablet, As needed patient may take 20 MG Lasix PRN by mouth QD x 4 days as directed per Alleviate Research HF Study PRN plan.  DO NOT REMOVE, Disp: 90 tablet, Rfl: 3   hydrALAZINE (APRESOLINE) 25 MG tablet, TAKE 1 TABLET THREE TIMES DAILY WITH MEALS TO CONTROL BLOOD PRESSURE, Disp: 270 tablet, Rfl: 2   losartan (COZAAR) 50 MG tablet, Take 1/2 tablet (25 mg total) by mouth daily., Disp: 30 tablet, Rfl: 1   Memantine HCl-Donepezil HCl (NAMZARIC) 28-10 MG CP24, TAKE 1 CAPSULE  DAILY TO PRESERVE MEMORY, Disp: 90 capsule, Rfl: 3   metoprolol tartrate (LOPRESSOR) 25 MG tablet, TAKE 1/2 TABLET TWICE DAILY, Disp: 90 tablet, Rfl: 2   Multiple Vitamin (MULTIVITAMIN) tablet, Take 1 tablet by mouth daily., Disp: , Rfl:    oxybutynin (DITROPAN) 5 MG tablet, TAKE 1 TABLET ONE TIME DAILY FOR BLADDER CONTROL, Disp: 90 tablet, Rfl: 2   potassium chloride SA (KLOR-CON M) 20 MEQ tablet, As needed patient may take 20 mEq Potassium PRN by mouth QD x 4 days as directed per Alleviate Research HF Study PRN plan.  DO NOT REMOVE ORDER, Disp: 90 tablet, Rfl: 3   potassium chloride SA (KLOR-CON M) 20 MEQ tablet, TAKE 1 TABLET EVERY  DAY, Disp: 90 tablet, Rfl: 1   simvastatin (ZOCOR) 40 MG tablet, TAKE 1 TABLET EVERY DAY AT 6PM, Disp: 90 tablet, Rfl: 3  Current Facility-Administered Medications:    lidocaine-EPINEPHrine (XYLOCAINE W/EPI) 1 %-1:100000 (with pres) injection 10 mL, 10 mL, Infiltration, Once, Croitoru, Mihai, MD

## 2022-07-24 LAB — CBC
Hematocrit: 42.1 % (ref 34.0–46.6)
Hemoglobin: 13.9 g/dL (ref 11.1–15.9)
MCH: 28.7 pg (ref 26.6–33.0)
MCHC: 33 g/dL (ref 31.5–35.7)
MCV: 87 fL (ref 79–97)
Platelets: 226 10*3/uL (ref 150–450)
RBC: 4.84 x10E6/uL (ref 3.77–5.28)
RDW: 11.8 % (ref 11.7–15.4)
WBC: 7.3 10*3/uL (ref 3.4–10.8)

## 2022-07-24 LAB — BASIC METABOLIC PANEL
BUN/Creatinine Ratio: 11 — ABNORMAL LOW (ref 12–28)
BUN: 14 mg/dL (ref 8–27)
CO2: 23 mmol/L (ref 20–29)
Calcium: 9.7 mg/dL (ref 8.7–10.3)
Chloride: 106 mmol/L (ref 96–106)
Creatinine, Ser: 1.25 mg/dL — ABNORMAL HIGH (ref 0.57–1.00)
Glucose: 120 mg/dL — ABNORMAL HIGH (ref 70–99)
Potassium: 4 mmol/L (ref 3.5–5.2)
Sodium: 144 mmol/L (ref 134–144)
eGFR: 44 mL/min/{1.73_m2} — ABNORMAL LOW (ref >59)

## 2022-07-24 LAB — BRAIN NATRIURETIC PEPTIDE: BNP: 47.5 pg/mL (ref 0.0–100.0)

## 2022-09-01 DIAGNOSIS — H25012 Cortical age-related cataract, left eye: Secondary | ICD-10-CM | POA: Diagnosis not present

## 2022-09-01 DIAGNOSIS — H26491 Other secondary cataract, right eye: Secondary | ICD-10-CM | POA: Diagnosis not present

## 2022-09-01 DIAGNOSIS — H401231 Low-tension glaucoma, bilateral, mild stage: Secondary | ICD-10-CM | POA: Diagnosis not present

## 2022-09-01 DIAGNOSIS — H2512 Age-related nuclear cataract, left eye: Secondary | ICD-10-CM | POA: Diagnosis not present

## 2022-09-01 DIAGNOSIS — H353131 Nonexudative age-related macular degeneration, bilateral, early dry stage: Secondary | ICD-10-CM | POA: Diagnosis not present

## 2022-09-07 HISTORY — PX: CATARACT EXTRACTION: SUR2

## 2022-09-16 DIAGNOSIS — H2512 Age-related nuclear cataract, left eye: Secondary | ICD-10-CM | POA: Diagnosis not present

## 2022-09-16 DIAGNOSIS — H25812 Combined forms of age-related cataract, left eye: Secondary | ICD-10-CM | POA: Diagnosis not present

## 2022-09-16 DIAGNOSIS — H25012 Cortical age-related cataract, left eye: Secondary | ICD-10-CM | POA: Diagnosis not present

## 2022-09-19 DIAGNOSIS — Z006 Encounter for examination for normal comparison and control in clinical research program: Secondary | ICD-10-CM

## 2022-09-22 ENCOUNTER — Other Ambulatory Visit: Payer: Medicare HMO

## 2022-09-22 NOTE — Research (Addendum)
Alleviate HF Study 8 Month f/u  No adverse events or medications changes to report at this time.   Current Outpatient Medications:    alendronate (FOSAMAX) 70 MG tablet, Take 1 tablet (70 mg total) by mouth every 7 (seven) days. Take with a full glass of water on an empty stomach., Disp: 4 tablet, Rfl: 11   apixaban (ELIQUIS) 5 MG TABS tablet, TAKE 1 TABLET TWICE DAILY, Disp: 180 tablet, Rfl: 1   Calcium Carb-Cholecalciferol (CALCIUM 600+D3 PO), Take 1 tablet by mouth 2 (two) times daily., Disp: , Rfl:    cholecalciferol (VITAMIN D) 1000 units tablet, Take 1,000 Units by mouth daily., Disp: , Rfl:    furosemide (LASIX) 20 MG tablet, Take 1 tablet (20 mg total) by mouth daily., Disp: 360 tablet, Rfl: 0   furosemide (LASIX) 20 MG tablet, As needed patient may take 20 MG Lasix PRN by mouth QD x 4 days as directed per Alleviate Research HF Study PRN plan.  DO NOT REMOVE, Disp: 90 tablet, Rfl: 3   hydrALAZINE (APRESOLINE) 25 MG tablet, TAKE 1 TABLET THREE TIMES DAILY WITH MEALS TO CONTROL BLOOD PRESSURE, Disp: 270 tablet, Rfl: 2   losartan (COZAAR) 50 MG tablet, Take 1/2 tablet (25 mg total) by mouth daily., Disp: 30 tablet, Rfl: 1   Memantine HCl-Donepezil HCl (NAMZARIC) 28-10 MG CP24, TAKE 1 CAPSULE  DAILY TO PRESERVE MEMORY, Disp: 90 capsule, Rfl: 3   metoprolol tartrate (LOPRESSOR) 25 MG tablet, TAKE 1/2 TABLET TWICE DAILY, Disp: 90 tablet, Rfl: 2   Multiple Vitamin (MULTIVITAMIN) tablet, Take 1 tablet by mouth daily., Disp: , Rfl:    oxybutynin (DITROPAN) 5 MG tablet, TAKE 1 TABLET ONE TIME DAILY FOR BLADDER CONTROL, Disp: 90 tablet, Rfl: 2   potassium chloride SA (KLOR-CON M) 20 MEQ tablet, As needed patient may take 20 mEq Potassium PRN by mouth QD x 4 days as directed per Alleviate Research HF Study PRN plan.  DO NOT REMOVE ORDER, Disp: 90 tablet, Rfl: 3   potassium chloride SA (KLOR-CON M) 20 MEQ tablet, TAKE 1 TABLET EVERY DAY, Disp: 90 tablet, Rfl: 1   simvastatin (ZOCOR) 40 MG tablet,  TAKE 1 TABLET EVERY DAY AT 6PM, Disp: 90 tablet, Rfl: 3  Current Facility-Administered Medications:    lidocaine-EPINEPHrine (XYLOCAINE W/EPI) 1 %-1:100000 (with pres) injection 10 mL, 10 mL, Infiltration, Once, Croitoru, Mihai, MD

## 2022-09-25 ENCOUNTER — Ambulatory Visit: Payer: Medicare HMO | Admitting: Family

## 2022-10-20 ENCOUNTER — Other Ambulatory Visit: Payer: Medicare HMO

## 2022-10-20 DIAGNOSIS — I129 Hypertensive chronic kidney disease with stage 1 through stage 4 chronic kidney disease, or unspecified chronic kidney disease: Secondary | ICD-10-CM | POA: Diagnosis not present

## 2022-10-20 DIAGNOSIS — N183 Chronic kidney disease, stage 3 unspecified: Secondary | ICD-10-CM | POA: Diagnosis not present

## 2022-10-20 DIAGNOSIS — I480 Paroxysmal atrial fibrillation: Secondary | ICD-10-CM | POA: Diagnosis not present

## 2022-10-20 DIAGNOSIS — E782 Mixed hyperlipidemia: Secondary | ICD-10-CM

## 2022-10-20 LAB — CBC WITH DIFFERENTIAL/PLATELET
Absolute Monocytes: 545 cells/uL (ref 200–950)
Basophils Absolute: 21 cells/uL (ref 0–200)
Basophils Relative: 0.3 %
Eosinophils Absolute: 90 cells/uL (ref 15–500)
Eosinophils Relative: 1.3 %
HCT: 43.2 % (ref 35.0–45.0)
Hemoglobin: 14.2 g/dL (ref 11.7–15.5)
Lymphs Abs: 3036 cells/uL (ref 850–3900)
MCH: 29.2 pg (ref 27.0–33.0)
MCHC: 32.9 g/dL (ref 32.0–36.0)
MCV: 88.9 fL (ref 80.0–100.0)
MPV: 10 fL (ref 7.5–12.5)
Monocytes Relative: 7.9 %
Neutro Abs: 3209 cells/uL (ref 1500–7800)
Neutrophils Relative %: 46.5 %
Platelets: 257 10*3/uL (ref 140–400)
RBC: 4.86 10*6/uL (ref 3.80–5.10)
RDW: 11.8 % (ref 11.0–15.0)
Total Lymphocyte: 44 %
WBC: 6.9 10*3/uL (ref 3.8–10.8)

## 2022-10-20 LAB — COMPLETE METABOLIC PANEL WITH GFR
AG Ratio: 1.3 (calc) (ref 1.0–2.5)
ALT: 12 U/L (ref 6–29)
AST: 15 U/L (ref 10–35)
Albumin: 3.8 g/dL (ref 3.6–5.1)
Alkaline phosphatase (APISO): 71 U/L (ref 37–153)
BUN/Creatinine Ratio: 12 (calc) (ref 6–22)
BUN: 15 mg/dL (ref 7–25)
CO2: 28 mmol/L (ref 20–32)
Calcium: 9.3 mg/dL (ref 8.6–10.4)
Chloride: 106 mmol/L (ref 98–110)
Creat: 1.22 mg/dL — ABNORMAL HIGH (ref 0.60–1.00)
Globulin: 2.9 g/dL (calc) (ref 1.9–3.7)
Glucose, Bld: 97 mg/dL (ref 65–99)
Potassium: 4 mmol/L (ref 3.5–5.3)
Sodium: 144 mmol/L (ref 135–146)
Total Bilirubin: 0.6 mg/dL (ref 0.2–1.2)
Total Protein: 6.7 g/dL (ref 6.1–8.1)
eGFR: 45 mL/min/{1.73_m2} — ABNORMAL LOW (ref >=60)

## 2022-10-20 LAB — LIPID PANEL
Cholesterol: 164 mg/dL (ref <200)
HDL: 67 mg/dL (ref >=50)
LDL Cholesterol (Calc): 83 mg/dL (calc)
Non-HDL Cholesterol (Calc): 97 mg/dL (calc) (ref <130)
Total CHOL/HDL Ratio: 2.4 (calc) (ref <5.0)
Triglycerides: 64 mg/dL (ref <150)

## 2022-10-20 LAB — TSH: TSH: 1.23 mIU/L (ref 0.40–4.50)

## 2022-10-22 ENCOUNTER — Ambulatory Visit (INDEPENDENT_AMBULATORY_CARE_PROVIDER_SITE_OTHER): Payer: Medicare HMO | Admitting: Family

## 2022-10-22 ENCOUNTER — Encounter: Payer: Self-pay | Admitting: Family

## 2022-10-22 VITALS — BP 126/80 | HR 54 | Temp 97.8°F | Ht 64.0 in | Wt 251.0 lb

## 2022-10-22 DIAGNOSIS — D6869 Other thrombophilia: Secondary | ICD-10-CM

## 2022-10-22 DIAGNOSIS — I129 Hypertensive chronic kidney disease with stage 1 through stage 4 chronic kidney disease, or unspecified chronic kidney disease: Secondary | ICD-10-CM

## 2022-10-22 DIAGNOSIS — Z6841 Body Mass Index (BMI) 40.0 and over, adult: Secondary | ICD-10-CM | POA: Diagnosis not present

## 2022-10-22 DIAGNOSIS — I6930 Unspecified sequelae of cerebral infarction: Secondary | ICD-10-CM

## 2022-10-22 DIAGNOSIS — E782 Mixed hyperlipidemia: Secondary | ICD-10-CM | POA: Diagnosis not present

## 2022-10-22 DIAGNOSIS — I69351 Hemiplegia and hemiparesis following cerebral infarction affecting right dominant side: Secondary | ICD-10-CM | POA: Diagnosis not present

## 2022-10-22 DIAGNOSIS — Z23 Encounter for immunization: Secondary | ICD-10-CM | POA: Diagnosis not present

## 2022-10-22 DIAGNOSIS — F0150 Vascular dementia without behavioral disturbance: Secondary | ICD-10-CM

## 2022-10-22 DIAGNOSIS — N183 Chronic kidney disease, stage 3 unspecified: Secondary | ICD-10-CM

## 2022-10-22 DIAGNOSIS — I480 Paroxysmal atrial fibrillation: Secondary | ICD-10-CM

## 2022-10-22 NOTE — Progress Notes (Signed)
Provider: Marlowe Sax FNP-C   Neymar Dowe, Nelda Bucks, NP  Patient Care Team: Trase Bunda, Nelda Bucks, NP as PCP - General (Family Medicine) [name], MD as Consulting Physician (Ophthalmology)  Extended Emergency Contact Information Primary Emergency Contact: Nerio,[name] Address: [address] [place] [address] [name] of [place] Phone: [phone] Mobile Phone: [phone] Relation: Daughter  Code Status:  Full Code  Goals of care: Advanced Directive information    06/05/2022   12:33 PM  Advanced Directives  Does Patient Have a Medical Advance Directive? Yes  Type of Manufacturing systems engineer Complaint  Patient presents with   Medical Management of Chronic Issues    6 month follow-up and flu vaccine today. Discuss need for td/tdap vaccine and additional covid boosts. Here with daughter.     HPI:  Pt is a 76 y.o. female seen today for 6 months follow up for medical management of chronic diseases.  Recent lab work results reviewed and discussed during visit.Labs unremarkable except   CR 1.22,GFR 45 prev 1.25 ;44 stable. Cholesterol improved.Daughter states has been trying to watch on portion and eating healthy.  Daughter assist with ADL's. Requires assistance with her showers due to right sided weakness due to CVA. States declined day program. No fall episode. Due for Influenza vaccine.   Past Medical History:  Diagnosis Date   Cataract    slow growing    Chronic kidney disease    Hyperlipidemia    Hypertension    Insomnia, unspecified    Memory loss    mild dementia per daughter    Osteopenia    Other abnormal blood chemistry    Other specified cardiac dysrhythmias(427.89)    Other specified disease of nail    Stroke ([place])    2006 or 2007 per daughter    Syncope and collapse    Unspecified disorder of kidney and ureter    Unspecified late effects of cerebrovascular disease    Unspecified  transient cerebral ischemia    Unspecified urinary incontinence    Unspecified vitamin D deficiency    Vascular dementia, uncomplicated ([place])    Past Surgical History:  Procedure Laterality Date   CATARACT EXTRACTION Left 09/2022   CATARACT EXTRACTION Right 11/2021   NO PAST SURGERIES      No Known Allergies  Allergies as of 10/22/2022   No Known Allergies      Medication List        Accurate as of October 22, 2022  1:10 PM. If you have any questions, ask your nurse or doctor.          alendronate 70 MG tablet Commonly known as: FOSAMAX Take 1 tablet (70 mg total) by mouth every 7 (seven) days. Take with a full glass of water on an empty stomach.   CALCIUM 600+D3 PO Take 1 tablet by mouth 2 (two) times daily.   cholecalciferol 1000 units tablet Commonly known as: VITAMIN D Take 1,000 Units by mouth daily.   Eliquis 5 MG Tabs tablet Generic drug: apixaban TAKE 1 TABLET TWICE DAILY   furosemide 20 MG tablet Commonly known as: Lasix Take 1 tablet (20 mg total) by mouth daily.   furosemide 20 MG tablet Commonly known as: LASIX As needed patient may take 20 MG Lasix PRN by mouth QD x 4 days as directed per [facility] HF Study PRN plan.  DO NOT REMOVE   hydrALAZINE 25  MG tablet Commonly known as: APRESOLINE TAKE 1 TABLET THREE TIMES DAILY WITH MEALS TO CONTROL BLOOD PRESSURE   losartan 50 MG tablet Commonly known as: COZAAR Take 1/2 tablet (25 mg total) by mouth daily.   metoprolol tartrate 25 MG tablet Commonly known as: LOPRESSOR TAKE 1/2 TABLET TWICE DAILY   multivitamin tablet Take 1 tablet by mouth daily.   Namzaric 28-10 MG Cp24 Generic drug: Memantine HCl-Donepezil HCl TAKE 1 CAPSULE  DAILY TO PRESERVE MEMORY   oxybutynin 5 MG tablet Commonly known as: DITROPAN TAKE 1 TABLET ONE TIME DAILY FOR BLADDER CONTROL   potassium chloride SA 20 MEQ tablet Commonly known as: KLOR-CON M As needed patient may take 20 mEq Potassium PRN by  mouth QD x 4 days as directed per [facility] HF Study PRN plan.  DO NOT REMOVE ORDER   potassium chloride SA 20 MEQ tablet Commonly known as: KLOR-CON M TAKE 1 TABLET EVERY DAY   prednisoLONE acetate 1 % ophthalmic suspension Commonly known as: PRED FORTE Place 1 drop into the left eye as directed. Every 2-3 hours a day   simvastatin 40 MG tablet Commonly known as: ZOCOR TAKE 1 TABLET EVERY DAY AT 6PM        Review of Systems  Constitutional:  Negative for appetite change, chills, fatigue, fever and unexpected weight change.  HENT:  Positive for dental problem. Negative for congestion, ear discharge, ear pain, hearing loss, nosebleeds, postnasal drip, rhinorrhea, sinus pressure, sinus pain, sneezing, sore throat, tinnitus and trouble swallowing.        Missing teeth   Eyes:  Negative for pain, discharge, redness, itching and visual disturbance.  Respiratory:  Negative for cough, chest tightness, shortness of breath and wheezing.   Cardiovascular:  Negative for chest pain, palpitations and leg swelling.  Gastrointestinal:  Negative for abdominal distention, abdominal pain, blood in stool, constipation, diarrhea, nausea and vomiting.  Endocrine: Negative for cold intolerance, heat intolerance, polydipsia, polyphagia and polyuria.  Genitourinary:  Negative for difficulty urinating, dysuria, flank pain, frequency and urgency.  Musculoskeletal:  Positive for gait problem. Negative for arthralgias, back pain, joint swelling, myalgias, neck pain and neck stiffness.  Skin:  Negative for color change, pallor, rash and wound.  Neurological:  Negative for dizziness, syncope, speech difficulty, light-headedness, numbness and headaches.       Right side weakness   Hematological:  Does not bruise/bleed easily.  Psychiatric/Behavioral:  Negative for agitation, behavioral problems, confusion, hallucinations, self-injury, sleep disturbance and suicidal ideas. The patient is not  nervous/anxious.     Immunization History  Administered Date(s) Administered   Covid-19, Mrna,Vaccine(Spikevax)57yr and older 10/05/2022   Fluad Quad(high Dose 65+) 11/22/2020, 09/06/2021   Influenza, High Dose Seasonal PF 12/25/2017, 08/19/2018   Influenza,inj,Quad PF,6+ Mos 10/17/2013, 12/07/2015, 09/01/2016   Influenza-Unspecified 08/25/2008, 09/12/2011, 09/27/2012   PFIZER(Purple Top)SARS-COV-2 Vaccination 12/27/2019, 01/17/2020   Pneumococcal Conjugate-13 08/04/2014   Pneumococcal Polysaccharide-23 03/21/2022   Pneumococcal-Unspecified 05/07/2011   Respiratory Syncytial Virus Vaccine,Recomb Aduvanted(Arexvy) 10/05/2022   Tdap 05/07/2011   Zoster Recombinat (Shingrix) 10/05/2022   Pertinent  Health Maintenance Due  Topic Date Due   INFLUENZA VACCINE  07/08/2022   DEXA SCAN  Completed      12/27/2021    3:23 PM 03/21/2022    1:02 PM 05/12/2022    1:43 PM 06/05/2022   12:33 PM 10/22/2022    1:02 PM  [place]ippa Passesin the past year? 0 _0 Was there an injury with Fall? 0 0  0 0 0  Fall Risk Category Calculator 0 _0 Fall Risk Category _1   Patient Fall Risk Level Low fall risk Low fall risk Low fall risk  Low fall risk  Patient at Risk for Falls Due to No Fall Risks No Fall Risks History of fall(s)  No Fall Risks  Fall risk Follow up Falls evaluation completed;Education provided;Falls prevention discussed Falls evaluation completed Falls evaluation completed;Education provided;Falls prevention discussed  Falls evaluation completed   Functional Status Survey:    Vitals:   10/22/22 1257  BP: 126/80  Pulse: (!) 54  Temp: 97.8 F (36.6 C)  TempSrc: Temporal  SpO2: 98%  Weight: 251 lb (113.9 kg)  Height: _2  (1.626 m)   Body mass index is 43.08 kg/m. Physical Exam Vitals reviewed.  Constitutional:      General: She is not in acute distress.    Appearance: Normal appearance. She is morbidly obese. She is not ill-appearing or diaphoretic.   HENT:     Head: Normocephalic.     Right Ear: Tympanic membrane, ear canal and external ear normal. There is no impacted cerumen.     Left Ear: Tympanic membrane, ear canal and external ear normal. There is no impacted cerumen.     Nose: Nose normal. No congestion or rhinorrhea.     Mouth/Throat:     Mouth: Mucous membranes are moist.     Pharynx: Oropharynx is clear. No oropharyngeal exudate or posterior oropharyngeal erythema.  Eyes:     General: No scleral icterus.       Right eye: No discharge.        Left eye: No discharge.     Extraocular Movements: Extraocular movements intact.     Conjunctiva/sclera: Conjunctivae normal.     Pupils: Pupils are equal, round, and reactive to light.  Neck:     Vascular: No carotid bruit.  Cardiovascular:     Rate and Rhythm: Normal rate and regular rhythm.     Pulses: Normal pulses.     Heart sounds: Normal heart sounds. No murmur heard.    No friction rub. No gallop.  Pulmonary:     Effort: Pulmonary effort is normal. No respiratory distress.     Breath sounds: Normal breath sounds. No wheezing, rhonchi or rales.  Chest:     Chest wall: No tenderness.  Abdominal:     General: Bowel sounds are normal. There is no distension.     Palpations: Abdomen is soft. There is no mass.     Tenderness: There is no abdominal tenderness. There is no right CVA tenderness, left CVA tenderness, guarding or rebound.  Musculoskeletal:        General: No swelling or tenderness. Normal range of motion.     Cervical back: Normal range of motion. No rigidity or tenderness.     Right lower leg: No edema.     Left lower leg: No edema.  Lymphadenopathy:     Cervical: No cervical adenopathy.  Skin:    General: Skin is warm and dry.     Coloration: Skin is not pale.     Findings: No bruising, erythema, lesion or rash.  Neurological:     Mental Status: She is alert. Mental status is at baseline.     Cranial Nerves: No cranial nerve deficit.     Sensory: No  sensory deficit.     Coordination: Coordination normal.     Gait: Gait normal.  Comments: Right side weakness d/t stroke   Psychiatric:        Mood and Affect: Mood normal.        Speech: Speech normal.        Behavior: Behavior normal.        Thought Content: Thought content normal.        Cognition and Memory: She exhibits impaired recent memory.        Judgment: Judgment normal.     Labs reviewed: Recent Labs    11/08/21 1302 11/09/21 0004 11/10/21 0703 03/19/22 0907 07/23/22 1149 10/20/22 1015  NA  --  138   < > 144 144 144  K  --  4.2   < > 4.3 4.0 4.0  CL  --  103   < > 107 106 106  CO2  --  26   < > _0 GLUCOSE  --  110*   < > 104* 120* 97  BUN  --  18   < > _1 CREATININE  --  1.53*   < > 1.45* 1.25* 1.22*  CALCIUM  --  9.3   < > 9.9 9.7 9.3  MG 2.1 2.0  --   --   --   --    < > = values in this interval not displayed.   Recent Labs    11/08/21 1231 11/09/21 0004 03/19/22 0907 10/20/22 1015  AST _2 ALT _3 ALKPHOS 97 81  --   --   BILITOT 1.1 0.9 0.6 0.6  PROT 7.9 6.7 7.1 6.7  ALBUMIN 3.9 3.4*  --   --    Recent Labs    11/14/21 1549 01/13/22 1300 03/19/22 0907 07/23/22 1149 10/20/22 1015  WBC 8.7   < > 8.9 7.3 6.9  NEUTROABS 4,289  --  3,160  --  3,209  HGB 14.2   < > 14.9 13.9 14.2  HCT 43.8   < > 46.2* 42.1 43.2  MCV 87.6   < > 88.0 87 88.9  PLT 257   < > 261 226 257   < > = values in this interval not displayed.   Lab Results  Component Value Date   TSH 1.23 10/20/2022   Lab Results  Component Value Date   HGBA1C 5.4 05/02/2021   Lab Results  Component Value Date   CHOL 164 10/20/2022   HDL 67 10/20/2022   LDLCALC 83 10/20/2022   TRIG 64 10/20/2022   CHOLHDL 2.4 10/20/2022    Significant Diagnostic Results in last 30 days:  No results found.  Assessment/Plan 1. Benign hypertension with CKD (chronic kidney disease) stage III (HCC) Blood pressure well controlled -Continue on losartan,  metoprolol, hydralazine and furosemide - TSH; Future - COMPLETE METABOLIC PANEL WITH GFR; Future - CBC with Differential/Platelet; Future  2. AF (paroxysmal atrial fibrillation) (HCC) Heart rate control -Continue on apixaban 5 mg twice daily -No bleeding reported  3. Mixed hyperlipidemia LDL at goal -Continue simvastatin -Dietary modification and exercise advised will be going to the [facility] for exercises. - Lipid panel; Future  4. Vascular dementia without behavioral disturbance (HCC) No new behavioral issues reported -Continue with supportive care -Declined adult daycare programs -Continue on memantine and donepezil  5. Need for vaccination Afebrile Flut shot administered by CMA no acute reaction reported.  - Flu Vaccine QUAD High Dose(Fluad)  6. BMI 40.0-44.9, adult (HCC) BMI 43.08 associated with serious comorbidities hypertension  and hyperlipidemia -Dietary modification and exercise as above  7. Secondary hypercoagulable state ([place]) On apixaban 5 mg twice daily remains high risk for stroke thrombosis We will continue to monitor  8. Late effects of cerebral ischemic stroke Right-sided weakness secondary to stroke -Continue to control high risk factors -Exercise and dietary modification advised.Will be starting to exercise at the gym at the [facility]  Family/ staff Communication: Reviewed plan of care with patient and daughter verbalized understanding  Labs/tests ordered:  - CBC with Differential/Platelet - CMP with eGFR([facility]) - TSH - Hgb A1C - Lipid panel  Next Appointment : Return in about 6 months (around 04/22/2023) for medical mangement of chronic issues., Fasting labs in 6 months prior to visit.   Sandrea Hughs, NP

## 2022-10-26 NOTE — Assessment & Plan Note (Signed)
BMI 43.08 associated with serious comorbidity such as Hypertension and Hyperlipidemia   - dietary modification and exercise discussed at  length.

## 2022-10-26 NOTE — Assessment & Plan Note (Signed)
Remains high risk for stroke or thrombosis  Continue on Eliquis 5 mg tablet twice daily

## 2022-11-06 DIAGNOSIS — H348322 Tributary (branch) retinal vein occlusion, left eye, stable: Secondary | ICD-10-CM | POA: Diagnosis not present

## 2022-11-06 DIAGNOSIS — Z961 Presence of intraocular lens: Secondary | ICD-10-CM | POA: Diagnosis not present

## 2022-11-07 ENCOUNTER — Other Ambulatory Visit: Payer: Self-pay | Admitting: Family

## 2022-11-07 DIAGNOSIS — I10 Essential (primary) hypertension: Secondary | ICD-10-CM

## 2022-11-07 DIAGNOSIS — R600 Localized edema: Secondary | ICD-10-CM

## 2022-11-07 DIAGNOSIS — N183 Chronic kidney disease, stage 3 unspecified: Secondary | ICD-10-CM

## 2022-11-07 NOTE — Telephone Encounter (Signed)
Patient has request refills on medications Lasix, Losartan, and Potassium. Patient medications have High Risk Warnings. Medications pend and sent to PCP Ngetich, Nelda Bucks, NP.

## 2022-11-21 DIAGNOSIS — Z006 Encounter for examination for normal comparison and control in clinical research program: Secondary | ICD-10-CM

## 2022-11-24 NOTE — Research (Signed)
Alleviate HF Research Follow up  10 Month  No adverse events to report at this time. Losartan increased to 75 MG QD.   Current Outpatient Medications:    alendronate (FOSAMAX) 70 MG tablet, Take 1 tablet (70 mg total) by mouth every 7 (seven) days. Take with a full glass of water on an empty stomach., Disp: 4 tablet, Rfl: 11   apixaban (ELIQUIS) 5 MG TABS tablet, TAKE 1 TABLET TWICE DAILY, Disp: 180 tablet, Rfl: 1   Calcium Carb-Cholecalciferol (CALCIUM 600+D3 PO), Take 1 tablet by mouth 2 (two) times daily., Disp: , Rfl:    cholecalciferol (VITAMIN D) 1000 units tablet, Take 1,000 Units by mouth daily., Disp: , Rfl:    furosemide (LASIX) 20 MG tablet, As needed patient may take 20 MG Lasix PRN by mouth QD x 4 days as directed per [facility] HF Study PRN plan.  DO NOT REMOVE, Disp: 90 tablet, Rfl: 3   furosemide (LASIX) 20 MG tablet, TAKE 1 TABLET EVERY DAY, Disp: 90 tablet, Rfl: 3   hydrALAZINE (APRESOLINE) 25 MG tablet, TAKE 1 TABLET THREE TIMES DAILY WITH MEALS TO CONTROL BLOOD PRESSURE, Disp: 270 tablet, Rfl: 2   losartan (COZAAR) 50 MG tablet, TAKE 1 AND 1/2 TABLETS EVERY DAY, Disp: 135 tablet, Rfl: 3   Memantine HCl-Donepezil HCl (NAMZARIC) 28-10 MG CP24, TAKE 1 CAPSULE  DAILY TO PRESERVE MEMORY, Disp: 90 capsule, Rfl: 3   metoprolol tartrate (LOPRESSOR) 25 MG tablet, TAKE 1/2 TABLET TWICE DAILY, Disp: 90 tablet, Rfl: 2   Multiple Vitamin (MULTIVITAMIN) tablet, Take 1 tablet by mouth daily., Disp: , Rfl:    oxybutynin (DITROPAN) 5 MG tablet, TAKE 1 TABLET ONE TIME DAILY FOR BLADDER CONTROL, Disp: 90 tablet, Rfl: 2   potassium chloride SA (KLOR-CON M) 20 MEQ tablet, As needed patient may take 20 mEq Potassium PRN by mouth QD x 4 days as directed per [facility] HF Study PRN plan.  DO NOT REMOVE ORDER, Disp: 90 tablet, Rfl: 3   potassium chloride SA (KLOR-CON M) 20 MEQ tablet, TAKE 1 TABLET EVERY DAY, Disp: 90 tablet, Rfl: 3   prednisoLONE acetate (PRED FORTE) 1 % ophthalmic  suspension, Place 1 drop into the left eye as directed. Every 2-3 hours a day, Disp: , Rfl:    simvastatin (ZOCOR) 40 MG tablet, TAKE 1 TABLET EVERY DAY AT 6PM, Disp: 90 tablet, Rfl: 3  Current Facility-Administered Medications:    lidocaine-EPINEPHrine (XYLOCAINE W/EPI) 1 %-1:100000 (with pres) injection 10 mL, 10 mL, Infiltration, Once, Croitoru, Mihai, MD

## 2022-12-08 ENCOUNTER — Other Ambulatory Visit: Payer: Self-pay | Admitting: Family

## 2023-01-23 VITALS — BP 179/80 | HR 61 | Resp 14 | Wt 255.0 lb

## 2023-01-23 DIAGNOSIS — Z006 Encounter for examination for normal comparison and control in clinical research program: Secondary | ICD-10-CM

## 2023-01-23 MED ORDER — FUROSEMIDE 20 MG PO TABS: ORAL_TABLET | ORAL | 3 refills | Status: DC

## 2023-01-23 MED ORDER — POTASSIUM CHLORIDE CRYS ER 20 MEQ PO TBCR: EXTENDED_RELEASE_TABLET | ORAL | 3 refills | Status: DC

## 2023-01-23 NOTE — Addendum Note (Signed)
Addended by: Rubie Maid B on: 01/23/2023 02:08 PM   Modules accepted: Orders

## 2023-01-23 NOTE — Research (Signed)
Alleviate HF   12 month follow up  No adverse events or medication changes to report at this time.   Current Outpatient Medications:    alendronate (FOSAMAX) 70 MG tablet, Take 1 tablet (70 mg total) by mouth every 7 (seven) days. Take with a full glass of water on an empty stomach., Disp: 4 tablet, Rfl: 11   apixaban (ELIQUIS) 5 MG TABS tablet, TAKE 1 TABLET TWICE DAILY, Disp: 180 tablet, Rfl: 1   Calcium Carb-Cholecalciferol (CALCIUM 600+D3 PO), Take 1 tablet by mouth 2 (two) times daily., Disp: , Rfl:    cholecalciferol (VITAMIN D) 1000 units tablet, Take 1,000 Units by mouth daily., Disp: , Rfl:    furosemide (LASIX) 20 MG tablet, TAKE 1 TABLET EVERY DAY, Disp: 90 tablet, Rfl: 3   hydrALAZINE (APRESOLINE) 25 MG tablet, TAKE 1 TABLET THREE TIMES DAILY WITH MEALS TO CONTROL BLOOD PRESSURE, Disp: 270 tablet, Rfl: 2   Memantine HCl-Donepezil HCl (NAMZARIC) 28-10 MG CP24, TAKE 1 CAPSULE  DAILY TO PRESERVE MEMORY, Disp: 90 capsule, Rfl: 3   metoprolol tartrate (LOPRESSOR) 25 MG tablet, TAKE 1/2 TABLET TWICE DAILY, Disp: 90 tablet, Rfl: 2   Multiple Vitamin (MULTIVITAMIN) tablet, Take 1 tablet by mouth daily., Disp: , Rfl:    oxybutynin (DITROPAN) 5 MG tablet, TAKE 1 TABLET ONE TIME DAILY FOR BLADDER CONTROL, Disp: 90 tablet, Rfl: 2   potassium chloride SA (KLOR-CON M) 20 MEQ tablet, TAKE 1 TABLET EVERY DAY, Disp: 90 tablet, Rfl: 3   simvastatin (ZOCOR) 40 MG tablet, TAKE 1 TABLET EVERY DAY AT 6PM, Disp: 90 tablet, Rfl: 1   furosemide (LASIX) 20 MG tablet, As needed patient may take 20 MG Lasix PRN by mouth QD x 4 days as directed per Alleviate Research HF Study PRN plan.  DO NOT REMOVE, Disp: 90 tablet, Rfl: 3   losartan (COZAAR) 50 MG tablet, TAKE 1 AND 1/2 TABLETS EVERY DAY, Disp: 135 tablet, Rfl: 3   potassium chloride SA (KLOR-CON M) 20 MEQ tablet, As needed patient may take 20 mEq Potassium PRN by mouth QD x 4 days as directed per Alleviate Research HF Study PRN plan.  DO NOT REMOVE ORDER,  Disp: 90 tablet, Rfl: 3   prednisoLONE acetate (PRED FORTE) 1 % ophthalmic suspension, Place 1 drop into the left eye as directed. Every 2-3 hours a day (Patient not taking: Reported on 01/23/2023), Disp: , Rfl:   Current Facility-Administered Medications:    lidocaine-EPINEPHrine (XYLOCAINE W/EPI) 1 %-1:100000 (with pres) injection 10 mL, 10 mL, Infiltration, Once, Croitoru, Mihai, MD

## 2023-01-24 LAB — CBC
Hematocrit: 43.7 % (ref 34.0–46.6)
Hemoglobin: 14.1 g/dL (ref 11.1–15.9)
MCH: 28.4 pg (ref 26.6–33.0)
MCHC: 32.3 g/dL (ref 31.5–35.7)
MCV: 88 fL (ref 79–97)
Platelets: 237 10*3/uL (ref 150–450)
RBC: 4.97 x10E6/uL (ref 3.77–5.28)
RDW: 11.9 % (ref 11.7–15.4)
WBC: 7.5 10*3/uL (ref 3.4–10.8)

## 2023-01-24 LAB — BASIC METABOLIC PANEL
BUN/Creatinine Ratio: 10 — ABNORMAL LOW (ref 12–28)
BUN: 13 mg/dL (ref 8–27)
CO2: 24 mmol/L (ref 20–29)
Calcium: 9.3 mg/dL (ref 8.7–10.3)
Chloride: 104 mmol/L (ref 96–106)
Creatinine, Ser: 1.26 mg/dL — ABNORMAL HIGH (ref 0.57–1.00)
Glucose: 94 mg/dL (ref 70–99)
Potassium: 4.2 mmol/L (ref 3.5–5.2)
Sodium: 143 mmol/L (ref 134–144)
eGFR: 44 mL/min/{1.73_m2} — ABNORMAL LOW (ref >59)

## 2023-01-24 LAB — BRAIN NATRIURETIC PEPTIDE: BNP: 45.9 pg/mL (ref 0.0–100.0)

## 2023-01-26 NOTE — Progress Notes (Signed)
Mildly abnormal kidney function parameters at baseline. Normal BNP and CBC. Consistent with euvolemic status.

## 2023-01-26 NOTE — Progress Notes (Signed)
Alleviate labs for review

## 2023-03-04 ENCOUNTER — Other Ambulatory Visit: Payer: Self-pay | Admitting: Family

## 2023-03-04 ENCOUNTER — Other Ambulatory Visit (HOSPITAL_COMMUNITY): Payer: Self-pay | Admitting: Physician Assistant

## 2023-03-04 DIAGNOSIS — I480 Paroxysmal atrial fibrillation: Secondary | ICD-10-CM

## 2023-03-04 DIAGNOSIS — N183 Chronic kidney disease, stage 3 unspecified: Secondary | ICD-10-CM

## 2023-03-05 NOTE — Telephone Encounter (Signed)
Prescription refill request for Eliquis received. Indication: PAF Last office visit: 01/13/22 Jerilynn Mages Croitoru MD Scr: 1.26 on 01/23/23  Epic Age: 77 Weight: 115.7kg  Based on above findings Eliquis 5mg  twice daily is the appropriate dose.  Refill approved.  Pt is due for MD follow up.  Message sent to schedulers.

## 2023-04-08 ENCOUNTER — Other Ambulatory Visit: Payer: Self-pay | Admitting: Family

## 2023-04-08 DIAGNOSIS — M810 Age-related osteoporosis without current pathological fracture: Secondary | ICD-10-CM

## 2023-04-14 ENCOUNTER — Encounter: Payer: Self-pay | Admitting: Internal Medicine

## 2023-04-20 ENCOUNTER — Other Ambulatory Visit: Payer: Medicare HMO

## 2023-04-23 ENCOUNTER — Ambulatory Visit: Payer: Medicare HMO | Admitting: Family

## 2023-05-08 ENCOUNTER — Other Ambulatory Visit: Payer: Self-pay

## 2023-05-08 DIAGNOSIS — E782 Mixed hyperlipidemia: Secondary | ICD-10-CM

## 2023-05-08 DIAGNOSIS — I129 Hypertensive chronic kidney disease with stage 1 through stage 4 chronic kidney disease, or unspecified chronic kidney disease: Secondary | ICD-10-CM

## 2023-05-15 DIAGNOSIS — H401231 Low-tension glaucoma, bilateral, mild stage: Secondary | ICD-10-CM | POA: Diagnosis not present

## 2023-05-15 DIAGNOSIS — H26493 Other secondary cataract, bilateral: Secondary | ICD-10-CM | POA: Diagnosis not present

## 2023-05-15 DIAGNOSIS — H348322 Tributary (branch) retinal vein occlusion, left eye, stable: Secondary | ICD-10-CM | POA: Diagnosis not present

## 2023-05-15 DIAGNOSIS — Z961 Presence of intraocular lens: Secondary | ICD-10-CM | POA: Diagnosis not present

## 2023-05-15 DIAGNOSIS — H353131 Nonexudative age-related macular degeneration, bilateral, early dry stage: Secondary | ICD-10-CM | POA: Diagnosis not present

## 2023-05-22 ENCOUNTER — Other Ambulatory Visit: Payer: Medicare HMO

## 2023-05-22 DIAGNOSIS — N183 Chronic kidney disease, stage 3 unspecified: Secondary | ICD-10-CM | POA: Diagnosis not present

## 2023-05-22 DIAGNOSIS — I129 Hypertensive chronic kidney disease with stage 1 through stage 4 chronic kidney disease, or unspecified chronic kidney disease: Secondary | ICD-10-CM | POA: Diagnosis not present

## 2023-05-22 DIAGNOSIS — E782 Mixed hyperlipidemia: Secondary | ICD-10-CM | POA: Diagnosis not present

## 2023-05-23 LAB — COMPLETE METABOLIC PANEL WITH GFR
AG Ratio: 1.2 (calc) (ref 1.0–2.5)
ALT: 11 U/L (ref 6–29)
AST: 14 U/L (ref 10–35)
Albumin: 3.8 g/dL (ref 3.6–5.1)
Alkaline phosphatase (APISO): 72 U/L (ref 37–153)
BUN/Creatinine Ratio: 12 (calc) (ref 6–22)
BUN: 16 mg/dL (ref 7–25)
CO2: 28 mmol/L (ref 20–32)
Calcium: 9.5 mg/dL (ref 8.6–10.4)
Chloride: 104 mmol/L (ref 98–110)
Creat: 1.31 mg/dL — ABNORMAL HIGH (ref 0.60–1.00)
Globulin: 3.1 g/dL (calc) (ref 1.9–3.7)
Glucose, Bld: 106 mg/dL — ABNORMAL HIGH (ref 65–99)
Potassium: 3.9 mmol/L (ref 3.5–5.3)
Sodium: 142 mmol/L (ref 135–146)
Total Bilirubin: 0.6 mg/dL (ref 0.2–1.2)
Total Protein: 6.9 g/dL (ref 6.1–8.1)
eGFR: 41 mL/min/{1.73_m2} — ABNORMAL LOW (ref >=60)

## 2023-05-23 LAB — LIPID PANEL
Cholesterol: 156 mg/dL (ref <200)
HDL: 69 mg/dL (ref >=50)
LDL Cholesterol (Calc): 71 mg/dL (calc)
Non-HDL Cholesterol (Calc): 87 mg/dL (calc) (ref <130)
Total CHOL/HDL Ratio: 2.3 (calc) (ref <5.0)
Triglycerides: 76 mg/dL (ref <150)

## 2023-05-23 LAB — CBC WITH DIFFERENTIAL/PLATELET
Absolute Monocytes: 744 cells/uL (ref 200–950)
Basophils Absolute: 40 cells/uL (ref 0–200)
Basophils Relative: 0.5 %
Eosinophils Absolute: 104 cells/uL (ref 15–500)
Eosinophils Relative: 1.3 %
HCT: 41.9 % (ref 35.0–45.0)
Hemoglobin: 13.6 g/dL (ref 11.7–15.5)
Lymphs Abs: 4392 cells/uL — ABNORMAL HIGH (ref 850–3900)
MCH: 28.7 pg (ref 27.0–33.0)
MCHC: 32.5 g/dL (ref 32.0–36.0)
MCV: 88.4 fL (ref 80.0–100.0)
MPV: 10.2 fL (ref 7.5–12.5)
Monocytes Relative: 9.3 %
Neutro Abs: 2720 cells/uL (ref 1500–7800)
Neutrophils Relative %: 34 %
Platelets: 229 10*3/uL (ref 140–400)
RBC: 4.74 10*6/uL (ref 3.80–5.10)
RDW: 12.2 % (ref 11.0–15.0)
Total Lymphocyte: 54.9 %
WBC: 8 10*3/uL (ref 3.8–10.8)

## 2023-05-23 LAB — TSH: TSH: 2.02 mIU/L (ref 0.40–4.50)

## 2023-05-25 ENCOUNTER — Other Ambulatory Visit: Payer: Medicare HMO

## 2023-05-26 ENCOUNTER — Encounter: Payer: Self-pay | Admitting: Family

## 2023-05-26 ENCOUNTER — Ambulatory Visit (INDEPENDENT_AMBULATORY_CARE_PROVIDER_SITE_OTHER): Payer: Medicare HMO | Admitting: Family

## 2023-05-26 VITALS — BP 159/86 | HR 47 | Temp 97.1°F | Resp 18 | Ht 64.0 in | Wt 247.0 lb

## 2023-05-26 DIAGNOSIS — I480 Paroxysmal atrial fibrillation: Secondary | ICD-10-CM

## 2023-05-26 DIAGNOSIS — I129 Hypertensive chronic kidney disease with stage 1 through stage 4 chronic kidney disease, or unspecified chronic kidney disease: Secondary | ICD-10-CM | POA: Diagnosis not present

## 2023-05-26 DIAGNOSIS — E782 Mixed hyperlipidemia: Secondary | ICD-10-CM

## 2023-05-26 DIAGNOSIS — F0150 Vascular dementia without behavioral disturbance: Secondary | ICD-10-CM

## 2023-05-26 DIAGNOSIS — I10 Essential (primary) hypertension: Secondary | ICD-10-CM

## 2023-05-26 DIAGNOSIS — N183 Chronic kidney disease, stage 3 unspecified: Secondary | ICD-10-CM | POA: Diagnosis not present

## 2023-05-26 MED ORDER — LOSARTAN POTASSIUM 100 MG PO TABS
100.0000 mg | ORAL_TABLET | Freq: Every day | ORAL | 1 refills | Status: DC
Start: 2023-05-26 — End: 2024-01-25

## 2023-05-26 NOTE — Patient Instructions (Signed)
Please get Tdap and shingles vaccines at the pharmacy

## 2023-05-26 NOTE — Progress Notes (Signed)
Provider: Richarda Blade FNP-C   Davidson Palmieri, Donalee Citrin, NP  Patient Care Team: Elden Brucato, Donalee Citrin, NP as PCP - General (Family Medicine) Mateo Flow, MD as Consulting Physician (Ophthalmology)  Extended Emergency Contact Information Primary Emergency Contact: Fornwalt,[name] Address: [address] [place] [address] [place] Home Phone: [phone] Mobile Phone: [phone] Relation: Daughter  Code Status:  Full Code  Goals of care: Advanced Directive information    06/05/2022   12:33 PM  Advanced Directives  Does Patient Have a Medical Advance Directive? Yes  Type of Risk manager Complaint  Patient presents with   Medical Management of Chronic Issues    Patient is here for a follow up for chronic conditions    Quality Metric Gaps    Patient due for Tdap, AWV, Hep C screening, and discuss need for shingrix vaccine    HPI:  Pt is a 77 y.o. female seen today for 6 months follow up for medical management of chronic diseases.    Hypertension - Home B/p runs 140's -150 /80's consistent with today's readings.She denies any headache,dizziness,vision changes,fatigue,chest tightness,palpitation,chest pain or shortness of breath.     Hyperlipidemia - Recent lab results improved from previous visit.Doing well on simvastatin denies any muscle weakness or cramps.   Afib - on Eliquis.she denies any signs of bleeding or dark stool.Also no palpitation.   Dementia - continues to require assistance with ADL's assisted by daughter.   Due for Tdap and shingles vaccines.Aware to get at the pharmacy. Hep C and  Past Medical History:  Diagnosis Date   Cataract    slow growing    Chronic kidney disease    Hyperlipidemia    Hypertension    Insomnia, unspecified    Memory loss    mild dementia per daughter    Osteopenia    Other abnormal blood chemistry    Other specified cardiac dysrhythmias(427.89)    Other  specified disease of nail    Stroke (HCC)    2006 or 2007 per daughter    Syncope and collapse    Unspecified disorder of kidney and ureter    Unspecified late effects of cerebrovascular disease    Unspecified transient cerebral ischemia    Unspecified urinary incontinence    Unspecified vitamin D deficiency    Vascular dementia, uncomplicated (HCC)    Past Surgical History:  Procedure Laterality Date   CATARACT EXTRACTION Left 09/2022   CATARACT EXTRACTION Right 11/2021   NO PAST SURGERIES      No Known Allergies  Allergies as of 05/26/2023   No Known Allergies      Medication List        Accurate as of May 26, 2023  1:51 PM. If you have any questions, ask your nurse or doctor.          STOP taking these medications    prednisoLONE acetate 1 % ophthalmic suspension Commonly known as: PRED FORTE Stopped by: Donalee Citrin Indie Boehne, NP       TAKE these medications    alendronate 70 MG tablet Commonly known as: FOSAMAX TAKE 1 TABLET EVERY SEVEN DAYS. TAKE WITH A FULL GLASS OF WATER ON AN EMPTY STOMACH.   CALCIUM 600+D3 PO Take 1 tablet by mouth 2 (two) times daily.   cholecalciferol 1000 units tablet Commonly known as: VITAMIN D Take 1,000 Units by mouth daily.   Eliquis 5 MG  Tabs tablet Generic drug: apixaban TAKE 1 TABLET TWICE DAILY   furosemide 20 MG tablet Commonly known as: LASIX TAKE 1 TABLET EVERY DAY   furosemide 20 MG tablet Commonly known as: LASIX As needed patient may take 20 MG Lasix PRN by mouth QD x 4 days as directed per [employer] HF Study PRN plan. DO NOT REMOVE   hydrALAZINE 25 MG tablet Commonly known as: APRESOLINE TAKE 1 TABLET THREE TIMES DAILY WITH MEALS TO CONTROL BLOOD PRESSURE   losartan 50 MG tablet Commonly known as: COZAAR TAKE 1 AND 1/2 TABLETS EVERY DAY   metoprolol tartrate 25 MG tablet Commonly known as: LOPRESSOR TAKE 1/2 TABLET TWICE DAILY   multivitamin tablet Take 1 tablet by mouth daily.    Namzaric 28-10 MG Cp24 Generic drug: Memantine HCl-Donepezil HCl TAKE 1 CAPSULE  DAILY TO PRESERVE MEMORY   oxybutynin 5 MG tablet Commonly known as: DITROPAN TAKE 1 TABLET ONE TIME DAILY FOR BLADDER CONTROL   potassium chloride SA 20 MEQ tablet Commonly known as: KLOR-CON M TAKE 1 TABLET EVERY DAY   potassium chloride SA 20 MEQ tablet Commonly known as: KLOR-CON M As needed patient may take 20 mEq Potassium PRN by mouth QD x 4 days as directed per [employer] HF Study PRN plan. DO NOT REMOVE ORDER   simvastatin 40 MG tablet Commonly known as: ZOCOR TAKE 1 TABLET EVERY DAY AT 6PM        Review of Systems  Immunization History  Administered Date(s) Administered   Covid-19, Mrna,Vaccine(Spikevax)67yrs and older 10/05/2022   Fluad Quad(high Dose 65+) 11/22/2020, 09/06/2021, 10/22/2022   Influenza, High Dose Seasonal PF 12/25/2017, 08/19/2018   Influenza,inj,Quad PF,6+ Mos 10/17/2013, 12/07/2015, 09/01/2016   Influenza-Unspecified 08/25/2008, 09/12/2011, 09/27/2012   PFIZER(Purple Top)SARS-COV-2 Vaccination 12/27/2019, 01/17/2020   Pneumococcal Conjugate-13 08/04/2014   Pneumococcal Polysaccharide-23 03/21/2022   Pneumococcal-Unspecified 05/07/2011   Respiratory Syncytial Virus Vaccine,Recomb Aduvanted(Arexvy) 10/05/2022   Tdap 05/07/2011   Zoster Recombinat (Shingrix) 10/05/2022   Pertinent  Health Maintenance Due  Topic Date Due   INFLUENZA VACCINE  07/09/2023   DEXA SCAN  Completed      12/27/2021    3:23 PM 03/21/2022    1:02 PM 05/12/2022    1:43 PM 06/05/2022   12:33 PM 10/22/2022    1:02 PM  Fall Risk  Falls in the past year? 0 1 1 1 1   Was there an injury with Fall? 0 0 0 0 0  Fall Risk Category Calculator 0 1 1 1 1   Fall Risk Category (Retired) Low Low Low Low Low  (RETIRED) Patient Fall Risk Level Low fall risk Low fall risk Low fall risk  Low fall risk  Patient at Risk for Falls Due to No Fall Risks No Fall Risks History of fall(s)  No Fall Risks   Fall risk Follow up Falls evaluation completed;Education provided;Falls prevention discussed Falls evaluation completed Falls evaluation completed;Education provided;Falls prevention discussed  Falls evaluation completed   Functional Status Survey:    Vitals:   05/26/23 1334  BP: (!) 159/86  Pulse: (!) 47  Resp: 18  Temp: (!) 97.1 F (36.2 C)  SpO2: 99%  Weight: 247 lb (112 kg)  Height: 5\' 4"  (1.626 m)   Body mass index is 42.4 kg/m. Physical Exam  Labs reviewed: Recent Labs    10/20/22 1015 01/23/23 1324 05/22/23 1008  NA 144 143 142  K 4.0 4.2 3.9  CL 106 104 104  CO2 28 24 28   GLUCOSE 97 94 106*  BUN 15 13 16   CREATININE 1.22* 1.26* 1.31*  CALCIUM 9.3 9.3 9.5   Recent Labs    10/20/22 1015 05/22/23 1008  AST 15 14  ALT 12 11  BILITOT 0.6 0.6  PROT 6.7 6.9   Recent Labs    10/20/22 1015 01/23/23 1324 05/22/23 1008  WBC 6.9 7.5 8.0  NEUTROABS 3,209  --  2,720  HGB 14.2 14.1 13.6  HCT 43.2 43.7 41.9  MCV 88.9 88 88.4  PLT 257 237 229   Lab Results  Component Value Date   TSH 2.02 05/22/2023   Lab Results  Component Value Date   HGBA1C 5.4 05/02/2021   Lab Results  Component Value Date   CHOL 156 05/22/2023   HDL 69 05/22/2023   LDLCALC 71 05/22/2023   TRIG 76 05/22/2023   CHOLHDL 2.3 05/22/2023    Significant Diagnostic Results in last 30 days:  No results found.  Assessment/Plan  1. Benign hypertension with CKD (chronic kidney disease) stage III (HCC) B/p elevated this visit consistent with reported home readings. - increase losartan from 75 mg to 100 mg tablet daily.may use 2 tabs of current medication until gone.  - Advised to check Blood pressure at home and record on log provided and notify provider if B/p > 140/90  - follow up in 2 weeks to recheck Blood pressure  - TSH; Future - COMPLETE METABOLIC PANEL WITH GFR; Future - CBC with Differential/Platelet; Future - losartan (COZAAR) 100 MG tablet; Take 1 tablet (100 mg total)  by mouth daily.  Dispense: 90 tablet; Refill: 1  2. Mixed hyperlipidemia LDL at goal  - continue on simvastatin  - Lipid panel; Future  3. AF (paroxysmal atrial fibrillation) (HCC) HR controlled  - continue on Eliquis and Metoprolol - CBC with Differential/Platelet; Future  4. Vascular dementia without behavioral disturbance (HCC) No behavioral issues  - continue with supportive care   Family/ staff Communication: Reviewed plan of care with patient verbalized understanding   Labs/tests ordered:  - CBC with Differential/Platelet - CMP with eGFR([facility]) - TSH - Lipid panel  Next Appointment : Return in about 6 months (around 11/25/2023) for medical mangement of chronic issues., Fasting labs in 6 months prior to visit.2weeks for B/p .   Caesar Bookman, NP

## 2023-06-07 ENCOUNTER — Encounter: Payer: Self-pay | Admitting: Cardiovascular Disease

## 2023-06-07 NOTE — Progress Notes (Unsigned)
Cardiology Office Note:    Date:  06/08/2023   ID:  [name], DOB 05-18-1946, MRN [id number]  PCP:  Caesar Bookman, NP   [facility] Providers Cardiologist:  None {  Referring MD: Caesar Bookman, NP   Chief Complaint  [name] presents with   Atrial Fibrillation         History of Present Illness:    [name] is a 77 y.o. [name] with a hx of atrial fib, CKD, HLD, HTN, dementia , Seen with dauhgter, Victorino Dike.   We were asked to see [name] by Richarda Blade, NP  for further evaluation of her atrial fib  She was diagnosed with atrial flutter, atrial fib on 11/29/ 22. She converted spontaneously to sinus rhythm , is on eliquis , metoprolol 12.5 BID   Her daughter says that she is more short of breath when she is in atrial fib.   No CP , no dizziness,    HR is a bit slow but she is asymptomatic  No issues today .   Daughter answers almost all [name] questinons    June 08, 2023 [name] is seen for follow up of her paroxysmal atril fib, obesity, HTN, dementia  Seen with daughter, Victorino Dike     Past Medical History:  Diagnosis Date   Cataract    slow growing    Chronic kidney disease    Hyperlipidemia    Hypertension    Insomnia, unspecified    Memory loss    mild dementia per daughter    Osteopenia    Other abnormal blood chemistry    Other specified cardiac dysrhythmias(427.89)    Other specified disease of nail    Stroke (HCC)    2006 or 2007 per daughter    Syncope and collapse    Unspecified disorder of kidney and ureter    Unspecified late effects of cerebrovascular disease    Unspecified transient cerebral ischemia    Unspecified urinary incontinence    Unspecified vitamin D deficiency    Vascular dementia, uncomplicated (HCC)     Past Surgical History:  Procedure Laterality Date   CATARACT EXTRACTION Left 09/2022   CATARACT EXTRACTION Right 11/2021   NO PAST SURGERIES      Current Medications: Current Meds  Medication Sig    alendronate (FOSAMAX) 70 MG tablet TAKE 1 TABLET EVERY SEVEN DAYS. TAKE WITH A FULL GLASS OF WATER ON AN EMPTY STOMACH.   apixaban (ELIQUIS) 5 MG TABS tablet TAKE 1 TABLET TWICE DAILY   Calcium Carb-Cholecalciferol (CALCIUM 600+D3 PO) Take 1 tablet by mouth 2 (two) times daily.   cholecalciferol (VITAMIN D) 1000 units tablet Take 1,000 Units by mouth daily.   furosemide (LASIX) 20 MG tablet TAKE 1 TABLET EVERY DAY   furosemide (LASIX) 20 MG tablet As needed [name] may take 20 MG Lasix PRN by mouth QD x 4 days as directed per [employer] HF Study PRN plan. DO NOT REMOVE   hydrALAZINE (APRESOLINE) 25 MG tablet TAKE 1 TABLET THREE TIMES DAILY WITH MEALS TO CONTROL BLOOD PRESSURE   losartan (COZAAR) 100 MG tablet Take 1 tablet (100 mg total) by mouth daily.   Memantine HCl-Donepezil HCl (NAMZARIC) 28-10 MG CP24 TAKE 1 CAPSULE  DAILY TO PRESERVE MEMORY   metoprolol tartrate (LOPRESSOR) 25 MG tablet TAKE 1/2 TABLET TWICE DAILY   Multiple Vitamin (MULTIVITAMIN) tablet Take 1 tablet by mouth daily.   oxybutynin (DITROPAN) 5 MG tablet TAKE 1 TABLET ONE TIME DAILY FOR BLADDER  CONTROL   potassium chloride SA (KLOR-CON M) 20 MEQ tablet TAKE 1 TABLET EVERY DAY   potassium chloride SA (KLOR-CON M) 20 MEQ tablet As needed [name] may take 20 mEq Potassium PRN by mouth QD x 4 days as directed per [employer] HF Study PRN plan. DO NOT REMOVE ORDER   simvastatin (ZOCOR) 40 MG tablet TAKE 1 TABLET EVERY DAY AT 6PM   Current Facility-Administered Medications for [name] 06/08/23 encounter (Office Visit) with Vali Capano, Deloris Ping, MD  Medication   lidocaine-EPINEPHrine (XYLOCAINE W/EPI) 1 %-1:100000 (with pres) injection 10 mL     Allergies:   [name] has no known allergies.   Social History   Socioeconomic History   Marital status: Single    Spouse name: Not on file   Number of children: Not on file   Years of education: Not on file   Highest education level: Not on file  Occupational History   Not  on file  Tobacco Use   Smoking status: Former    Years: 30    Types: Cigarettes    Quit date: 12/08/2006    Years since quitting: 16.5   Smokeless tobacco: Never  Vaping Use   Vaping Use: Never used  Substance and Sexual Activity   Alcohol use: No   Drug use: No   Sexual activity: Never  Other Topics Concern   Not on file  Social History Narrative   Not on file   Social Determinants of Health   Financial Resource Strain: Low Risk  (12/25/2017)   Overall Financial Resource Strain (CARDIA)    Difficulty of Paying Living Expenses: Not hard at all  Food Insecurity: No Food Insecurity (12/25/2017)   Hunger Vital Sign    Worried About Running Out of Food in [name] Last Year: Never true    Ran Out of Food in [name] Last Year: Never true  Transportation Needs: No Transportation Needs (12/25/2017)   PRAPARE - Administrator, Civil Service (Medical): No    Lack of Transportation (Non-Medical): No  Physical Activity: Inactive (12/25/2017)   Exercise Vital Sign    Days of Exercise per Week: 0 days    Minutes of Exercise per Session: 0 min  Stress: No Stress Concern Present (12/25/2017)   [employer] of Occupational Health - Occupational Stress Questionnaire    Feeling of Stress : Only a little  Social Connections: Somewhat Isolated (12/25/2017)   Social Connection and Isolation Panel [NHANES]    Frequency of Communication with Friends and Family: Once a week    Frequency of Social Gatherings with Friends and Family: More than three times a week    Attends Religious Services: 1 to 4 times per year    Active Member of [employer] or Organizations: No    Attends Banker Meetings: Never    Marital Status: Never married     Family History: [name]'s family history includes Hypertension in her brother; Kidney disease in her sister; Pneumonia in her mother. There is no history of Colon cancer, Colon polyps, Esophageal cancer, Rectal cancer, or Stomach cancer.  ROS:    Please see [name] history of present illness.     All other systems reviewed and are negative.  EKGs/Labs/Other Studies Reviewed:    [name] following studies were reviewed today:   EKG:   EKG Interpretation Date/Time:  Monday June 08 2023 15:33:27 EDT Ventricular Rate:  54 PR Interval:  186 QRS Duration:  80 QT Interval:  486 QTC Calculation: 460 R  Axis:   59  Text Interpretation: Sinus bradycardia When compared with ECG of 13-Nov-2021 15:27, No significant change was found Confirmed by Kristeen Miss [phone]) on 06/08/2023 3:42:43 PM  EKG Interpretation Date/Time:  Monday June 08 2023 15:33:27 EDT Ventricular Rate:  54 PR Interval:  186 QRS Duration:  80 QT Interval:  486 QTC Calculation: 460 R Axis:   59  Text Interpretation: Sinus bradycardia When compared with ECG of 13-Nov-2021 15:27, No significant change was found Confirmed by Kristeen Miss ([id number]) on 06/08/2023 3:42:43 PM    Recent Labs: 01/23/2023: BNP 45.9 05/22/2023: ALT 11; BUN 16; Creat 1.31; Hemoglobin 13.6; Platelets 229; Potassium 3.9; Sodium 142; TSH 2.02  Recent Lipid Panel    Component Value Date/Time   CHOL 156 05/22/2023 1008   CHOL 178 12/07/2015 1100   TRIG 76 05/22/2023 1008   HDL 69 05/22/2023 1008   HDL 81 12/07/2015 1100   CHOLHDL 2.3 05/22/2023 1008   VLDL 8 09/01/2016 0843   LDLCALC 71 05/22/2023 1008     Risk Assessment/Calculations:    CHA2DS2-VASc Score =     This indicates a  % annual risk of stroke. [name]'s score is based upon:          Physical Exam:     Physical Exam: Blood pressure 139/80, pulse (!) 54, height 5\' 4"  (1.626 m), weight 247 lb (112 kg), SpO2 98 %.       GEN:  [name],  moderately obese, examined in [name] wheelchair HEENT: Normal NECK: No JVD; No carotid bruits LYMPHATICS: No lymphadenopathy CARDIAC: RRR , no murmurs, rubs, gallops RESPIRATORY:  Clear to auscultation without rales, wheezing or rhonchi  ABDOMEN: Soft, non-tender,  non-distended MUSCULOSKELETAL:  No edema; No deformity  SKIN: Warm and dry NEUROLOGIC:  Alert and oriented x 3    ASSESSMENT:    1. AF (paroxysmal atrial fibrillation) (HCC)   2. Benign hypertension with CKD (chronic kidney disease) stage III (HCC)     PLAN:       Paroxysmal atrial fibrillation: She is revealed of paroxysmal atrial fibrillation.  She is currently in normal sinus rhythm.  Continue Eliquis.  2.  Hypertension:   Blood pressure is fairly well-controlled.  Continue current medications.   3.  Obesity:    continue weight loss efforts   4.  PVCs:               Medication Adjustments/Labs and Tests Ordered: Current medicines are reviewed at length with [name] today.  Concerns regarding medicines are outlined above.  Orders Placed This Encounter  Procedures   EKG 12-Lead   No orders of [name] defined types were placed in this encounter.   There are no [name] Instructions on file for this visit.   Signed, Kristeen Miss, MD  06/08/2023 3:46 PM    [facility]

## 2023-06-08 ENCOUNTER — Encounter: Payer: Self-pay | Admitting: Cardiovascular Disease

## 2023-06-08 ENCOUNTER — Ambulatory Visit: Payer: Medicare HMO | Attending: Cardiovascular Disease | Admitting: Cardiovascular Disease

## 2023-06-08 ENCOUNTER — Encounter: Payer: Medicare HMO | Admitting: Family

## 2023-06-08 VITALS — BP 139/80 | HR 54 | Ht 64.0 in | Wt 247.0 lb

## 2023-06-08 DIAGNOSIS — I480 Paroxysmal atrial fibrillation: Secondary | ICD-10-CM | POA: Diagnosis not present

## 2023-06-08 DIAGNOSIS — I129 Hypertensive chronic kidney disease with stage 1 through stage 4 chronic kidney disease, or unspecified chronic kidney disease: Secondary | ICD-10-CM

## 2023-06-08 DIAGNOSIS — N183 Chronic kidney disease, stage 3 unspecified: Secondary | ICD-10-CM

## 2023-06-08 NOTE — Patient Instructions (Signed)
Medication Instructions:  Your physician recommends that you continue on your current medications as directed. Please refer to the Current Medication list given to you today.  *If you need a refill on your cardiac medications before your next appointment, please call your pharmacy*   Lab Work: None If you have labs (blood work) drawn today and your tests are completely normal, you will receive your results only by: MyChart Message (if you have MyChart) OR A paper copy in the mail If you have any lab test that is abnormal or we need to change your treatment, we will call you to review the results.   Testing/Procedures: None   Follow-Up: At [facility], you and your health needs are our priority.  As part of our continuing mission to provide you with exceptional heart care, we have created designated Provider Care Teams.  These Care Teams include your primary Cardiologist (physician) and Advanced Practice Providers (APPs -  Physician Assistants and Nurse Practitioners) who all work together to provide you with the care you need, when you need it.   Your next appointment:   1 year(s)  Provider:   Philip Nahser, MD   

## 2023-06-09 ENCOUNTER — Other Ambulatory Visit: Payer: Self-pay | Admitting: Family

## 2023-06-15 ENCOUNTER — Other Ambulatory Visit: Payer: Self-pay

## 2023-06-15 ENCOUNTER — Encounter: Payer: Self-pay | Admitting: Family

## 2023-06-15 ENCOUNTER — Telehealth (INDEPENDENT_AMBULATORY_CARE_PROVIDER_SITE_OTHER): Payer: Medicare HMO | Admitting: Family

## 2023-06-15 DIAGNOSIS — Z0000 Encounter for general adult medical examination without abnormal findings: Secondary | ICD-10-CM

## 2023-06-15 NOTE — Progress Notes (Signed)
Subjective:   [name] is a 77 y.o. female who presents for Medicare Annual (Subsequent) preventive examination.  Visit Complete: In person  Patient Medicare AWV questionnaire was completed by the patient on 06/15/2023; I have confirmed that all information answered by patient is correct and no changes since this date.  Review of Systems     Cardiac Risk Factors include: advanced age (>55men, >77 women);hypertension;obesity (BMI >30kg/m2);smoking/ tobacco exposure     Objective:    There were no vitals filed for this visit. There is no height or weight on file to calculate BMI.     06/15/2023   12:52 PM 06/05/2022   12:33 PM 05/12/2022    1:44 PM 03/21/2022    1:03 PM 11/14/2021    3:03 PM 09/06/2021   11:02 AM 05/28/2021    1:57 PM  Advanced Directives  Does Patient Have a Medical Advance Directive? Yes Yes Yes Yes No No Yes  Type of Sales promotion account executive of [employer] Power of [employer] Power of Attorney   Healthcare Power of Attorney  Does patient want to make changes to medical advance directive? No - Patient declined  No - Patient declined No - Patient declined   No - Patient declined  Copy of Healthcare Power of Attorney in Chart? No - copy requested  No - copy requested No - copy requested    No - copy requested  Would patient like information on creating a medical advance directive?     No - Patient declined No - Patient declined No - Patient declined     Significant value    Current Medications (verified) Outpatient Encounter Medications as of 06/15/2023  Medication Sig   alendronate (FOSAMAX) 70 MG tablet TAKE 1 TABLET EVERY SEVEN DAYS. TAKE WITH A FULL GLASS OF WATER ON AN EMPTY STOMACH.   apixaban (ELIQUIS) 5 MG TABS tablet TAKE 1 TABLET TWICE DAILY   Calcium Carb-Cholecalciferol (CALCIUM 600+D3 PO) Take 1 tablet by mouth 2 (two) times daily.   cholecalciferol (VITAMIN D) 1000 units tablet Take 1,000 Units  by mouth daily.   furosemide (LASIX) 20 MG tablet TAKE 1 TABLET EVERY DAY   furosemide (LASIX) 20 MG tablet As needed patient may take 20 MG Lasix PRN by mouth QD x 4 days as directed per [employer] HF Study PRN plan. DO NOT REMOVE   hydrALAZINE (APRESOLINE) 25 MG tablet TAKE 1 TABLET THREE TIMES DAILY WITH MEALS TO CONTROL BLOOD PRESSURE   losartan (COZAAR) 100 MG tablet Take 1 tablet (100 mg total) by mouth daily.   Memantine HCl-Donepezil HCl (NAMZARIC) 28-10 MG CP24 TAKE 1 CAPSULE  DAILY TO PRESERVE MEMORY   metoprolol tartrate (LOPRESSOR) 25 MG tablet TAKE 1/2 TABLET TWICE DAILY   Multiple Vitamin (MULTIVITAMIN) tablet Take 1 tablet by mouth daily.   oxybutynin (DITROPAN) 5 MG tablet TAKE 1 TABLET ONE TIME DAILY FOR BLADDER CONTROL   potassium chloride SA (KLOR-CON M) 20 MEQ tablet TAKE 1 TABLET EVERY DAY   potassium chloride SA (KLOR-CON M) 20 MEQ tablet As needed patient may take 20 mEq Potassium PRN by mouth QD x 4 days as directed per [employer] HF Study PRN plan. DO NOT REMOVE ORDER   simvastatin (ZOCOR) 40 MG tablet TAKE 1 TABLET EVERY DAY AT 6PM   Facility-Administered Encounter Medications as of 06/15/2023  Medication   lidocaine-EPINEPHrine (XYLOCAINE W/EPI) 1 %-1:100000 (with pres) injection 10 mL    Allergies (verified) Patient has  no known allergies.   History: Past Medical History:  Diagnosis Date   Cataract    slow growing    Chronic kidney disease    Hyperlipidemia    Hypertension    Insomnia, unspecified    Memory loss    mild dementia per daughter    Osteopenia    Other abnormal blood chemistry    Other specified cardiac dysrhythmias(427.89)    Other specified disease of nail    Stroke (HCC)    2006 or 2007 per daughter    Syncope and collapse    Unspecified disorder of kidney and ureter    Unspecified late effects of cerebrovascular disease    Unspecified transient cerebral ischemia    Unspecified urinary incontinence    Unspecified  vitamin D deficiency    Vascular dementia, uncomplicated (HCC)    Past Surgical History:  Procedure Laterality Date   CATARACT EXTRACTION Left 09/2022   CATARACT EXTRACTION Right 11/2021   NO PAST SURGERIES     Family History  Problem Relation Age of Onset   Pneumonia Mother    Kidney disease Sister    Hypertension Brother    Colon cancer Neg Hx    Colon polyps Neg Hx    Esophageal cancer Neg Hx    Rectal cancer Neg Hx    Stomach cancer Neg Hx    Social History   Socioeconomic History   Marital status: Single    Spouse name: Not on file   Number of children: Not on file   Years of education: Not on file   Highest education level: Not on file  Occupational History   Not on file  Tobacco Use   Smoking status: Former    Years: 30    Types: Cigarettes    Quit date: 12/08/2006    Years since quitting: 16.5   Smokeless tobacco: Never  Vaping Use   Vaping Use: Never used  Substance and Sexual Activity   Alcohol use: No   Drug use: No   Sexual activity: Never  Other Topics Concern   Not on file  Social History Narrative   Not on file   Social Determinants of Health   Financial Resource Strain: Low Risk  (12/25/2017)   Overall Financial Resource Strain (CARDIA)    Difficulty of Paying Living Expenses: Not hard at all  Food Insecurity: No Food Insecurity (12/25/2017)   Hunger Vital Sign    Worried About Running Out of Food in the Last Year: Never true    Ran Out of Food in the Last Year: Never true  Transportation Needs: No Transportation Needs (12/25/2017)   PRAPARE - Administrator, Civil Service (Medical): No    Lack of Transportation (Non-Medical): No  Physical Activity: Inactive (12/25/2017)   Exercise Vital Sign    Days of Exercise per Week: 0 days    Minutes of Exercise per Session: 0 min  Stress: No Stress Concern Present (12/25/2017)   [employer] of Occupational Health - Occupational Stress Questionnaire    Feeling of Stress : Only a little   Social Connections: Somewhat Isolated (12/25/2017)   Social Connection and Isolation Panel [NHANES]    Frequency of Communication with Friends and Family: Once a week    Frequency of Social Gatherings with Friends and Family: More than three times a week    Attends Religious Services: 1 to 4 times per year    Active Member of [employer] or Organizations: No    Attends [employer]  or Organization Meetings: Never    Marital Status: Never married    Tobacco Counseling Counseling given: Not Answered   Clinical Intake:  Pre-visit preparation completed: No  Pain : No/denies pain     BMI - recorded: 42.4 Nutritional Status: BMI > 30  Obese Nutritional Risks: None Diabetes: No  How often do you need to have someone help you when you read instructions, pamphlets, or other written materials from your doctor or pharmacy?: 5 - Always (daughter assit) What is the last grade level you completed in school?: college 2 yrs  Interpreter Needed?: No   Activities of Daily Living    06/15/2023    1:14 PM 06/15/2023   12:54 PM  In your present state of health, do you have any difficulty performing the following activities:  Hearing? 0 0  Vision? 0 0  Difficulty concentrating or making decisions? 1 0  Comment [place]   Walking or climbing stairs? 1 0  Dressing or bathing? 0 0  Doing errands, shopping? 1 0  Comment daughter Advice worker and eating ? Y   Using the Toilet? N   In the past six months, have you accidently leaked urine? Y   Do you have problems with loss of bowel control? N   Managing your Medications? Y   Comment daughter assist   Managing your Finances? Y   Comment daughter assist   Housekeeping or managing your Housekeeping? Y   Comment daughter assist     Patient Care Team: Shaneika Rossa, Donalee Citrin, NP as PCP - General (Family Medicine) Nahser, Deloris Ping, MD as PCP - Cardiology (Cardiology) Mateo Flow, MD as Consulting Physician (Ophthalmology)  Indicate any recent  Medical Services you may have received from other than [facility] providers in the past year (date may be approximate).     Assessment:   This is a routine wellness examination for [place].  Hearing/Vision screen No results found.  Dietary issues and exercise activities discussed:     Goals Addressed   None    Depression Screen    06/15/2023   12:52 PM 06/05/2022   12:34 PM 03/21/2022    1:02 PM 05/28/2021    1:52 PM 04/23/2020    1:12 PM 12/29/2019    8:50 AM 06/27/2019    3:32 PM  PHQ 2/9 Scores  PHQ - 2 Score 0 0 0 0 0 0 0    Fall Risk    06/15/2023   12:52 PM 10/22/2022    1:02 PM 06/05/2022   12:33 PM 05/12/2022    1:43 PM 03/21/2022    1:02 PM  Fall Risk   Falls in the past year? 1 1 1 1 1   Number falls in past yr: 0 0 0 0 0  Injury with Fall? 0 0 0 0 0  Risk for fall due to : History of fall(s);Impaired balance/gait No Fall Risks  History of fall(s) No Fall Risks  Follow up Falls evaluation completed Falls evaluation completed  Falls evaluation completed;Education provided;Falls prevention discussed Falls evaluation completed    MEDICARE RISK AT HOME:   TIMED UP AND GO:  Was the test performed?  No    Cognitive Function:    12/25/2017    8:48 AM 09/11/2016    9:28 AM 08/06/2015    2:07 PM  MMSE - Mini Mental State Exam  Orientation to time 4 3 5   Orientation to Place 4 4 3   Registration 3 3 3   Attention/ Calculation 5 5  5  Recall 0 0 0  Language- name 2 objects 2 2 2   Language- repeat 1 1 1   Language- follow 3 step command 3 3 3   Language- read & follow direction 1 1 1   Write a sentence 1 0 1  Copy design 0 0 0  Total score 24 22 24         06/15/2023   12:55 PM 06/05/2022   12:34 PM 05/28/2021    1:53 PM 04/23/2020    1:12 PM 04/19/2019    9:18 AM  6CIT Screen  What Year? 4 points 0 points 0 points 4 points 0 points  What month? 0 points 0 points 3 points 0 points 0 points  What time? 0 points 3 points 0 points 0 points 0 points  Count back from 20 0 points  0 points 4 points 4 points 0 points  Months in reverse 4 points 4 points 4 points 4 points 4 points  Repeat phrase 10 points 6 points 10 points 10 points 10 points  Total Score 18 points 13 points 21 points 22 points 14 points    Immunizations Immunization History  Administered Date(s) Administered   Covid-19, Mrna,Vaccine(Spikevax)43yrs and older 10/05/2022   Fluad Quad(high Dose 65+) 11/22/2020, 09/06/2021, 10/22/2022   Influenza, High Dose Seasonal PF 12/25/2017, 08/19/2018   Influenza,inj,Quad PF,6+ Mos 10/17/2013, 12/07/2015, 09/01/2016   Influenza-Unspecified 08/25/2008, 09/12/2011, 09/27/2012   PFIZER(Purple Top)SARS-COV-2 Vaccination 12/27/2019, 01/17/2020   Pneumococcal Conjugate-13 08/04/2014   Pneumococcal Polysaccharide-23 03/21/2022   Pneumococcal-Unspecified 05/07/2011   Respiratory Syncytial Virus Vaccine,Recomb Aduvanted(Arexvy) 10/05/2022   Tdap 05/07/2011   Zoster Recombinant(Shingrix) 10/05/2022, 12/09/2022    TDAP status: Due, Education has been provided regarding the importance of this vaccine. Advised may receive this vaccine at local pharmacy or Health Dept. Aware to provide a copy of the vaccination record if obtained from local pharmacy or Health Dept. Verbalized acceptance and understanding.  Flu Vaccine status: Up to date  Pneumococcal vaccine status: Up to date  Covid-19 vaccine status: Completed vaccines  Qualifies for Shingles Vaccine? Yes   Zostavax completed No   Shingrix Completed?: Yes  Screening Tests Health Maintenance  Topic Date Due   DTaP/Tdap/Td (2 - Td or Tdap) 05/06/2021   INFLUENZA VACCINE  07/09/2023   Medicare Annual Wellness (AWV)  06/14/2024   Pneumonia Vaccine 39+ Years old  Completed   DEXA SCAN  Completed   COVID-19 Vaccine  Completed   Hepatitis C Screening  Completed   Zoster Vaccines- Shingrix  Completed   HPV VACCINES  Aged Out   Fecal DNA (Cologuard)  Discontinued    Health Maintenance  Health Maintenance Due   Topic Date Due   DTaP/Tdap/Td (2 - Td or Tdap) 05/06/2021    Colorectal cancer screening: No longer required.   Mammogram status: Ordered 06/15/2023. Pt provided with contact info and advised to call to schedule appt.   Bone Density status: Completed 04/28/2022 . Results reflect: Bone density results: OSTEOPOROSIS. Repeat every 2 years.  Lung Cancer Screening: (Low Dose CT Chest recommended if Age 59-80 years, 20 pack-year currently smoking OR have quit w/in 15years.) does not qualify.   Lung Cancer Screening Referral: No   Additional Screening:  Hepatitis C Screening: does not qualify; Completed Yes   Vision Screening: Recommended annual ophthalmology exams for early detection of glaucoma and other disorders of the eye. Is the patient up to date with their annual eye exam?  Yes  Who is the provider or what is the name of the  office in which the patient attends annual eye exams? Dr.Hecker  If pt is not established with a provider, would they like to be referred to a provider to establish care? No .   Dental Screening: Recommended annual dental exams for proper oral hygiene  Diabetic Foot Exam: Diabetic Foot Exam: Completed N/A   Community Resource Referral / Chronic Care Management: CRR required this visit?  No   CCM required this visit?  No     Plan:     I have personally reviewed and noted the following in the patient's chart:   Medical and social history Use of alcohol, tobacco or illicit drugs  Current medications and supplements including opioid prescriptions. Patient is not currently taking opioid prescriptions. Functional ability and status Nutritional status Physical activity Advanced directives List of other physicians Hospitalizations, surgeries, and ER visits in previous 12 months Vitals Screenings to include cognitive, depression, and falls Referrals and appointments  In addition, I have reviewed and discussed with patient certain preventive protocols,  quality metrics, and best practice recommendations. A written personalized care plan for preventive services as well as general preventive health recommendations were provided to patient.     Caesar Bookman, NP   06/15/2023   After Visit Summary: (MyChart) Due to this being a telephonic visit, the after visit summary with patients personalized plan was offered to patient via MyChart   Nurse Notes: Advised to get Tdap vaccine at the pharmacy  Spent 18 minutes of face to face with patient  >50% time spent counseling; reviewing medical record; AWV and developing future plan of care.  I connected with  Joette Catching on 06/15/23 by a video enabled telemedicine application and verified that I am speaking with the correct person using two identifiers.   I discussed the limitations of evaluation and management by telemedicine. The patient expressed understanding and agreed to proceed.

## 2023-06-15 NOTE — Patient Instructions (Addendum)
This service is provided via telemedicine  No vital signs collected/recorded due to the encounter was a telemedicine visit.   Location of patient (ex: home, work):  Home  Patient consents to a telephone visit:  yes  Location of the provider (ex: office, home):  office  Name of any referring provider:  Ngetich, Dinah C, NP  Names of all persons participating in the telemedicine service and their role in the encounter:  Prabhleen Montemayor Cma, Patient and Ngetich, Dinah C, NP   Time spent on call:  10 mins   I connected with  [name] on 06/15/23 by a video enabled telemedicine application and verified that I am speaking with the correct person using two identifiers.   I discussed the limitations of evaluation and management by telemedicine. The patient expressed understanding and agreed to proceed.  [name] , Thank you for taking time to come for your Medicare Wellness Visit. I appreciate your ongoing commitment to your health goals. Please review the following plan we discussed and let me know if I can assist you in the future.   Screening recommendations/referrals: Colonoscopy : N/A  Mammogram : Ordered  Bone Density : Up to date  Recommended yearly ophthalmology/optometry visit for glaucoma screening and checkup Recommended yearly dental visit for hygiene and checkup  Vaccinations: Influenza vaccine- due annually in September/October Pneumococcal vaccine  : Up to date  Tdap vaccine : Due please get vaccine at the pharmacy  Shingles vaccine : Up to date   Advanced directives: Yes   Conditions/risks identified: advanced age (>29men, >48 women);hypertension;obesity (BMI >30kg/m2);smoking/ tobacco exposure  Next appointment: 1 year    Preventive Care 80 Years and Older, Female Preventive care refers to lifestyle choices and visits with your health care provider that can promote health and wellness. What does preventive care include? A yearly physical exam. This is also called  an annual well check. Dental exams once or twice a year. Routine eye exams. Ask your health care provider how often you should have your eyes checked. Personal lifestyle choices, including: Daily care of your teeth and gums. Regular physical activity. Eating a healthy diet. Avoiding tobacco and drug use. Limiting alcohol use. Practicing safe sex. Taking low-dose aspirin every day. Taking vitamin and mineral supplements as recommended by your health care provider. What happens during an annual well check? The services and screenings done by your health care provider during your annual well check will depend on your age, overall health, lifestyle risk factors, and family history of disease. Counseling  Your health care provider may ask you questions about your: Alcohol use. Tobacco use. Drug use. Emotional well-being. Home and relationship well-being. Sexual activity. Eating habits. History of falls. Memory and ability to understand (cognition). Work and work Astronomer. Reproductive health. Screening  You may have the following tests or measurements: Height, weight, and BMI. Blood pressure. Lipid and cholesterol levels. These may be checked every 5 years, or more frequently if you are over 35 years old. Skin check. Lung cancer screening. You may have this screening every year starting at age 63 if you have a 30-pack-year history of smoking and currently smoke or have quit within the past 15 years. Fecal occult blood test (FOBT) of the stool. You may have this test every year starting at age 19. Flexible sigmoidoscopy or colonoscopy. You may have a sigmoidoscopy every 5 years or a colonoscopy every 10 years starting at age 69. Hepatitis C blood test. Hepatitis B blood test. Sexually transmitted disease (STD)  testing. Diabetes screening. This is done by checking your blood sugar (glucose) after you have not eaten for a while (fasting). You may have this done every 1-3  years. Bone density scan. This is done to screen for osteoporosis. You may have this done starting at age 69. Mammogram. This may be done every 1-2 years. Talk to your health care provider about how often you should have regular mammograms. Talk with your health care provider about your test results, treatment options, and if necessary, the need for more tests. Vaccines  Your health care provider may recommend certain vaccines, such as: Influenza vaccine. This is recommended every year. Tetanus, diphtheria, and acellular pertussis (Tdap, Td) vaccine. You may need a Td booster every 10 years. Zoster vaccine. You may need this after age 69. Pneumococcal 13-valent conjugate (PCV13) vaccine. One dose is recommended after age 20. Pneumococcal polysaccharide (PPSV23) vaccine. One dose is recommended after age 43. Talk to your health care provider about which screenings and vaccines you need and how often you need them. This information is not intended to replace advice given to you by your health care provider. Make sure you discuss any questions you have with your health care provider. Document Released: 12/21/2015 Document Revised: 08/13/2016 Document Reviewed: 09/25/2015 Elsevier Interactive Patient Education  2017 [employer].  Fall Prevention in the Home Falls can cause injuries. They can happen to people of all ages. There are many things you can do to make your home safe and to help prevent falls. What can I do on the outside of my home? Regularly fix the edges of walkways and driveways and fix any cracks. Remove anything that might make you trip as you walk through a door, such as a raised step or threshold. Trim any bushes or trees on the path to your home. Use bright outdoor lighting. Clear any walking paths of anything that might make someone trip, such as rocks or tools. Regularly check to see if handrails are loose or broken. Make sure that both sides of any steps have  handrails. Any raised decks and porches should have guardrails on the edges. Have any leaves, snow, or ice cleared regularly. Use sand or salt on walking paths during winter. Clean up any spills in your garage right away. This includes oil or grease spills. What can I do in the bathroom? Use night lights. Install grab bars by the toilet and in the tub and shower. Do not use towel bars as grab bars. Use non-skid mats or decals in the tub or shower. If you need to sit down in the shower, use a plastic, non-slip stool. Keep the floor dry. Clean up any water that spills on the floor as soon as it happens. Remove soap buildup in the tub or shower regularly. Attach bath mats securely with double-sided non-slip rug tape. Do not have throw rugs and other things on the floor that can make you trip. What can I do in the bedroom? Use night lights. Make sure that you have a light by your bed that is easy to reach. Do not use any sheets or blankets that are too big for your bed. They should not hang down onto the floor. Have a firm chair that has side arms. You can use this for support while you get dressed. Do not have throw rugs and other things on the floor that can make you trip. What can I do in the kitchen? Clean up any spills right away. Avoid walking on wet  floors. Keep items that you use a lot in easy-to-reach places. If you need to reach something above you, use a strong step stool that has a grab bar. Keep electrical cords out of the way. Do not use floor polish or wax that makes floors slippery. If you must use wax, use non-skid floor wax. Do not have throw rugs and other things on the floor that can make you trip. What can I do with my stairs? Do not leave any items on the stairs. Make sure that there are handrails on both sides of the stairs and use them. Fix handrails that are broken or loose. Make sure that handrails are as long as the stairways. Check any carpeting to make sure  that it is firmly attached to the stairs. Fix any carpet that is loose or worn. Avoid having throw rugs at the top or bottom of the stairs. If you do have throw rugs, attach them to the floor with carpet tape. Make sure that you have a light switch at the top of the stairs and the bottom of the stairs. If you do not have them, ask someone to add them for you. What else can I do to help prevent falls? Wear shoes that: Do not have high heels. Have rubber bottoms. Are comfortable and fit you well. Are closed at the toe. Do not wear sandals. If you use a stepladder: Make sure that it is fully opened. Do not climb a closed stepladder. Make sure that both sides of the stepladder are locked into place. Ask someone to hold it for you, if possible. Clearly mark and make sure that you can see: Any grab bars or handrails. First and last steps. Where the edge of each step is. Use tools that help you move around (mobility aids) if they are needed. These include: Canes. Walkers. Scooters. Crutches. Turn on the lights when you go into a dark area. Replace any light bulbs as soon as they burn out. Set up your furniture so you have a clear path. Avoid moving your furniture around. If any of your floors are uneven, fix them. If there are any pets around you, be aware of where they are. Review your medicines with your doctor. Some medicines can make you feel dizzy. This can increase your chance of falling. Ask your doctor what other things that you can do to help prevent falls. This information is not intended to replace advice given to you by your health care provider. Make sure you discuss any questions you have with your health care provider. Document Released: 09/20/2009 Document Revised: 05/01/2016 Document Reviewed: 12/29/2014 Elsevier Interactive Patient Education  2017 [employer].

## 2023-06-19 ENCOUNTER — Ambulatory Visit (INDEPENDENT_AMBULATORY_CARE_PROVIDER_SITE_OTHER): Payer: Medicare HMO | Admitting: Family

## 2023-06-19 ENCOUNTER — Encounter: Payer: Self-pay | Admitting: Family

## 2023-06-19 VITALS — BP 140/88 | HR 59 | Temp 96.9°F | Resp 18 | Ht 64.0 in | Wt 247.0 lb

## 2023-06-19 DIAGNOSIS — N183 Chronic kidney disease, stage 3 unspecified: Secondary | ICD-10-CM

## 2023-06-19 DIAGNOSIS — I129 Hypertensive chronic kidney disease with stage 1 through stage 4 chronic kidney disease, or unspecified chronic kidney disease: Secondary | ICD-10-CM

## 2023-06-19 NOTE — Progress Notes (Unsigned)
Provider: Richarda Blade FNP-C  Erin Uecker, Donalee Citrin, NP  Patient Care Team: Lawanda Holzheimer, Donalee Citrin, NP as PCP - General (Family Medicine) Nahser, Deloris Ping, MD as PCP - Cardiology (Cardiology) Mateo Flow, MD as Consulting Physician (Ophthalmology)  Extended Emergency Contact Information Primary Emergency Contact: Langham,[name] Address: [address] [place] [address] [place] Home Phone: [phone] Mobile Phone: [phone] Relation: Daughter  Code Status:  Full Code  Goals of care: Advanced Directive information    06/15/2023   12:52 PM  Advanced Directives  Does Patient Have a Medical Advance Directive? Yes  Type of Advance Directive Healthcare Power of Attorney  Does patient want to make changes to medical advance directive? No - Patient declined  Copy of Healthcare Power of Attorney in Chart? No - copy requested     No chief complaint on file.   HPI:  Pt is a 77 y.o. female seen today for an acute visit for 2 weeks follow up for high blood pressure. She was here 05/26/2023 B/p was elevated 159/86 was advised to increase losartan from 75 to 100 mg tablet daily.  States one week ago readings were lower 139/80   Past Medical History:  Diagnosis Date   Cataract    slow growing    Chronic kidney disease    Hyperlipidemia    Hypertension    Insomnia, unspecified    Memory loss    mild dementia per daughter    Osteopenia    Other abnormal blood chemistry    Other specified cardiac dysrhythmias(427.89)    Other specified disease of nail    Stroke (HCC)    2006 or 2007 per daughter    Syncope and collapse    Unspecified disorder of kidney and ureter    Unspecified late effects of cerebrovascular disease    Unspecified transient cerebral ischemia    Unspecified urinary incontinence    Unspecified vitamin D deficiency    Vascular dementia, uncomplicated (HCC)    Past Surgical History:  Procedure Laterality Date   CATARACT  EXTRACTION Left 09/2022   CATARACT EXTRACTION Right 11/2021   NO PAST SURGERIES      No Known Allergies  Outpatient Encounter Medications as of 06/19/2023  Medication Sig   alendronate (FOSAMAX) 70 MG tablet TAKE 1 TABLET EVERY SEVEN DAYS. TAKE WITH A FULL GLASS OF WATER ON AN EMPTY STOMACH.   apixaban (ELIQUIS) 5 MG TABS tablet TAKE 1 TABLET TWICE DAILY   Calcium Carb-Cholecalciferol (CALCIUM 600+D3 PO) Take 1 tablet by mouth 2 (two) times daily.   cholecalciferol (VITAMIN D) 1000 units tablet Take 1,000 Units by mouth daily.   furosemide (LASIX) 20 MG tablet TAKE 1 TABLET EVERY DAY   furosemide (LASIX) 20 MG tablet As needed patient may take 20 MG Lasix PRN by mouth QD x 4 days as directed per [employer] HF Study PRN plan. DO NOT REMOVE   hydrALAZINE (APRESOLINE) 25 MG tablet TAKE 1 TABLET THREE TIMES DAILY WITH MEALS TO CONTROL BLOOD PRESSURE   losartan (COZAAR) 100 MG tablet Take 1 tablet (100 mg total) by mouth daily.   Memantine HCl-Donepezil HCl (NAMZARIC) 28-10 MG CP24 TAKE 1 CAPSULE  DAILY TO PRESERVE MEMORY   metoprolol tartrate (LOPRESSOR) 25 MG tablet TAKE 1/2 TABLET TWICE DAILY   Multiple Vitamin (MULTIVITAMIN) tablet Take 1 tablet by mouth daily.   oxybutynin (DITROPAN) 5 MG tablet TAKE 1 TABLET ONE TIME DAILY FOR BLADDER CONTROL  potassium chloride SA (KLOR-CON M) 20 MEQ tablet TAKE 1 TABLET EVERY DAY   potassium chloride SA (KLOR-CON M) 20 MEQ tablet As needed patient may take 20 mEq Potassium PRN by mouth QD x 4 days as directed per [employer] HF Study PRN plan. DO NOT REMOVE ORDER   simvastatin (ZOCOR) 40 MG tablet TAKE 1 TABLET EVERY DAY AT 6PM   Facility-Administered Encounter Medications as of 06/19/2023  Medication   lidocaine-EPINEPHrine (XYLOCAINE W/EPI) 1 %-1:100000 (with pres) injection 10 mL    Review of Systems  Immunization History  Administered Date(s) Administered   Covid-19, Mrna,Vaccine(Spikevax)35yrs and older 10/05/2022   Fluad  Quad(high Dose 65+) 11/22/2020, 09/06/2021, 10/22/2022   Influenza, High Dose Seasonal PF 12/25/2017, 08/19/2018   Influenza,inj,Quad PF,6+ Mos 10/17/2013, 12/07/2015, 09/01/2016   Influenza-Unspecified 08/25/2008, 09/12/2011, 09/27/2012   PFIZER(Purple Top)SARS-COV-2 Vaccination 12/27/2019, 01/17/2020   Pneumococcal Conjugate-13 08/04/2014   Pneumococcal Polysaccharide-23 03/21/2022   Pneumococcal-Unspecified 05/07/2011   Respiratory Syncytial Virus Vaccine,Recomb Aduvanted(Arexvy) 10/05/2022   Tdap 05/07/2011   Zoster Recombinant(Shingrix) 10/05/2022, 12/09/2022   Pertinent  Health Maintenance Due  Topic Date Due   INFLUENZA VACCINE  07/09/2023   DEXA SCAN  Completed      03/21/2022    1:02 PM 05/12/2022    1:43 PM 06/05/2022   12:33 PM 10/22/2022    1:02 PM 06/15/2023   12:52 PM  Fall Risk  Falls in the past year? 1 1 1 1 1   Was there an injury with Fall? 0 0 0 0 0  Fall Risk Category Calculator 1 1 1 1 1   Fall Risk Category (Retired) Low Low Low Low   (RETIRED) Patient Fall Risk Level Low fall risk Low fall risk  Low fall risk   Patient at Risk for Falls Due to No Fall Risks History of fall(s)  No Fall Risks History of fall(s);Impaired balance/gait  Fall risk Follow up Falls evaluation completed Falls evaluation completed;Education provided;Falls prevention discussed  Falls evaluation completed Falls evaluation completed   Functional Status Survey:    Vitals:   06/19/23 1524  Weight: 247 lb (112 kg)  Height: 5\' 4"  (1.626 m)   Body mass index is 42.4 kg/m. Physical Exam  Labs reviewed: Recent Labs    10/20/22 1015 01/23/23 1324 05/22/23 1008  NA 144 143 142  K 4.0 4.2 3.9  CL 106 104 104  CO2 28 24 28   GLUCOSE 97 94 106*  BUN 15 13 16   CREATININE 1.22* 1.26* 1.31*  CALCIUM 9.3 9.3 9.5   Recent Labs    10/20/22 1015 05/22/23 1008  AST 15 14  ALT 12 11  BILITOT 0.6 0.6  PROT 6.7 6.9   Recent Labs    10/20/22 1015 01/23/23 1324 05/22/23 1008  WBC  6.9 7.5 8.0  NEUTROABS 3,209  --  2,720  HGB 14.2 14.1 13.6  HCT 43.2 43.7 41.9  MCV 88.9 88 88.4  PLT 257 237 229   Lab Results  Component Value Date   TSH 2.02 05/22/2023   Lab Results  Component Value Date   HGBA1C 5.4 05/02/2021   Lab Results  Component Value Date   CHOL 156 05/22/2023   HDL 69 05/22/2023   LDLCALC 71 05/22/2023   TRIG 76 05/22/2023   CHOLHDL 2.3 05/22/2023    Significant Diagnostic Results in last 30 days:  No results found.  Assessment/Plan There are no diagnoses linked to this encounter.   Family/ staff Communication: Reviewed plan of care with patient  Labs/tests ordered:  None   Next Appointment:   Caesar Bookman, NP

## 2023-07-20 ENCOUNTER — Encounter: Payer: Self-pay | Admitting: Podiatrist

## 2023-07-20 ENCOUNTER — Ambulatory Visit: Payer: Medicare HMO | Admitting: Podiatrist

## 2023-07-20 DIAGNOSIS — B351 Tinea unguium: Secondary | ICD-10-CM

## 2023-07-20 DIAGNOSIS — M79675 Pain in left toe(s): Secondary | ICD-10-CM | POA: Diagnosis not present

## 2023-07-20 DIAGNOSIS — M79674 Pain in right toe(s): Secondary | ICD-10-CM | POA: Diagnosis not present

## 2023-07-20 MED ORDER — CICLOPIROX 8 % EX SOLN
Freq: Every day | CUTANEOUS | 3 refills | Status: DC
Start: 2023-07-20 — End: 2024-06-30

## 2023-07-20 NOTE — Progress Notes (Unsigned)
Chief Complaint  Patient presents with   Nail Problem    Patient is here for toe nail fungus and nail clipping     HPI: Patient is 77 y.o. female who presents today for painful, thickened toenails of both feet.  Notably her bilateral hallux nails are thick and painful with direct pressure.  She presents today utilizing a wheelchair for assistance and is here with her caregiver. .   Patient Active Problem List   Diagnosis Date Noted   Secondary hypercoagulable state (HCC) 11/13/2021   AF (paroxysmal atrial fibrillation) (HCC) 11/08/2021   SOB (shortness of breath) 11/08/2021   CKD (chronic kidney disease) stage 3, GFR 30-59 ml/min (HCC) 11/08/2021   Urinary incontinence 11/08/2021   Benign hypertension with CKD (chronic kidney disease) stage III (HCC) 09/06/2021   Late effects of cerebrovascular disease    BMI 40.0-44.9, adult (HCC) 06/27/2019   Urge urinary incontinence 03/03/2016   Hyperglycemia 03/03/2016   Vascular dementia, uncomplicated (HCC)    Hyperlipidemia    Chronic kidney disease     Current Outpatient Medications on File Prior to Visit  Medication Sig Dispense Refill   alendronate (FOSAMAX) 70 MG tablet TAKE 1 TABLET EVERY SEVEN DAYS. TAKE WITH A FULL GLASS OF WATER ON AN EMPTY STOMACH. 12 tablet 3   apixaban (ELIQUIS) 5 MG TABS tablet TAKE 1 TABLET TWICE DAILY 180 tablet 1   Calcium Carb-Cholecalciferol (CALCIUM 600+D3 PO) Take 1 tablet by mouth 2 (two) times daily.     cholecalciferol (VITAMIN D) 1000 units tablet Take 1,000 Units by mouth daily.     furosemide (LASIX) 20 MG tablet TAKE 1 TABLET EVERY DAY 90 tablet 3   furosemide (LASIX) 20 MG tablet As needed patient may take 20 MG Lasix PRN by mouth QD x 4 days as directed per [employer] HF Study PRN plan. DO NOT REMOVE 90 tablet 3   hydrALAZINE (APRESOLINE) 25 MG tablet TAKE 1 TABLET THREE TIMES DAILY WITH MEALS TO CONTROL BLOOD PRESSURE 270 tablet 1   losartan (COZAAR) 100 MG tablet Take 1 tablet (100 mg  total) by mouth daily. 90 tablet 1   Memantine HCl-Donepezil HCl (NAMZARIC) 28-10 MG CP24 TAKE 1 CAPSULE  DAILY TO PRESERVE MEMORY 90 capsule 3   metoprolol tartrate (LOPRESSOR) 25 MG tablet TAKE 1/2 TABLET TWICE DAILY 90 tablet 1   Multiple Vitamin (MULTIVITAMIN) tablet Take 1 tablet by mouth daily.     oxybutynin (DITROPAN) 5 MG tablet TAKE 1 TABLET ONE TIME DAILY FOR BLADDER CONTROL 90 tablet 3   potassium chloride SA (KLOR-CON M) 20 MEQ tablet TAKE 1 TABLET EVERY DAY 90 tablet 3   potassium chloride SA (KLOR-CON M) 20 MEQ tablet As needed patient may take 20 mEq Potassium PRN by mouth QD x 4 days as directed per [employer] HF Study PRN plan. DO NOT REMOVE ORDER 90 tablet 3   simvastatin (ZOCOR) 40 MG tablet TAKE 1 TABLET EVERY DAY AT 6PM 90 tablet 1   Current Facility-Administered Medications on File Prior to Visit  Medication Dose Route Frequency Provider Last Rate Last Admin   lidocaine-EPINEPHrine (XYLOCAINE W/EPI) 1 %-1:100000 (with pres) injection 10 mL  10 mL Infiltration Once Croitoru, Mihai, MD        No Known Allergies  Review of Systems No fevers, chills, nausea, muscle aches, no difficulty breathing, no calf pain, no chest pain or shortness of breath.   Physical Exam  GENERAL APPEARANCE: Alert, conversant. Appropriately groomed. No acute distress.   VASCULAR:  Pedal pulses left non palpable DP and faintly palpable left PT ; right foot faintly palpable dp and palpable PT pulse.   Capillary refill time is immediate to all digits,  Proximal to distal cooling is warm to warm.  Sparse hair growth is noted bilateral. No edema noted.   NEUROLOGIC: sensation is intact to 5.07 monofilament at 5/5 sites bilateral.  Light touch is intact bilateral, vibratory sensation intact bilateral  MUSCULOSKELETAL: acceptable muscle strength, tone and stability bilateral.  No gross boney pedal deformities noted.  No pain, crepitus or limitation noted with foot and ankle range of motion  bilateral.   DERMATOLOGIC: skin is warm, supple, and dry.  Color, texture, and turgor of skin within normal limits.  No open wounds are noted.  No preulcerative lesions are seen.  Digital nails are thick, discolored, dystrophic, brittle with subungual debris present and clinically mycotic x 10. Bilateral hallux nails are the most symptomatic.        Assessment    ICD-10-CM   1. Pain due to onychomycosis of toenails of both feet  B35.1    M79.675    M79.674        Plan  Discussed exam findings with the patient.  Discussed treatment options for onychomycosis including oral, laser and topical therapies.  She would like to try a topical and penlac is called in.  Also recommended routine nail debridement.  This was carried out today with sterile nail nippers and a power burr without complication.  Periodic routine nail debridement recommended every 3 months or as needed for follow-up.

## 2023-07-21 ENCOUNTER — Encounter: Payer: Self-pay | Admitting: Podiatrist

## 2023-07-28 DIAGNOSIS — Z006 Encounter for examination for normal comparison and control in clinical research program: Secondary | ICD-10-CM

## 2023-07-28 NOTE — Research (Cosign Needed Addendum)
Alleviate HF Research Study  18 Month f/u  No adverse events or cardiovascular medication changes to report at this time.   EQ 5D 5L completed  Current Outpatient Medications:    alendronate (FOSAMAX) 70 MG tablet, TAKE 1 TABLET EVERY SEVEN DAYS. TAKE WITH A FULL GLASS OF WATER ON AN EMPTY STOMACH., Disp: 12 tablet, Rfl: 3   apixaban (ELIQUIS) 5 MG TABS tablet, TAKE 1 TABLET TWICE DAILY, Disp: 180 tablet, Rfl: 1   Calcium Carb-Cholecalciferol (CALCIUM 600+D3 PO), Take 1 tablet by mouth 2 (two) times daily., Disp: , Rfl:    cholecalciferol (VITAMIN D) 1000 units tablet, Take 1,000 Units by mouth daily., Disp: , Rfl:    ciclopirox (PENLAC) 8 % solution, Apply topically daily. Appy daily for 7 days then remove with alcohol or nail polish remover.  Repeat process., Disp: 6.6 mL, Rfl: 3   furosemide (LASIX) 20 MG tablet, TAKE 1 TABLET EVERY DAY, Disp: 90 tablet, Rfl: 3   furosemide (LASIX) 20 MG tablet, As needed patient may take 20 MG Lasix PRN by mouth QD x 4 days as directed per [employer] HF Study PRN plan. DO NOT REMOVE, Disp: 90 tablet, Rfl: 3   hydrALAZINE (APRESOLINE) 25 MG tablet, TAKE 1 TABLET THREE TIMES DAILY WITH MEALS TO CONTROL BLOOD PRESSURE, Disp: 270 tablet, Rfl: 1   losartan (COZAAR) 100 MG tablet, Take 1 tablet (100 mg total) by mouth daily., Disp: 90 tablet, Rfl: 1   Memantine HCl-Donepezil HCl (NAMZARIC) 28-10 MG CP24, TAKE 1 CAPSULE  DAILY TO PRESERVE MEMORY, Disp: 90 capsule, Rfl: 3   metoprolol tartrate (LOPRESSOR) 25 MG tablet, TAKE 1/2 TABLET TWICE DAILY, Disp: 90 tablet, Rfl: 1   Multiple Vitamin (MULTIVITAMIN) tablet, Take 1 tablet by mouth daily., Disp: , Rfl:    oxybutynin (DITROPAN) 5 MG tablet, TAKE 1 TABLET ONE TIME DAILY FOR BLADDER CONTROL, Disp: 90 tablet, Rfl: 3   potassium chloride SA (KLOR-CON M) 20 MEQ tablet, TAKE 1 TABLET EVERY DAY, Disp: 90 tablet, Rfl: 3   potassium chloride SA (KLOR-CON M) 20 MEQ tablet, As needed patient may take 20 mEq  Potassium PRN by mouth QD x 4 days as directed per [employer] HF Study PRN plan. DO NOT REMOVE ORDER, Disp: 90 tablet, Rfl: 3   simvastatin (ZOCOR) 40 MG tablet, TAKE 1 TABLET EVERY DAY AT 6PM, Disp: 90 tablet, Rfl: 1  Current Facility-Administered Medications:    lidocaine-EPINEPHrine (XYLOCAINE W/EPI) 1 %-1:100000 (with pres) injection 10 mL, 10 mL, Infiltration, Once, Croitoru, Mihai, MD

## 2023-08-10 ENCOUNTER — Other Ambulatory Visit: Payer: Self-pay | Admitting: Family

## 2023-08-26 ENCOUNTER — Emergency Department (HOSPITAL_COMMUNITY): Payer: Medicare HMO

## 2023-08-26 ENCOUNTER — Emergency Department (HOSPITAL_COMMUNITY)
Admission: EM | Admit: 2023-08-26 | Discharge: 2023-08-26 | Disposition: A | Discharge status: Home / Self Care | Payer: Medicare HMO | Attending: Emergency Medicine | Admitting: Emergency Medicine

## 2023-08-26 DIAGNOSIS — M25531 Pain in right wrist: Secondary | ICD-10-CM | POA: Insufficient documentation

## 2023-08-26 DIAGNOSIS — Z7901 Long term (current) use of anticoagulants: Secondary | ICD-10-CM | POA: Insufficient documentation

## 2023-08-26 DIAGNOSIS — Z79899 Other long term (current) drug therapy: Secondary | ICD-10-CM | POA: Insufficient documentation

## 2023-08-26 DIAGNOSIS — I129 Hypertensive chronic kidney disease with stage 1 through stage 4 chronic kidney disease, or unspecified chronic kidney disease: Secondary | ICD-10-CM | POA: Insufficient documentation

## 2023-08-26 DIAGNOSIS — M79643 Pain in unspecified hand: Secondary | ICD-10-CM | POA: Diagnosis not present

## 2023-08-26 DIAGNOSIS — I4891 Unspecified atrial fibrillation: Secondary | ICD-10-CM | POA: Insufficient documentation

## 2023-08-26 DIAGNOSIS — S0990XA Unspecified injury of head, initial encounter: Secondary | ICD-10-CM | POA: Diagnosis not present

## 2023-08-26 DIAGNOSIS — R220 Localized swelling, mass and lump, head: Secondary | ICD-10-CM | POA: Diagnosis not present

## 2023-08-26 DIAGNOSIS — F0390 Unspecified dementia without behavioral disturbance: Secondary | ICD-10-CM | POA: Diagnosis not present

## 2023-08-26 DIAGNOSIS — W19XXXA Unspecified fall, initial encounter: Secondary | ICD-10-CM | POA: Insufficient documentation

## 2023-08-26 DIAGNOSIS — M19031 Primary osteoarthritis, right wrist: Secondary | ICD-10-CM | POA: Diagnosis not present

## 2023-08-26 DIAGNOSIS — S0083XA Contusion of other part of head, initial encounter: Secondary | ICD-10-CM | POA: Insufficient documentation

## 2023-08-26 DIAGNOSIS — R0902 Hypoxemia: Secondary | ICD-10-CM | POA: Diagnosis not present

## 2023-08-26 DIAGNOSIS — D330 Benign neoplasm of brain, supratentorial: Secondary | ICD-10-CM

## 2023-08-26 DIAGNOSIS — D331 Benign neoplasm of brain, infratentorial: Secondary | ICD-10-CM | POA: Diagnosis not present

## 2023-08-26 DIAGNOSIS — I499 Cardiac arrhythmia, unspecified: Secondary | ICD-10-CM | POA: Diagnosis not present

## 2023-08-26 DIAGNOSIS — N189 Chronic kidney disease, unspecified: Secondary | ICD-10-CM | POA: Insufficient documentation

## 2023-08-26 DIAGNOSIS — M8580 Other specified disorders of bone density and structure, unspecified site: Secondary | ICD-10-CM | POA: Diagnosis not present

## 2023-08-26 DIAGNOSIS — R55 Syncope and collapse: Secondary | ICD-10-CM | POA: Diagnosis not present

## 2023-08-26 DIAGNOSIS — M47812 Spondylosis without myelopathy or radiculopathy, cervical region: Secondary | ICD-10-CM | POA: Diagnosis not present

## 2023-08-26 DIAGNOSIS — S199XXA Unspecified injury of neck, initial encounter: Secondary | ICD-10-CM | POA: Diagnosis not present

## 2023-08-26 NOTE — ED Notes (Signed)
Trauma Response Nurse Documentation  [name] is a 77 y.o. female arriving to [facility] ED via EMS  On Eliquis (apixaban) daily. Trauma was activated as a Level 2 based on the following trauma criteria Elderly patients > 65 with head trauma on anti-coagulation (excluding ASA).  Patient cleared for CT by Dr. Renaye Rakers. Pt transported to CT with trauma response nurse present to monitor. RN remained with the patient throughout their absence from the department for clinical observation. GCS 14, baseline dementia per family.  History   Past Medical History:  Diagnosis Date   Cataract    slow growing    Chronic kidney disease    Hyperlipidemia    Hypertension    Insomnia, unspecified    Memory loss    mild dementia per daughter    Osteopenia    Other abnormal blood chemistry    Other specified cardiac dysrhythmias(427.89)    Other specified disease of nail    Stroke (HCC)    2006 or 2007 per daughter    Syncope and collapse    Unspecified disorder of kidney and ureter    Unspecified late effects of cerebrovascular disease    Unspecified transient cerebral ischemia    Unspecified urinary incontinence    Unspecified vitamin D deficiency    Vascular dementia, uncomplicated (HCC)      Past Surgical History:  Procedure Laterality Date   CATARACT EXTRACTION Left 09/2022   CATARACT EXTRACTION Right 11/2021   NO PAST SURGERIES       Initial Focused Assessment (If applicable, or please see trauma documentation): Patient oriented to baseline, dementia, PERR 3 Airway intact, bilateral breath sounds  CT's Completed:   CT Head and CT C-Spine   Interventions:  CT Head/Cspine  Plan for disposition:  Discharge home   Event Summary: Patient to ED after an unwitnessed fall, takes Eliquis for Afib. Unknown if patient hit her head. Imaging revealed no emergent findings. Patient was able to discharge home with family.  Bedside handoff with ED RN Hannie.    [name]  Trauma  Response RN  Please call TRN at [phone] for further assistance.

## 2023-08-26 NOTE — ED Notes (Signed)
Pt able to stand and walk with 1 person assist which is baseline. Tolerated well

## 2023-08-26 NOTE — Progress Notes (Signed)
Fall on Thinners.  Chaplain provided emotional and spiritual support.   Venida Jarvis, [place], [facility], Pager [phone]

## 2023-08-26 NOTE — Progress Notes (Signed)
Orthopedic Tech Progress Note Patient Details:  [name] Jan 21, 1946 [id number] Level 2 trauma Patient ID: [name], female   DOB: March 08, 1946, 77 y.o.   MRN: [id number]  Tonye Pearson 08/26/2023, 1:31 PM

## 2023-08-26 NOTE — ED Triage Notes (Signed)
Patient BIB [facility] from home for an unwitnessed fall on thinners. Daughter reported to EMS she heard the fall and found her mom unresponsive and not breathing right. Patient returned to baseline prior to EMS arrival and is currently at her baseline GCS 14. Patient only reporting right wrist and hip pain.

## 2023-08-26 NOTE — ED Notes (Signed)
Pt transported to CT ?

## 2023-08-26 NOTE — Discharge Instructions (Addendum)
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  This needs follow-up with an MRI with and without contrast as an outpatient.  Please contact your primary care doctor's office to arrange for this.

## 2023-08-26 NOTE — ED Provider Notes (Signed)
[place] EMERGENCY DEPARTMENT AT [facility] Provider Note   CSN: [id number] Arrival date & time: 08/26/23  1308     History  Chief Complaint  Patient presents with   Mike Craze AMYE KUPFERMAN is a 77 y.o. female with a history of dementia, on Eliquis, presenting from home with reported mechanical fall.  EMS reports that the patient's daughter heard a crash when she was upstairs and came upstairs and found the patient on the ground.  The patient is complaining of some right wrist pain.  She had a contusion to the forehead.  The patient reports that she lost her footing and fell.  History of paroxysmal A-fib for which she is on Eliquis.  Also has a history of chronic kidney disease, hypertension, hyperlipidemia.  HPI     Home Medications Prior to Admission medications   Medication Sig Start Date End Date Taking? Authorizing Provider  alendronate (FOSAMAX) 70 MG tablet TAKE 1 TABLET EVERY SEVEN DAYS. TAKE WITH A FULL GLASS OF WATER ON AN EMPTY STOMACH. 04/08/23   Ngetich, Dinah C, NP  apixaban (ELIQUIS) 5 MG TABS tablet TAKE 1 TABLET TWICE DAILY 03/05/23   Croitoru, Mihai, MD  Calcium Carb-Cholecalciferol (CALCIUM 600+D3 PO) Take 1 tablet by mouth 2 (two) times daily.    [provider]  cholecalciferol (VITAMIN D) 1000 units tablet Take 1,000 Units by mouth daily.    [provider]  ciclopirox (PENLAC) 8 % solution Apply topically daily. Appy daily for 7 days then remove with alcohol or nail polish remover.  Repeat process. 07/20/23   Delories Heinz, DPM  furosemide (LASIX) 20 MG tablet TAKE 1 TABLET EVERY DAY 11/07/22   Ngetich, Dinah C, NP  furosemide (LASIX) 20 MG tablet As needed patient may take 20 MG Lasix PRN by mouth QD x 4 days as directed per [employer] HF Study PRN plan. DO NOT REMOVE 01/23/23 01/24/24  Croitoru, Rachelle Hora, MD  hydrALAZINE (APRESOLINE) 25 MG tablet TAKE 1 TABLET THREE TIMES DAILY WITH MEALS TO CONTROL BLOOD PRESSURE 03/04/23    Ngetich, Dinah C, NP  losartan (COZAAR) 100 MG tablet Take 1 tablet (100 mg total) by mouth daily. 05/26/23   Ngetich, Dinah C, NP  metoprolol tartrate (LOPRESSOR) 25 MG tablet TAKE 1/2 TABLET TWICE DAILY 03/04/23   Ngetich, Dinah C, NP  Multiple Vitamin (MULTIVITAMIN) tablet Take 1 tablet by mouth daily.    [provider]  NAMZARIC 28-10 MG CP24 TAKE 1 CAPSULE DAILY TO PRESERVE MEMORY 08/11/23   Ngetich, Dinah C, NP  oxybutynin (DITROPAN) 5 MG tablet TAKE 1 TABLET ONE TIME DAILY FOR BLADDER CONTROL 04/08/23   Ngetich, Dinah C, NP  potassium chloride SA (KLOR-CON M) 20 MEQ tablet TAKE 1 TABLET EVERY DAY 11/07/22   Ngetich, Dinah C, NP  potassium chloride SA (KLOR-CON M) 20 MEQ tablet As needed patient may take 20 mEq Potassium PRN by mouth QD x 4 days as directed per [employer] HF Study PRN plan. DO NOT REMOVE ORDER 01/23/23 01/24/24  Croitoru, Rachelle Hora, MD  simvastatin (ZOCOR) 40 MG tablet TAKE 1 TABLET EVERY DAY AT [facility] 06/09/23   Ngetich, Donalee Citrin, NP      Allergies    Patient has no known allergies.    Review of Systems   Review of Systems  Physical Exam Updated Vital Signs BP (!) 153/59   Pulse 66   Temp (!) 96.9 F (36.1 C) (Oral)   Resp 17   Ht 5'  3" (1.6 m)   Wt 108.9 kg   SpO2 99%   BMI 42.51 kg/m  Physical Exam Constitutional:      General: She is not in acute distress.    Appearance: She is obese.  HENT:     Head: Normocephalic.     Comments: Small forehead contusion Eyes:     Conjunctiva/sclera: Conjunctivae normal.     Pupils: Pupils are equal, round, and reactive to light.  Neck:     Comments: No cervical midline tenderness, no spinal midline tenderness Cardiovascular:     Rate and Rhythm: Normal rate and regular rhythm.  Pulmonary:     Effort: Pulmonary effort is normal. No respiratory distress.  Abdominal:     General: There is no distension.     Tenderness: There is no abdominal tenderness.  Musculoskeletal:     Comments: Tenderness of the right  wrist with range of motion testing, mild No pain or tenderness with range testing of the hips No chest wall or abdominal tenderness  Skin:    General: Skin is warm and dry.  Neurological:     General: No focal deficit present.     Mental Status: She is alert. Mental status is at baseline.     ED Results / Procedures / Treatments   Labs (all labs ordered are listed, but only abnormal results are displayed) Labs Reviewed - No data to display  EKG None  Radiology DG Wrist Complete Right  Result Date: 08/26/2023 CLINICAL DATA:  Right wrist pain. EXAM: RIGHT WRIST - COMPLETE 3+ VIEW COMPARISON:  None Available. FINDINGS: There is diffuse osteopenia of the visualized osseous structures. No acute fracture or dislocation. No aggressive osseous lesion. There are mild diffuse degenerative changes of imaged joints. No radiopaque foreign bodies. Soft tissues are within normal limits. IMPRESSION: Negative. Electronically Signed   By: Jules Schick M.D.   On: 08/26/2023 14:31   CT Cervical Spine Wo Contrast  Result Date: 08/26/2023 CLINICAL DATA:  Neck trauma.  Unwitnessed fall. EXAM: CT CERVICAL SPINE WITHOUT CONTRAST TECHNIQUE: Multidetector CT imaging of the cervical spine was performed without intravenous contrast. Multiplanar CT image reconstructions were also generated. RADIATION DOSE REDUCTION: This exam was performed according to the departmental dose-optimization program which includes automated exposure control, adjustment of the mA and/or kV according to patient size and/or use of iterative reconstruction technique. COMPARISON:  Cervical spine radiographs 11/22/2020. FINDINGS: Alignment: No significant listhesis is present. Normal cervical lordosis is present. Skull base and vertebrae: Craniocervical junction is within normal limits. The vertebral body heights are normal. No acute fractures are present. Soft tissues and spinal canal: No prevertebral fluid or swelling. No visible canal  hematoma. Disc levels: Mild degenerative changes are present without focal stenosis. Upper chest: The lung apices are clear. The thoracic inlet is within normal limits. IMPRESSION: 1. No acute fracture or traumatic subluxation. 2. Mild degenerative changes of the cervical spine without focal stenosis. Electronically Signed   By: Marin Roberts M.D.   On: 08/26/2023 14:10   CT Head Wo Contrast  Result Date: 08/26/2023 CLINICAL DATA:  Head trauma. Unwitnessed fall. Patient was found unresponsive with abnormal breathing. EXAM: CT HEAD WITHOUT CONTRAST TECHNIQUE: Contiguous axial images were obtained from the base of the skull through the vertex without intravenous contrast. RADIATION DOSE REDUCTION: This exam was performed according to the departmental dose-optimization program which includes automated exposure control, adjustment of the mA and/or kV according to patient size and/or use of iterative reconstruction technique. COMPARISON:  CT  head without contrast 08/18/2009 FINDINGS: Brain: The right choroid plexus is asymmetrically enlarged and hyperdense measuring 16 x 15 x 18 mm. No layering blood products are present. Asymmetry of the right choroid was previously present. Advanced periventricular white matter hypoattenuation has progressed since the prior exam. No significant extra-axial fluid collection is present. The ventricles are of normal size. The brainstem and cerebellum are within normal limits. Midline structures are within normal limits. Vascular: No hyperdense vessel or unexpected calcification. Skull: Supraorbital soft tissue swelling is present. No underlying fracture is present. No other significant extracranial soft tissue abnormality is present. Sinuses/Orbits: The paranasal sinuses and mastoid air cells are clear. Bilateral lens replacements are noted. Globes and orbits are otherwise unremarkable. IMPRESSION: 1. Asymmetric enlargement and hyperdensity of the right choroid plexus. This is  concerning for a mass lesion, likely benign given relatively slow growth over 14 years. 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 Recommend non emergent MRI of the brain without and with contrast for further evaluation. 2. Supraorbital soft tissue swelling without underlying fracture. 3. Advanced periventricular white matter hypoattenuation has progressed since the prior exam. This likely reflects the sequela of chronic microvascular ischemia. Electronically Signed   By: Marin Roberts M.D.   On: 08/26/2023 14:04    Procedures Procedures    Medications Ordered in ED Medications - No data to display  ED Course/ Medical Decision Making/ A&P Clinical Course as of 08/26/23 1500  Wed Aug 26, 2023  1440 Patient's daughter present at the bedside and updated regarding incidental CT finding and need for outpatient MRI to monitor this intracranial mass.  She verbalized understanding. [MT]    Clinical Course User Index [MT] Jaremy Nosal, Kermit Balo, MD                                 Medical Decision Making Amount and/or Complexity of Data Reviewed Radiology: ordered.   Patient is presenting with a suspected mechanical fall, on Eliquis.  Supplement history provided by the patient's family and EMS.  CT imaging and xray was ordered at patient's arrival and personally viewed interpreted, notable for potential choroid plexus papilloma.  No emergent findings.  Per my discussion with the radiologist appears consistent with intracranial bleed or hemorrhage.  Patient appears pleasantly demented on exam but appears to be at her baseline level.  Supplemental history is provided by the patient's daughter at the bedside.  The patient was in stable condition, plan for ambulation trial and then discharge.          Final Clinical Impression(s) / ED Diagnoses Final diagnoses:  Fall, initial encounter  Choroid plexus papilloma (HCC)    Rx / DC Orders ED Discharge Orders     None          Camara Rosander, Kermit Balo, MD 08/26/23 1500

## 2023-09-03 ENCOUNTER — Ambulatory Visit
Admission: RE | Admit: 2023-09-03 | Discharge: 2023-09-03 | Disposition: A | Discharge status: Home / Self Care | Payer: Medicare HMO | Source: Ambulatory Visit | Attending: Family | Admitting: Family

## 2023-09-03 ENCOUNTER — Encounter: Payer: Self-pay | Admitting: Family

## 2023-09-03 ENCOUNTER — Ambulatory Visit (INDEPENDENT_AMBULATORY_CARE_PROVIDER_SITE_OTHER): Payer: Medicare HMO | Admitting: Family

## 2023-09-03 VITALS — BP 130/80 | HR 82 | Temp 97.6°F | Resp 16 | Ht 63.0 in | Wt 247.0 lb

## 2023-09-03 DIAGNOSIS — R0781 Pleurodynia: Secondary | ICD-10-CM | POA: Diagnosis not present

## 2023-09-03 DIAGNOSIS — M25531 Pain in right wrist: Secondary | ICD-10-CM | POA: Diagnosis not present

## 2023-09-03 NOTE — Progress Notes (Signed)
Provider: Richarda Blade FNP-C  Kanishk Stroebel, Donalee Citrin, NP  Patient Care Team: Felicha Frayne, Donalee Citrin, NP as PCP - General (Family Medicine) Nahser, Deloris Ping, MD as PCP - Cardiology (Cardiology) Mateo Flow, MD as Consulting Physician (Ophthalmology)  Extended Emergency Contact Information Primary Emergency Contact: Zeek,[name] Address: [address], [place] [address] [place] Home Phone: [phone] Mobile Phone: [phone] Relation: Daughter  Code Status:  Full Code  Goals of care: Advanced Directive information    09/03/2023    2:42 PM  Advanced Directives  Does Patient Have a Medical Advance Directive? No     Chief Complaint  Patient presents with   Hospitalization Follow-up    Larey Seat and hand hurts, bruised    HPI:  Pt is a 77 y.o. female seen today for an acute visit for transition of care post ED visit on 08/26/2023 post fall episode at home.Daughter states had a crash while she was upstairs and came down found patient on the ground.patient reported losing her footing and fell.she complained of right wrist pain.Also had a contusion to the forehead.patient on Eliquis for Hx of stroke. EMS was called and was taken to ED. CT head scan was consistent with intracranial bleed or Hemorrhage. No acute fracture or traumatic subluxation.  Mild degenerative changes of the cervical spine without focal stenosis.Right wrist X-ray was negative for fracture or dislocation. She is here with her daughter at this visit. States right wrist swelling and pain has improved.  No redness. She complains of right rib cage pain worse with deep breathing or cough.  She denies any shortness of breath or chest tightness.  Past Medical History:  Diagnosis Date   Cataract    slow growing    Chronic kidney disease    Hyperlipidemia    Hypertension    Insomnia, unspecified    Memory loss    mild dementia per daughter    Osteopenia    Other abnormal blood chemistry     Other specified cardiac dysrhythmias(427.89)    Other specified disease of nail    Stroke (HCC)    2006 or 2007 per daughter    Syncope and collapse    Unspecified disorder of kidney and ureter    Unspecified late effects of cerebrovascular disease    Unspecified transient cerebral ischemia    Unspecified urinary incontinence    Unspecified vitamin D deficiency    Vascular dementia, uncomplicated (HCC)    Past Surgical History:  Procedure Laterality Date   CATARACT EXTRACTION Left 09/2022   CATARACT EXTRACTION Right 11/2021   NO PAST SURGERIES      No Known Allergies  Outpatient Encounter Medications as of 09/03/2023  Medication Sig   alendronate (FOSAMAX) 70 MG tablet TAKE 1 TABLET EVERY SEVEN DAYS. TAKE WITH A FULL GLASS OF WATER ON AN EMPTY STOMACH.   apixaban (ELIQUIS) 5 MG TABS tablet TAKE 1 TABLET TWICE DAILY   Calcium Carb-Cholecalciferol (CALCIUM 600+D3 PO) Take 1 tablet by mouth 2 (two) times daily.   cholecalciferol (VITAMIN D) 1000 units tablet Take 1,000 Units by mouth daily.   ciclopirox (PENLAC) 8 % solution Apply topically daily. Appy daily for 7 days then remove with alcohol or nail polish remover.  Repeat process.   furosemide (LASIX) 20 MG tablet TAKE 1 TABLET EVERY DAY   furosemide (LASIX) 20 MG tablet As needed patient may take 20 MG Lasix PRN by mouth QD x 4 days as directed per  [employer] HF Study PRN plan. DO NOT REMOVE   hydrALAZINE (APRESOLINE) 25 MG tablet TAKE 1 TABLET THREE TIMES DAILY WITH MEALS TO CONTROL BLOOD PRESSURE   losartan (COZAAR) 100 MG tablet Take 1 tablet (100 mg total) by mouth daily.   metoprolol tartrate (LOPRESSOR) 25 MG tablet TAKE 1/2 TABLET TWICE DAILY   Multiple Vitamin (MULTIVITAMIN) tablet Take 1 tablet by mouth daily.   NAMZARIC 28-10 MG CP24 TAKE 1 CAPSULE DAILY TO PRESERVE MEMORY   oxybutynin (DITROPAN) 5 MG tablet TAKE 1 TABLET ONE TIME DAILY FOR BLADDER CONTROL   potassium chloride SA (KLOR-CON M) 20 MEQ tablet  TAKE 1 TABLET EVERY DAY   potassium chloride SA (KLOR-CON M) 20 MEQ tablet As needed patient may take 20 mEq Potassium PRN by mouth QD x 4 days as directed per [employer] HF Study PRN plan. DO NOT REMOVE ORDER   simvastatin (ZOCOR) 40 MG tablet TAKE 1 TABLET EVERY DAY AT 6PM   Facility-Administered Encounter Medications as of 09/03/2023  Medication   lidocaine-EPINEPHrine (XYLOCAINE W/EPI) 1 %-1:100000 (with pres) injection 10 mL    Review of Systems  Constitutional:  Negative for appetite change, chills, fatigue, fever and unexpected weight change.  HENT:  Negative for congestion, dental problem, ear discharge, ear pain, facial swelling, hearing loss, nosebleeds, postnasal drip, rhinorrhea, sinus pressure, sinus pain, sneezing, sore throat, tinnitus and trouble swallowing.   Eyes:  Negative for pain, discharge, redness, itching and visual disturbance.  Respiratory:  Negative for cough, chest tightness, shortness of breath and wheezing.   Cardiovascular:  Positive for chest pain. Negative for palpitations and leg swelling.       Right rib cage pain   Gastrointestinal:  Negative for abdominal distention, abdominal pain, blood in stool, constipation, diarrhea, nausea and vomiting.  Endocrine: Negative for cold intolerance, heat intolerance, polydipsia, polyphagia and polyuria.  Genitourinary:  Negative for difficulty urinating, dysuria, flank pain, frequency and urgency.  Musculoskeletal:  Positive for gait problem. Negative for arthralgias, back pain, joint swelling, myalgias, neck pain and neck stiffness.  Skin:  Negative for color change, pallor, rash and wound.  Neurological:  Negative for dizziness, syncope, speech difficulty, weakness, light-headedness, numbness and headaches.  Hematological:  Does not bruise/bleed easily.  Psychiatric/Behavioral:  Negative for agitation, behavioral problems, confusion, hallucinations, self-injury, sleep disturbance and suicidal ideas. The patient  is not nervous/anxious.     Immunization History  Administered Date(s) Administered   Fluad Quad(high Dose 65+) 11/22/2020, 09/06/2021, 10/22/2022   Influenza, High Dose Seasonal PF 12/25/2017, 08/19/2018   Influenza,inj,Quad PF,6+ Mos 10/17/2013, 12/07/2015, 09/01/2016   Influenza-Unspecified 08/25/2008, 09/12/2011, 09/27/2012   Moderna Covid-19 Fall Seasonal Vaccine 18yrs & older 10/05/2022   PFIZER(Purple Top)SARS-COV-2 Vaccination 12/27/2019, 01/17/2020   Pneumococcal Conjugate-13 08/04/2014   Pneumococcal Polysaccharide-23 03/21/2022   Pneumococcal-Unspecified 05/07/2011   Respiratory Syncytial Virus Vaccine,Recomb Aduvanted(Arexvy) 10/05/2022   Tdap 05/07/2011   Zoster Recombinant(Shingrix) 10/05/2022, 12/09/2022   Pertinent  Health Maintenance Due  Topic Date Due   INFLUENZA VACCINE  07/09/2023   DEXA SCAN  Completed      05/12/2022    1:43 PM 06/05/2022   12:33 PM 10/22/2022    1:02 PM 06/15/2023   12:52 PM 09/03/2023    2:42 PM  Fall Risk  Falls in the past year? 1 1 1 1 1   Was there an injury with Fall? 0 0 0 0 1  Fall Risk Category Calculator 1 1 1 1 2   Fall Risk Category (Retired) Low Low Low    (  RETIRED) Patient Fall Risk Level Low fall risk  Low fall risk    Patient at Risk for Falls Due to History of fall(s)  No Fall Risks History of fall(s);Impaired balance/gait   Fall risk Follow up Falls evaluation completed;Education provided;Falls prevention discussed  Falls evaluation completed Falls evaluation completed    Functional Status Survey:    Vitals:   09/03/23 1444  BP: 130/80  Pulse: 82  Resp: 16  Temp: 97.6 F (36.4 C)  SpO2: 98%  Weight: 247 lb (112 kg)  Height: 5\' 3"  (1.6 m)   Body mass index is 43.75 kg/m. Physical Exam Vitals reviewed.  Constitutional:      General: She is not in acute distress.    Appearance: Normal appearance. She is obese. She is not ill-appearing or diaphoretic.  HENT:     Head: Normocephalic.     Right Ear: There is  impacted cerumen.     Left Ear: There is impacted cerumen.     Nose: Nose normal. No congestion or rhinorrhea.     Mouth/Throat:     Mouth: Mucous membranes are moist.     Pharynx: Oropharynx is clear. No oropharyngeal exudate or posterior oropharyngeal erythema.  Eyes:     General: No scleral icterus.       Right eye: No discharge.        Left eye: No discharge.     Extraocular Movements: Extraocular movements intact.     Conjunctiva/sclera: Conjunctivae normal.     Pupils: Pupils are equal, round, and reactive to light.  Neck:     Vascular: No carotid bruit.  Cardiovascular:     Rate and Rhythm: Normal rate and regular rhythm.     Pulses: Normal pulses.     Heart sounds: Normal heart sounds. No murmur heard.    No friction rub. No gallop.  Pulmonary:     Effort: Pulmonary effort is normal. No respiratory distress.     Breath sounds: Normal breath sounds. No wheezing, rhonchi or rales.  Chest:     Chest wall: No tenderness.  Abdominal:     General: Bowel sounds are normal. There is no distension.     Palpations: Abdomen is soft. There is no mass.     Tenderness: There is no abdominal tenderness. There is no right CVA tenderness, left CVA tenderness, guarding or rebound.  Musculoskeletal:        General: No swelling. Normal range of motion.     Right wrist: Tenderness present. No swelling, deformity, effusion or crepitus. Normal range of motion. Normal pulse.     Left wrist: Normal.     Cervical back: Normal range of motion. No rigidity or tenderness.     Right lower leg: No edema.     Left lower leg: No edema.  Lymphadenopathy:     Cervical: No cervical adenopathy.  Skin:    General: Skin is warm and dry.     Coloration: Skin is not pale.     Findings: No bruising, erythema, lesion or rash.  Neurological:     Mental Status: She is alert and oriented to person, place, and time.     Cranial Nerves: No cranial nerve deficit.     Sensory: No sensory deficit.     Motor: No  weakness.     Coordination: Coordination normal.     Gait: Gait abnormal.  Psychiatric:        Mood and Affect: Mood normal.        Speech:  Speech normal.        Behavior: Behavior normal.     Labs reviewed: Recent Labs    10/20/22 1015 01/23/23 1324 05/22/23 1008  NA 144 143 142  K 4.0 4.2 3.9  CL 106 104 104  CO2 28 24 28   GLUCOSE 97 94 106*  BUN 15 13 16   CREATININE 1.22* 1.26* 1.31*  CALCIUM 9.3 9.3 9.5   Recent Labs    10/20/22 1015 05/22/23 1008  AST 15 14  ALT 12 11  BILITOT 0.6 0.6  PROT 6.7 6.9   Recent Labs    10/20/22 1015 01/23/23 1324 05/22/23 1008  WBC 6.9 7.5 8.0  NEUTROABS 3,209  --  2,720  HGB 14.2 14.1 13.6  HCT 43.2 43.7 41.9  MCV 88.9 88 88.4  PLT 257 237 229   Lab Results  Component Value Date   TSH 2.02 05/22/2023   Lab Results  Component Value Date   HGBA1C 5.4 05/02/2021   Lab Results  Component Value Date   CHOL 156 05/22/2023   HDL 69 05/22/2023   LDLCALC 71 05/22/2023   TRIG 76 05/22/2023   CHOLHDL 2.3 05/22/2023    Significant Diagnostic Results in last 30 days:  DG Wrist Complete Right  Result Date: 08/26/2023 CLINICAL DATA:  Right wrist pain. EXAM: RIGHT WRIST - COMPLETE 3+ VIEW COMPARISON:  None Available. FINDINGS: There is diffuse osteopenia of the visualized osseous structures. No acute fracture or dislocation. No aggressive osseous lesion. There are mild diffuse degenerative changes of imaged joints. No radiopaque foreign bodies. Soft tissues are within normal limits. IMPRESSION: Negative. Electronically Signed   By: Jules Schick M.D.   On: 08/26/2023 14:31   CT Cervical Spine Wo Contrast  Result Date: 08/26/2023 CLINICAL DATA:  Neck trauma.  Unwitnessed fall. EXAM: CT CERVICAL SPINE WITHOUT CONTRAST TECHNIQUE: Multidetector CT imaging of the cervical spine was performed without intravenous contrast. Multiplanar CT image reconstructions were also generated. RADIATION DOSE REDUCTION: This exam was performed  according to the departmental dose-optimization program which includes automated exposure control, adjustment of the mA and/or kV according to patient size and/or use of iterative reconstruction technique. COMPARISON:  Cervical spine radiographs 11/22/2020. FINDINGS: Alignment: No significant listhesis is present. Normal cervical lordosis is present. Skull base and vertebrae: Craniocervical junction is within normal limits. The vertebral body heights are normal. No acute fractures are present. Soft tissues and spinal canal: No prevertebral fluid or swelling. No visible canal hematoma. Disc levels: Mild degenerative changes are present without focal stenosis. Upper chest: The lung apices are clear. The thoracic inlet is within normal limits. IMPRESSION: 1. No acute fracture or traumatic subluxation. 2. Mild degenerative changes of the cervical spine without focal stenosis. Electronically Signed   By: Marin Roberts M.D.   On: 08/26/2023 14:10   CT Head Wo Contrast  Result Date: 08/26/2023 CLINICAL DATA:  Head trauma. Unwitnessed fall. Patient was found unresponsive with abnormal breathing. EXAM: CT HEAD WITHOUT CONTRAST TECHNIQUE: Contiguous axial images were obtained from the base of the skull through the vertex without intravenous contrast. RADIATION DOSE REDUCTION: This exam was performed according to the departmental dose-optimization program which includes automated exposure control, adjustment of the mA and/or kV according to patient size and/or use of iterative reconstruction technique. COMPARISON:  CT head without contrast 08/18/2009 FINDINGS: Brain: The right choroid plexus is asymmetrically enlarged and hyperdense measuring 16 x 15 x 18 mm. No layering blood products are present. Asymmetry of the right choroid was previously present.  Advanced periventricular white matter hypoattenuation has progressed since the prior exam. No significant extra-axial fluid collection is present. The ventricles are  of normal size. The brainstem and cerebellum are within normal limits. Midline structures are within normal limits. Vascular: No hyperdense vessel or unexpected calcification. Skull: Supraorbital soft tissue swelling is present. No underlying fracture is present. No other significant extracranial soft tissue abnormality is present. Sinuses/Orbits: The paranasal sinuses and mastoid air cells are clear. Bilateral lens replacements are noted. Globes and orbits are otherwise unremarkable. IMPRESSION: 1. Asymmetric enlargement and hyperdensity of the right choroid plexus. This is concerning for a mass lesion, likely benign given relatively slow growth over 14 years. 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 Recommend non emergent MRI of the brain without and with contrast for further evaluation. 2. Supraorbital soft tissue swelling without underlying fracture. 3. Advanced periventricular white matter hypoattenuation has progressed since the prior exam. This likely reflects the sequela of chronic microvascular ischemia. Electronically Signed   By: Marin Roberts M.D.   On: 08/26/2023 14:04    Assessment/Plan  1. Right wrist pain Recent x-ray negative for fracture or dislocation. Swelling and pain has improved -Continue over-the-counter analgesic  2. Rib pain on right side Reports pain with deep breathing cough -Right rib cage tender to palpation. Will obtain imaging Bilateral lung clear to auscultation - DG Ribs Unilateral Right; Future  Family/ staff Communication: Reviewed plan of care with patient and daughter verbalized understanding  Labs/tests ordered: - DG Ribs Unilateral Right; Future  Next Appointment: Return if symptoms worsen or fail to improve.   Caesar Bookman, NP

## 2023-09-03 NOTE — Patient Instructions (Addendum)
-   Take Extra strength tylenol 500 mg tablet one by mouth every 8 hrs as needed for pain  - Notify provider if symptoms worsen - Please get right rib cage X-ray at [facility] imaging at [facility] then will call you with results.

## 2023-09-09 ENCOUNTER — Other Ambulatory Visit: Payer: Self-pay | Admitting: Family

## 2023-09-09 ENCOUNTER — Other Ambulatory Visit (HOSPITAL_COMMUNITY): Payer: Self-pay | Admitting: Cardiovascular Disease

## 2023-09-09 DIAGNOSIS — I129 Hypertensive chronic kidney disease with stage 1 through stage 4 chronic kidney disease, or unspecified chronic kidney disease: Secondary | ICD-10-CM

## 2023-09-09 DIAGNOSIS — I480 Paroxysmal atrial fibrillation: Secondary | ICD-10-CM

## 2023-09-09 DIAGNOSIS — Z006 Encounter for examination for normal comparison and control in clinical research program: Secondary | ICD-10-CM

## 2023-09-09 NOTE — Research (Signed)
error 

## 2023-09-10 NOTE — Telephone Encounter (Signed)
Eliquis 5mg  refill request received. Patient is 77 years old, weight-112kg, Crea- 1.31 on 05/22/23, Diagnosis-Afib, and last seen by Dr. Elease Hashimoto on 06/08/23. Dose is appropriate based on dosing criteria. Will send in refill to requested pharmacy.

## 2023-09-10 NOTE — Telephone Encounter (Signed)
Patient medication has allergy contraindications. Medication pend and sent to PCP Ngetich, Nelda Bucks, NP for approval.

## 2023-09-25 ENCOUNTER — Other Ambulatory Visit: Payer: Self-pay | Admitting: Family

## 2023-09-25 DIAGNOSIS — S2239XA Fracture of one rib, unspecified side, initial encounter for closed fracture: Secondary | ICD-10-CM

## 2023-10-20 ENCOUNTER — Ambulatory Visit: Payer: Medicare HMO | Admitting: Podiatry

## 2023-10-23 ENCOUNTER — Ambulatory Visit
Admission: RE | Admit: 2023-10-23 | Discharge: 2023-10-23 | Disposition: A | Discharge status: Home / Self Care | Payer: Medicare HMO | Source: Ambulatory Visit | Attending: Family | Admitting: Family

## 2023-10-23 DIAGNOSIS — J9811 Atelectasis: Secondary | ICD-10-CM | POA: Diagnosis not present

## 2023-10-23 DIAGNOSIS — S2241XD Multiple fractures of ribs, right side, subsequent encounter for fracture with routine healing: Secondary | ICD-10-CM | POA: Diagnosis not present

## 2023-10-23 DIAGNOSIS — S2239XA Fracture of one rib, unspecified side, initial encounter for closed fracture: Secondary | ICD-10-CM

## 2023-10-23 DIAGNOSIS — I2510 Atherosclerotic heart disease of native coronary artery without angina pectoris: Secondary | ICD-10-CM | POA: Diagnosis not present

## 2023-11-16 ENCOUNTER — Other Ambulatory Visit: Payer: Self-pay

## 2023-11-16 DIAGNOSIS — I480 Paroxysmal atrial fibrillation: Secondary | ICD-10-CM

## 2023-11-16 DIAGNOSIS — I129 Hypertensive chronic kidney disease with stage 1 through stage 4 chronic kidney disease, or unspecified chronic kidney disease: Secondary | ICD-10-CM

## 2023-11-16 DIAGNOSIS — E782 Mixed hyperlipidemia: Secondary | ICD-10-CM

## 2023-11-17 ENCOUNTER — Other Ambulatory Visit: Payer: Medicare HMO

## 2023-11-17 DIAGNOSIS — I129 Hypertensive chronic kidney disease with stage 1 through stage 4 chronic kidney disease, or unspecified chronic kidney disease: Secondary | ICD-10-CM | POA: Diagnosis not present

## 2023-11-17 DIAGNOSIS — N183 Chronic kidney disease, stage 3 unspecified: Secondary | ICD-10-CM | POA: Diagnosis not present

## 2023-11-17 DIAGNOSIS — H401231 Low-tension glaucoma, bilateral, mild stage: Secondary | ICD-10-CM | POA: Diagnosis not present

## 2023-11-17 DIAGNOSIS — I480 Paroxysmal atrial fibrillation: Secondary | ICD-10-CM | POA: Diagnosis not present

## 2023-11-17 DIAGNOSIS — E782 Mixed hyperlipidemia: Secondary | ICD-10-CM | POA: Diagnosis not present

## 2023-11-17 LAB — HM DIABETES EYE EXAM

## 2023-11-18 ENCOUNTER — Other Ambulatory Visit: Payer: Self-pay | Admitting: Family

## 2023-11-18 LAB — COMPLETE METABOLIC PANEL WITH GFR
AG Ratio: 1.3 (calc) (ref 1.0–2.5)
ALT: 15 U/L (ref 6–29)
AST: 18 U/L (ref 10–35)
Albumin: 3.8 g/dL (ref 3.6–5.1)
Alkaline phosphatase (APISO): 82 U/L (ref 37–153)
BUN/Creatinine Ratio: 16 (calc) (ref 6–22)
BUN: 23 mg/dL (ref 7–25)
CO2: 28 mmol/L (ref 20–32)
Calcium: 9.5 mg/dL (ref 8.6–10.4)
Chloride: 105 mmol/L (ref 98–110)
Creat: 1.46 mg/dL — ABNORMAL HIGH (ref 0.60–1.00)
Globulin: 2.9 g/dL (ref 1.9–3.7)
Glucose, Bld: 100 mg/dL — ABNORMAL HIGH (ref 65–99)
Potassium: 4.2 mmol/L (ref 3.5–5.3)
Sodium: 142 mmol/L (ref 135–146)
Total Bilirubin: 0.4 mg/dL (ref 0.2–1.2)
Total Protein: 6.7 g/dL (ref 6.1–8.1)
eGFR: 36 mL/min/{1.73_m2} — ABNORMAL LOW (ref >=60)

## 2023-11-18 LAB — CBC WITH DIFFERENTIAL/PLATELET
Absolute Lymphocytes: 3968 {cells}/uL — ABNORMAL HIGH (ref 850–3900)
Absolute Monocytes: 570 {cells}/uL (ref 200–950)
Basophils Absolute: 23 {cells}/uL (ref 0–200)
Basophils Relative: 0.3 %
Eosinophils Absolute: 90 {cells}/uL (ref 15–500)
Eosinophils Relative: 1.2 %
HCT: 41.5 % (ref 35.0–45.0)
Hemoglobin: 13.4 g/dL (ref 11.7–15.5)
MCH: 28.5 pg (ref 27.0–33.0)
MCHC: 32.3 g/dL (ref 32.0–36.0)
MCV: 88.3 fL (ref 80.0–100.0)
MPV: 10.6 fL (ref 7.5–12.5)
Monocytes Relative: 7.6 %
Neutro Abs: 2850 {cells}/uL (ref 1500–7800)
Neutrophils Relative %: 38 %
Platelets: 234 10*3/uL (ref 140–400)
RBC: 4.7 10*6/uL (ref 3.80–5.10)
RDW: 11.7 % (ref 11.0–15.0)
Total Lymphocyte: 52.9 %
WBC: 7.5 10*3/uL (ref 3.8–10.8)

## 2023-11-18 LAB — LIPID PANEL
Cholesterol: 159 mg/dL (ref <200)
HDL: 67 mg/dL (ref >=50)
LDL Cholesterol (Calc): 78 mg/dL
Non-HDL Cholesterol (Calc): 92 mg/dL (ref <130)
Total CHOL/HDL Ratio: 2.4 (calc) (ref <5.0)
Triglycerides: 62 mg/dL (ref <150)

## 2023-11-18 LAB — TSH: TSH: 1.48 m[IU]/L (ref 0.40–4.50)

## 2023-11-19 ENCOUNTER — Ambulatory Visit (INDEPENDENT_AMBULATORY_CARE_PROVIDER_SITE_OTHER): Payer: Medicare HMO | Admitting: Family

## 2023-11-19 VITALS — BP 132/84 | HR 67 | Temp 97.8°F | Resp 20 | Ht 63.0 in | Wt 245.2 lb

## 2023-11-19 DIAGNOSIS — I129 Hypertensive chronic kidney disease with stage 1 through stage 4 chronic kidney disease, or unspecified chronic kidney disease: Secondary | ICD-10-CM

## 2023-11-19 DIAGNOSIS — F0150 Vascular dementia without behavioral disturbance: Secondary | ICD-10-CM | POA: Diagnosis not present

## 2023-11-19 DIAGNOSIS — E782 Mixed hyperlipidemia: Secondary | ICD-10-CM

## 2023-11-19 DIAGNOSIS — Z23 Encounter for immunization: Secondary | ICD-10-CM | POA: Diagnosis not present

## 2023-11-19 DIAGNOSIS — I480 Paroxysmal atrial fibrillation: Secondary | ICD-10-CM

## 2023-11-19 DIAGNOSIS — N183 Chronic kidney disease, stage 3 unspecified: Secondary | ICD-10-CM

## 2023-11-19 NOTE — Progress Notes (Signed)
Provider: Richarda Blade FNP-C   Karmyn Lowman, Donalee Citrin, NP  Patient Care Team: Neil Brickell, Donalee Citrin, NP as PCP - General (Family Medicine) Nahser, Deloris Ping, MD as PCP - Cardiology (Cardiology) Mateo Flow, MD as Consulting Physician (Ophthalmology)  Extended Emergency Contact Information Primary Emergency Contact: Rideaux,[name] Address: [address] [place] [address] [place] Home Phone: [phone] Mobile Phone: [phone] Relation: Daughter  Code Status:  Full Code  Goals of care: Advanced Directive information    09/03/2023    2:42 PM  Advanced Directives  Does Patient Have a Medical Advance Directive? No     Chief Complaint  Patient presents with   Medical Management of Chronic Issues    Patient presents today for a 6 month follow-up   Quality Metric Gaps    TDAP, flu, COVID#4    HPI:  Pt is a 77 y.o. female seen today for 6 months follow up for medical management of chronic diseases.  Has medical history of essential hypertension with chronic kidney disease stage III, A-fib, hyperlipidemia, vascular dementia without behavioral disturbance among other conditions.  She is here with her daughter.she denies any acute issues.  She is due for Tdap, influenza and COVID-19 vaccine. Agrees to get influenza vaccine this visit.  Daughter made aware to take it to the pharmacy for Tdap and COVID-19 vaccine.  Past Medical History:  Diagnosis Date   Cataract    slow growing    Chronic kidney disease    Hyperlipidemia    Hypertension    Insomnia, unspecified    Memory loss    mild dementia per daughter    Osteopenia    Other abnormal blood chemistry    Other specified cardiac dysrhythmias(427.89)    Other specified disease of nail    Stroke (HCC)    2006 or 2007 per daughter    Syncope and collapse    Unspecified disorder of kidney and ureter    Unspecified late effects of cerebrovascular disease    Unspecified transient cerebral  ischemia    Unspecified urinary incontinence    Unspecified vitamin D deficiency    Vascular dementia, uncomplicated (HCC)    Past Surgical History:  Procedure Laterality Date   CATARACT EXTRACTION Left 09/2022   CATARACT EXTRACTION Right 11/2021   NO PAST SURGERIES      No Known Allergies  Allergies as of 11/19/2023   No Known Allergies      Medication List        Accurate as of November 19, 2023 11:29 AM. If you have any questions, ask your nurse or doctor.          alendronate 70 MG tablet Commonly known as: FOSAMAX TAKE 1 TABLET EVERY SEVEN DAYS. TAKE WITH A FULL GLASS OF WATER ON AN EMPTY STOMACH.   CALCIUM 600+D3 PO Take 1 tablet by mouth 2 (two) times daily.   cholecalciferol 1000 units tablet Commonly known as: VITAMIN D Take 1,000 Units by mouth daily.   ciclopirox 8 % solution Commonly known as: PENLAC Apply topically daily. Appy daily for 7 days then remove with alcohol or nail polish remover.  Repeat process.   Eliquis 5 MG Tabs tablet Generic drug: apixaban TAKE 1 TABLET TWICE DAILY   furosemide 20 MG tablet Commonly known as: LASIX TAKE 1 TABLET EVERY DAY   furosemide 20 MG tablet Commonly known as: LASIX As needed patient may take 20 MG Lasix PRN by mouth QD  x 4 days as directed per Alleviate Research HF Study PRN plan. DO NOT REMOVE   hydrALAZINE 25 MG tablet Commonly known as: APRESOLINE TAKE 1 TABLET THREE TIMES DAILY WITH MEALS TO CONTROL BLOOD PRESSURE   losartan 100 MG tablet Commonly known as: COZAAR Take 1 tablet (100 mg total) by mouth daily.   metoprolol tartrate 25 MG tablet Commonly known as: LOPRESSOR TAKE 1/2 TABLET TWICE DAILY   multivitamin tablet Take 1 tablet by mouth daily.   Namzaric 28-10 MG Cp24 Generic drug: Memantine HCl-Donepezil HCl TAKE 1 CAPSULE DAILY TO PRESERVE MEMORY   oxybutynin 5 MG tablet Commonly known as: DITROPAN TAKE 1 TABLET ONE TIME DAILY FOR BLADDER CONTROL   potassium chloride SA 20  MEQ tablet Commonly known as: KLOR-CON M TAKE 1 TABLET EVERY DAY   potassium chloride SA 20 MEQ tablet Commonly known as: KLOR-CON M As needed patient may take 20 mEq Potassium PRN by mouth QD x 4 days as directed per Alleviate Research HF Study PRN plan. DO NOT REMOVE ORDER   simvastatin 40 MG tablet Commonly known as: ZOCOR TAKE 1 TABLET EVERY DAY AT 6PM        Review of Systems  Constitutional:  Negative for appetite change, chills, fatigue, fever and unexpected weight change.  HENT:  Negative for congestion, dental problem, ear discharge, ear pain, facial swelling, hearing loss, nosebleeds, postnasal drip, rhinorrhea, sinus pressure, sinus pain, sneezing, sore throat, tinnitus and trouble swallowing.   Eyes:  Negative for pain, discharge, redness, itching and visual disturbance.  Respiratory:  Negative for cough, chest tightness, shortness of breath and wheezing.   Cardiovascular:  Negative for chest pain, palpitations and leg swelling.  Gastrointestinal:  Negative for abdominal distention, abdominal pain, blood in stool, constipation, diarrhea, nausea and vomiting.  Endocrine: Negative for cold intolerance, heat intolerance, polydipsia, polyphagia and polyuria.  Genitourinary:  Negative for difficulty urinating, dysuria, flank pain, frequency and urgency.  Musculoskeletal:  Positive for gait problem. Negative for arthralgias, back pain, joint swelling, myalgias, neck pain and neck stiffness.  Skin:  Negative for color change, pallor, rash and wound.  Neurological:  Positive for weakness. Negative for dizziness, syncope, speech difficulty, light-headedness, numbness and headaches.       Right-sided weakness  Hematological:  Does not bruise/bleed easily.  Psychiatric/Behavioral:  Negative for agitation, behavioral problems, confusion, hallucinations, self-injury, sleep disturbance and suicidal ideas. The patient is not nervous/anxious.     Immunization History  Administered  Date(s) Administered   Fluad Quad(high Dose 65+) 11/22/2020, 09/06/2021, 10/22/2022   Fluad Trivalent(High Dose 65+) 11/19/2023   Influenza, High Dose Seasonal PF 12/25/2017, 08/19/2018   Influenza,inj,Quad PF,6+ Mos 10/17/2013, 12/07/2015, 09/01/2016   Influenza-Unspecified 08/25/2008, 09/12/2011, 09/27/2012   Moderna Covid-19 Fall Seasonal Vaccine 10yrs & older 10/05/2022   PFIZER(Purple Top)SARS-COV-2 Vaccination 12/27/2019, 01/17/2020   Pneumococcal Conjugate-13 08/04/2014   Pneumococcal Polysaccharide-23 03/21/2022   Pneumococcal-Unspecified 05/07/2011   Respiratory Syncytial Virus Vaccine,Recomb Aduvanted(Arexvy) 10/05/2022   Tdap 05/07/2011   Zoster Recombinant(Shingrix) 10/05/2022, 12/09/2022   Pertinent  Health Maintenance Due  Topic Date Due   INFLUENZA VACCINE  Completed   DEXA SCAN  Completed      06/05/2022   12:33 PM 10/22/2022    1:02 PM 06/15/2023   12:52 PM 09/03/2023    2:42 PM 11/19/2023   11:10 AM  Fall Risk  Falls in the past year? 1 1 1 1  0  Was there an injury with Fall? 0 0 0 1 0  Fall Risk Category Calculator  1 1 1 2  0  Fall Risk Category (Retired) Low Low     (RETIRED) Patient Fall Risk Level  Low fall risk     Patient at Risk for Falls Due to  No Fall Risks History of fall(s);Impaired balance/gait  History of fall(s)  Fall risk Follow up  Falls evaluation completed Falls evaluation completed  Falls evaluation completed   Functional Status Survey:    Vitals:   11/19/23 1110  BP: 132/84  Pulse: 67  Resp: 20  Temp: 97.8 F (36.6 C)  SpO2: 99%  Weight: 245 lb 3.2 oz (111.2 kg)  Height: 5\' 3"  (1.6 m)   Body mass index is 43.44 kg/m. Physical Exam Vitals reviewed.  Constitutional:      General: She is not in acute distress.    Appearance: Normal appearance. She is obese. She is not ill-appearing or diaphoretic.  HENT:     Head: Normocephalic.     Right Ear: Tympanic membrane, ear canal and external ear normal. There is no impacted cerumen.      Left Ear: Tympanic membrane, ear canal and external ear normal. There is no impacted cerumen.     Nose: Nose normal. No congestion or rhinorrhea.     Mouth/Throat:     Mouth: Mucous membranes are moist.     Pharynx: Oropharynx is clear. No oropharyngeal exudate or posterior oropharyngeal erythema.  Eyes:     General: No scleral icterus.       Right eye: No discharge.        Left eye: No discharge.     Extraocular Movements: Extraocular movements intact.     Conjunctiva/sclera: Conjunctivae normal.     Pupils: Pupils are equal, round, and reactive to light.  Neck:     Vascular: No carotid bruit.  Cardiovascular:     Rate and Rhythm: Normal rate and regular rhythm.     Pulses: Normal pulses.     Heart sounds: Normal heart sounds. No murmur heard.    No friction rub. No gallop.  Pulmonary:     Effort: Pulmonary effort is normal. No respiratory distress.     Breath sounds: Normal breath sounds. No wheezing, rhonchi or rales.  Chest:     Chest wall: No tenderness.  Abdominal:     General: Bowel sounds are normal. There is no distension.     Palpations: Abdomen is soft. There is no mass.     Tenderness: There is no abdominal tenderness. There is no right CVA tenderness, left CVA tenderness, guarding or rebound.  Musculoskeletal:        General: No swelling or tenderness. Normal range of motion.     Cervical back: Normal range of motion. No rigidity or tenderness.     Right lower leg: No edema.     Left lower leg: No edema.     Comments: Right-sided weakness  Lymphadenopathy:     Cervical: No cervical adenopathy.  Skin:    General: Skin is warm and dry.     Coloration: Skin is not pale.     Findings: No bruising, erythema, lesion or rash.  Neurological:     Mental Status: She is alert and oriented to person, place, and time.     Cranial Nerves: No cranial nerve deficit.     Sensory: No sensory deficit.     Motor: No weakness.     Coordination: Coordination normal.      Gait: Gait abnormal.  Psychiatric:  Mood and Affect: Mood normal.        Speech: Speech normal.        Behavior: Behavior normal.        Thought Content: Thought content normal.        Judgment: Judgment normal.     Labs reviewed: Recent Labs    01/23/23 1324 05/22/23 1008 11/17/23 1020  NA 143 142 142  K 4.2 3.9 4.2  CL 104 104 105  CO2 24 28 28   GLUCOSE 94 106* 100*  BUN 13 16 23   CREATININE 1.26* 1.31* 1.46*  CALCIUM 9.3 9.5 9.5   Recent Labs    05/22/23 1008 11/17/23 1020  AST 14 18  ALT 11 15  BILITOT 0.6 0.4  PROT 6.9 6.7   Recent Labs    01/23/23 1324 05/22/23 1008 11/17/23 1020  WBC 7.5 8.0 7.5  NEUTROABS  --  2,720 2,850  HGB 14.1 13.6 13.4  HCT 43.7 41.9 41.5  MCV 88 88.4 88.3  PLT 237 229 234   Lab Results  Component Value Date   TSH 1.48 11/17/2023   Lab Results  Component Value Date   HGBA1C 5.4 05/02/2021   Lab Results  Component Value Date   CHOL 159 11/17/2023   HDL 67 11/17/2023   LDLCALC 78 11/17/2023   TRIG 62 11/17/2023   CHOLHDL 2.4 11/17/2023    Significant Diagnostic Results in last 30 days:  CT CHEST WO CONTRAST Result Date: 11/10/2023 CLINICAL DATA:  Closed traumatic minimally displaced fracture of 1 rib, right side. EXAM: CT CHEST WITHOUT CONTRAST TECHNIQUE: Multidetector CT imaging of the chest was performed following the standard protocol without IV contrast. RADIATION DOSE REDUCTION: This exam was performed according to the departmental dose-optimization program which includes automated exposure control, adjustment of the mA and/or kV according to patient size and/or use of iterative reconstruction technique. COMPARISON:  Rib radiographs 09/03/2023 FINDINGS: Cardiovascular: The heart is upper normal in size. Mitral annulus and coronary artery calcifications. Mild atherosclerosis and tortuosity of the thoracic aorta. No aortic aneurysm. Common origin of the brachiocephalic and left common carotid artery, variant arch  anatomy. No pericardial effusion. Mediastinum/Nodes: No mediastinal adenopathy. Hilar assessment is limited in the absence of IV contrast. Decompressed esophagus. Heterogeneous thyroid gland but no dominant thyroid nodule. Lungs/Pleura: Subsegmental bandlike atelectasis in the anterior right middle and lower lobes. Additional subsegmental linear atelectasis in both lower lobes. 1 Background heterogeneous pulmonary parenchyma. No pneumothorax, pleural effusion, or confluent airspace disease. The trachea and central airways are clear. No suspicious pulmonary nodule. Upper Abdomen: Acute findings. Abdominal aortic atherosclerosis. No free fluid. Musculoskeletal: There is subacute fractures of right ribs 5, 6, 7, 8, and 9. Fractures are nondisplaced with surrounding callus formation. Majority of these fractures were not demonstrated on prior radiograph. No left rib fractures. Thoracic spondylosis with diffuse spurring. Partial ankylosis of multiple facets. Left chest wall loop recorder. No chest wall contusion. IMPRESSION: 1. Subacute fractures of right ribs 5, 6, 7, 8, and 9. Fractures are nondisplaced with surrounding callus formation. Majority of these fractures were not demonstrated on prior radiograph. 2. Subsegmental atelectasis in the right middle and lower lobes. 3. Coronary artery calcifications. Aortic Atherosclerosis (ICD10-I70.0). Electronically Signed   By: Narda Rutherford M.D.   On: 11/10/2023 18:15    Assessment/Plan 1. Need for influenza vaccination Afebrile Flu shot administered by CMA no acute reaction reported.   - Flu Vaccine Trivalent High Dose (Fluad)  2. Benign hypertension with CKD (chronic kidney disease) stage  III (HCC) (Primary) Blood pressure well-controlled -Continue on losartan,Hydralazine, furosemide and metoprolol - TSH - COMPLETE METABOLIC PANEL WITH GFR - CBC with Differential/Platelet  3. AF (paroxysmal atrial fibrillation) (HCC) Heart rate controlled -Continue  metoprolol for rate control -Continue on apixaban for anticoagulation - COMPLETE METABOLIC PANEL WITH GFR - CBC with Differential/Platelet  4. Mixed hyperlipidemia LDL at goal -Continue on simvastatin - Lipid panel  5. Vascular dementia without behavioral disturbance (HCC) No new behavioral issues reported -Continue on [employer] staff Communication: Reviewed plan of care with patient and daughter verbalized understanding  Labs/tests ordered:  - TSH - COMPLETE METABOLIC PANEL WITH GFR - CBC with Differential/Platelet - Lipid panel  Next Appointment : Return in about 6 months (around 05/19/2024) for medical mangement of chronic issues., Fasting labs in 6 months prior to visit.   Caesar Bookman, NP

## 2023-11-20 ENCOUNTER — Ambulatory Visit: Payer: Medicare HMO | Admitting: Family

## 2023-11-25 ENCOUNTER — Encounter: Payer: Self-pay | Admitting: Family

## 2023-11-26 ENCOUNTER — Other Ambulatory Visit: Payer: Self-pay

## 2023-11-26 DIAGNOSIS — N1832 Chronic kidney disease, stage 3b: Secondary | ICD-10-CM

## 2023-12-15 ENCOUNTER — Other Ambulatory Visit (HOSPITAL_COMMUNITY): Payer: Self-pay

## 2023-12-17 ENCOUNTER — Other Ambulatory Visit (HOSPITAL_COMMUNITY): Payer: Self-pay

## 2023-12-17 MED FILL — Memantine HCl-Donepezil HCl Cap ER 24HR 28-10 MG: ORAL | 90 days supply | Qty: 90 | Fill #0 | Status: CN

## 2023-12-17 MED FILL — Alendronate Sodium Tab 70 MG: ORAL | 84 days supply | Qty: 12 | Fill #0 | Status: CN

## 2023-12-17 MED FILL — Oxybutynin Chloride Tab 5 MG: ORAL | 90 days supply | Qty: 90 | Fill #0 | Status: CN

## 2023-12-18 ENCOUNTER — Other Ambulatory Visit (HOSPITAL_COMMUNITY): Payer: Self-pay

## 2023-12-22 ENCOUNTER — Other Ambulatory Visit (HOSPITAL_COMMUNITY): Payer: Self-pay

## 2024-01-14 DIAGNOSIS — Z006 Encounter for examination for normal comparison and control in clinical research program: Secondary | ICD-10-CM

## 2024-01-14 NOTE — Research (Signed)
 Alleviate HF Research Study  24 Month  No adverse events or cardiovascular medication changes to report at this time.   Current Outpatient Medications:    alendronate  (FOSAMAX ) 70 MG tablet, Take 1 tablet (70 mg total) by mouth every 7 (seven) days. TAKE WITH A FULL GLASS OF WATER ON AN EMPTY STOMACH., Disp: 12 tablet, Rfl: 3   apixaban  (ELIQUIS ) 5 MG TABS tablet, TAKE 1 TABLET TWICE DAILY, Disp: 180 tablet, Rfl: 1   Calcium Carb-Cholecalciferol (CALCIUM 600+D3 PO), Take 1 tablet by mouth 2 (two) times daily., Disp: , Rfl:    cholecalciferol (VITAMIN D) 1000 units tablet, Take 1,000 Units by mouth daily., Disp: , Rfl:    ciclopirox  (PENLAC ) 8 % solution, Apply topically daily. Appy daily for 7 days then remove with alcohol or nail polish remover.  Repeat process., Disp: 6.6 mL, Rfl: 3   furosemide  (LASIX ) 20 MG tablet, Take 1 tablet (20 mg total) by mouth daily for 4 days as needed. Use as directed per alleviate research HF study as needed plan., Disp: 90 tablet, Rfl: 3   furosemide  (LASIX ) 20 MG tablet, As needed patient may take 20 MG Lasix  PRN by mouth QD x 4 days as directed per Alleviate Research HF Study PRN plan. DO NOT REMOVE, Disp: 90 tablet, Rfl: 3   hydrALAZINE  (APRESOLINE ) 25 MG tablet, TAKE 1 TABLET THREE TIMES DAILY WITH MEALS TO CONTROL BLOOD PRESSURE, Disp: 270 tablet, Rfl: 1   losartan  (COZAAR ) 100 MG tablet, Take 1 tablet (100 mg total) by mouth daily., Disp: 90 tablet, Rfl: 1   Memantine  HCl-Donepezil  HCl (NAMZARIC ) 28-10 MG CP24, Take 1 capsule by mouth daily to preserve memory., Disp: 90 capsule, Rfl: 3   metoprolol  tartrate (LOPRESSOR ) 25 MG tablet, TAKE 1/2 TABLET TWICE DAILY, Disp: 90 tablet, Rfl: 1   Multiple Vitamin (MULTIVITAMIN) tablet, Take 1 tablet by mouth daily., Disp: , Rfl:    oxybutynin  (DITROPAN ) 5 MG tablet, Take 1 tablet (5 mg total) by mouth daily for bladder control., Disp: 90 tablet, Rfl: 3   potassium chloride  SA (KLOR-CON  M) 20 MEQ tablet, Take 1 tablet  (20 mEq total) by mouth daily as needed for 4 days. Use as directed per alleviate research HF study, Disp: 90 tablet, Rfl: 3   potassium chloride  SA (KLOR-CON  M) 20 MEQ tablet, As needed patient may take 20 mEq Potassium PRN by mouth QD x 4 days as directed per Alleviate Research HF Study PRN plan. DO NOT REMOVE ORDER, Disp: 90 tablet, Rfl: 3   simvastatin  (ZOCOR ) 40 MG tablet, Take 1 tablet (40 mg total) by mouth daily at 6 PM., Disp: 90 tablet, Rfl: 3  Current Facility-Administered Medications:    lidocaine -EPINEPHrine  (XYLOCAINE  W/EPI) 1 %-1:100000 (with pres) injection 10 mL, 10 mL, Infiltration, Once, Croitoru, Mihai, MD

## 2024-01-15 ENCOUNTER — Encounter (HOSPITAL_COMMUNITY): Payer: Self-pay

## 2024-01-15 ENCOUNTER — Other Ambulatory Visit (HOSPITAL_COMMUNITY): Payer: Self-pay

## 2024-01-15 ENCOUNTER — Other Ambulatory Visit: Payer: Self-pay | Admitting: Family

## 2024-01-15 DIAGNOSIS — I10 Essential (primary) hypertension: Secondary | ICD-10-CM

## 2024-01-15 MED ORDER — METOPROLOL TARTRATE 25 MG PO TABS
12.5000 mg | ORAL_TABLET | Freq: Two times a day (BID) | ORAL | 5 refills | Status: AC
  Filled 2024-01-15 – 2024-01-25 (×2): qty 60, 60d supply, fill #0
  Filled 2024-05-01: qty 60, 60d supply, fill #1
  Filled 2024-07-04: qty 60, 60d supply, fill #2
  Filled 2024-09-14: qty 60, 60d supply, fill #3
  Filled 2024-11-06: qty 60, 60d supply, fill #4
  Filled 2025-01-10: qty 60, 60d supply, fill #5

## 2024-01-15 MED ORDER — HYDRALAZINE HCL 25 MG PO TABS
25.0000 mg | ORAL_TABLET | Freq: Three times a day (TID) | ORAL | 1 refills | Status: DC
  Filled 2024-01-15 – 2024-01-25 (×2): qty 270, 90d supply, fill #0
  Filled 2024-05-01: qty 270, 90d supply, fill #1

## 2024-01-15 MED ORDER — APIXABAN 5 MG PO TABS
5.0000 mg | ORAL_TABLET | Freq: Two times a day (BID) | ORAL | 5 refills | Status: DC
Start: 2024-01-15 — End: 2024-09-14
  Filled 2024-01-15 – 2024-01-25 (×2): qty 60, 30d supply, fill #0
  Filled 2024-02-22: qty 180, 90d supply, fill #1
  Filled 2024-07-17: qty 120, 60d supply, fill #2

## 2024-01-15 NOTE — Telephone Encounter (Signed)
 High risk warning populated for Eliquis , metoprolol , and hydralazine . Will send to provider to review    Furosemide  and Losartan  will need to be refused as there is a dose/instruction discrepancy

## 2024-01-18 ENCOUNTER — Other Ambulatory Visit (HOSPITAL_COMMUNITY): Payer: Self-pay

## 2024-01-19 ENCOUNTER — Other Ambulatory Visit (HOSPITAL_COMMUNITY): Payer: Self-pay

## 2024-01-19 ENCOUNTER — Other Ambulatory Visit: Payer: Self-pay

## 2024-01-19 MED ORDER — FUROSEMIDE 20 MG PO TABS
ORAL_TABLET | ORAL | 3 refills | Status: AC
  Filled 2024-01-19: qty 90, 90d supply, fill #0
  Filled 2024-09-25: qty 90, 90d supply, fill #1
  Filled 2024-12-23: qty 90, 90d supply, fill #2

## 2024-01-19 MED ORDER — POTASSIUM CHLORIDE CRYS ER 20 MEQ PO TBCR
EXTENDED_RELEASE_TABLET | ORAL | 3 refills | Status: AC
Start: 2024-01-14 — End: 2025-01-13
  Filled 2024-01-19: qty 90, 90d supply, fill #0

## 2024-01-19 NOTE — Addendum Note (Signed)
Addended by: Dutch Quint B on: 01/19/2024 09:44 AM   Modules accepted: Orders

## 2024-01-19 NOTE — Addendum Note (Signed)
Addended by: Dutch Quint B on: 01/19/2024 09:43 AM   Modules accepted: Orders

## 2024-01-21 ENCOUNTER — Other Ambulatory Visit (HOSPITAL_BASED_OUTPATIENT_CLINIC_OR_DEPARTMENT_OTHER): Payer: Self-pay

## 2024-01-24 MED FILL — Oxybutynin Chloride Tab 5 MG: ORAL | 90 days supply | Qty: 90 | Fill #0 | Status: AC

## 2024-01-25 ENCOUNTER — Other Ambulatory Visit: Payer: Self-pay | Admitting: Family

## 2024-01-25 ENCOUNTER — Ambulatory Visit: Payer: Medicare HMO | Admitting: Podiatry

## 2024-01-25 ENCOUNTER — Other Ambulatory Visit (HOSPITAL_COMMUNITY): Payer: Self-pay

## 2024-01-25 ENCOUNTER — Other Ambulatory Visit: Payer: Self-pay

## 2024-01-25 DIAGNOSIS — I10 Essential (primary) hypertension: Secondary | ICD-10-CM

## 2024-01-25 DIAGNOSIS — I129 Hypertensive chronic kidney disease with stage 1 through stage 4 chronic kidney disease, or unspecified chronic kidney disease: Secondary | ICD-10-CM

## 2024-01-25 MED ORDER — LOSARTAN POTASSIUM 100 MG PO TABS
100.0000 mg | ORAL_TABLET | Freq: Every day | ORAL | 1 refills | Status: DC
Start: 2024-01-25 — End: 2024-06-05
  Filled 2024-01-25 – 2024-01-29 (×2): qty 90, 90d supply, fill #0
  Filled 2024-05-01: qty 90, 90d supply, fill #1

## 2024-01-25 MED FILL — Alendronate Sodium Tab 70 MG: ORAL | 84 days supply | Qty: 12 | Fill #0 | Status: AC

## 2024-01-25 NOTE — Telephone Encounter (Signed)
 Pharmacy requested refill.  ?Pended Rx and sent to [facility] for approval.  ?

## 2024-01-26 ENCOUNTER — Other Ambulatory Visit (HOSPITAL_COMMUNITY): Payer: Self-pay

## 2024-01-26 ENCOUNTER — Encounter (HOSPITAL_COMMUNITY): Payer: Self-pay

## 2024-01-29 ENCOUNTER — Other Ambulatory Visit (HOSPITAL_COMMUNITY): Payer: Self-pay

## 2024-01-29 ENCOUNTER — Other Ambulatory Visit: Payer: Self-pay

## 2024-02-22 ENCOUNTER — Other Ambulatory Visit (HOSPITAL_COMMUNITY): Payer: Self-pay

## 2024-02-22 MED FILL — Memantine HCl-Donepezil HCl Cap ER 24HR 28-10 MG: ORAL | 30 days supply | Qty: 30 | Fill #0 | Status: AC

## 2024-02-22 MED FILL — Simvastatin Tab 40 MG: ORAL | 90 days supply | Qty: 90 | Fill #0 | Status: AC

## 2024-02-23 ENCOUNTER — Other Ambulatory Visit: Payer: Self-pay

## 2024-02-23 ENCOUNTER — Other Ambulatory Visit (HOSPITAL_COMMUNITY): Payer: Self-pay

## 2024-03-23 ENCOUNTER — Encounter

## 2024-03-30 ENCOUNTER — Telehealth: Payer: Self-pay

## 2024-03-30 DIAGNOSIS — F0150 Vascular dementia without behavioral disturbance: Secondary | ICD-10-CM

## 2024-03-30 NOTE — Telephone Encounter (Signed)
 See media tab, Prior Authorization for Namzaric  was denied. Please advise

## 2024-03-31 ENCOUNTER — Other Ambulatory Visit (HOSPITAL_COMMUNITY): Payer: Self-pay

## 2024-03-31 MED FILL — Memantine HCl-Donepezil HCl Cap ER 24HR 28-10 MG: ORAL | 30 days supply | Qty: 30 | Fill #1 | Status: CN

## 2024-03-31 NOTE — Telephone Encounter (Signed)
-   discontinue Namzeric 28 mg capsule then start on  Memantine  10 mg capsule one by mouth twice daily and Aricept  10 mg tablet one by mouth daily.

## 2024-03-31 NOTE — Telephone Encounter (Signed)
 Sending to medical assistant in Clinical Intake- Hobart Lulas, CMA

## 2024-04-01 MED ORDER — DONEPEZIL HCL 10 MG PO TABS
10.0000 mg | ORAL_TABLET | Freq: Every day | ORAL | 1 refills | Status: DC
Start: 2024-04-01 — End: 2024-09-25
  Filled 2024-04-01: qty 90, 90d supply, fill #0
  Filled 2024-07-04: qty 90, 90d supply, fill #1

## 2024-04-01 MED ORDER — MEMANTINE HCL 10 MG PO TABS
10.0000 mg | ORAL_TABLET | Freq: Two times a day (BID) | ORAL | 1 refills | Status: DC
Start: 2024-04-01 — End: 2024-09-25
  Filled 2024-04-01: qty 180, 90d supply, fill #0
  Filled 2024-07-04: qty 180, 90d supply, fill #1

## 2024-04-01 NOTE — Telephone Encounter (Signed)
 Spoke with the patient daughter who has been notify to discontinue Namzeric 28 mg capsule then start on  Memantine  10 mg capsule one by mouth twice daily and Aricept  10 mg tablet one by mouth daily.  Patient daughter is asking if Ngetich, Dinah C, NP will send a 90 day supply to the [facility].   Message sent to Ngetich, Dinah C, NP

## 2024-04-01 NOTE — Telephone Encounter (Signed)
 Memantine  and Aricept  90 days supply prescription send to pharmacy.

## 2024-04-02 ENCOUNTER — Other Ambulatory Visit (HOSPITAL_COMMUNITY): Payer: Self-pay

## 2024-04-03 ENCOUNTER — Other Ambulatory Visit: Payer: Self-pay

## 2024-04-11 NOTE — Telephone Encounter (Signed)
 Noted.

## 2024-04-27 ENCOUNTER — Ambulatory Visit (INDEPENDENT_AMBULATORY_CARE_PROVIDER_SITE_OTHER)

## 2024-04-27 DIAGNOSIS — I480 Paroxysmal atrial fibrillation: Secondary | ICD-10-CM | POA: Diagnosis not present

## 2024-04-29 ENCOUNTER — Telehealth: Payer: Self-pay | Admitting: Cardiovascular Disease

## 2024-04-29 NOTE — Telephone Encounter (Signed)
 Returned call to Pt.  Appears her scheduled transmission was in [employer] for review.  Apologized for the inconvenience.  Advised would follow up.  Pt also asking about continued monitoring post end of clinic trial.  Advised that when research trial ends physicians like to continue to  monitor information from loop recorder.  Advised to contact device clinic if any issues with monthly remote monitoring costs.  Pt indicates understanding.

## 2024-04-29 NOTE — Telephone Encounter (Signed)
 Pt states that she rec'vd a call from us  advising of not receiving  her transmissions but prior to she was notified from [employer] that trial?? Had ended. Requesting cb

## 2024-05-01 ENCOUNTER — Other Ambulatory Visit: Payer: Self-pay

## 2024-05-03 DIAGNOSIS — Z006 Encounter for examination for normal comparison and control in clinical research program: Secondary | ICD-10-CM

## 2024-05-03 NOTE — Research (Signed)
 Alleviate HF Research Study  Study Exit  No adverse events or cardiovascular medication changes to report at this time.

## 2024-05-04 LAB — CUP PACEART REMOTE DEVICE CHECK
Date Time Interrogation Session: 20250521135117
Implantable Pulse Generator Implant Date: 20230206

## 2024-05-11 ENCOUNTER — Ambulatory Visit: Payer: Self-pay | Admitting: Cardiovascular Disease

## 2024-05-15 ENCOUNTER — Other Ambulatory Visit: Payer: Self-pay | Admitting: Family

## 2024-05-16 ENCOUNTER — Other Ambulatory Visit: Payer: Self-pay

## 2024-05-16 ENCOUNTER — Other Ambulatory Visit (HOSPITAL_COMMUNITY): Payer: Self-pay

## 2024-05-16 MED ORDER — OXYBUTYNIN CHLORIDE 5 MG PO TABS
5.0000 mg | ORAL_TABLET | Freq: Every day | ORAL | 1 refills | Status: DC
  Filled 2024-05-16: qty 90, 90d supply, fill #0
  Filled 2024-08-12: qty 90, 90d supply, fill #1

## 2024-05-19 ENCOUNTER — Encounter: Payer: Self-pay | Admitting: Cardiovascular Disease

## 2024-05-23 ENCOUNTER — Other Ambulatory Visit: Payer: Medicare HMO

## 2024-05-25 ENCOUNTER — Ambulatory Visit: Payer: Medicare HMO | Admitting: Family

## 2024-05-28 ENCOUNTER — Ambulatory Visit (INDEPENDENT_AMBULATORY_CARE_PROVIDER_SITE_OTHER): Payer: Self-pay

## 2024-05-28 DIAGNOSIS — I480 Paroxysmal atrial fibrillation: Secondary | ICD-10-CM

## 2024-05-30 LAB — CUP PACEART REMOTE DEVICE CHECK
Date Time Interrogation Session: 20250621135438
Implantable Pulse Generator Implant Date: 20230206

## 2024-06-01 ENCOUNTER — Encounter

## 2024-06-05 ENCOUNTER — Other Ambulatory Visit: Payer: Self-pay | Admitting: Adult Health

## 2024-06-05 ENCOUNTER — Other Ambulatory Visit: Payer: Self-pay | Admitting: Family

## 2024-06-05 DIAGNOSIS — I129 Hypertensive chronic kidney disease with stage 1 through stage 4 chronic kidney disease, or unspecified chronic kidney disease: Secondary | ICD-10-CM

## 2024-06-05 MED FILL — Simvastatin Tab 40 MG: ORAL | 90 days supply | Qty: 90 | Fill #1 | Status: AC

## 2024-06-06 ENCOUNTER — Other Ambulatory Visit (HOSPITAL_COMMUNITY): Payer: Self-pay

## 2024-06-06 ENCOUNTER — Ambulatory Visit: Payer: Self-pay | Admitting: Cardiovascular Disease

## 2024-06-06 ENCOUNTER — Other Ambulatory Visit: Payer: Self-pay

## 2024-06-06 MED ORDER — HYDRALAZINE HCL 25 MG PO TABS
25.0000 mg | ORAL_TABLET | Freq: Three times a day (TID) | ORAL | 1 refills | Status: DC
  Filled 2024-06-06 – 2024-07-08 (×3): qty 270, 90d supply, fill #0

## 2024-06-06 NOTE — Telephone Encounter (Signed)
 High risk warning

## 2024-06-07 ENCOUNTER — Other Ambulatory Visit: Payer: Self-pay

## 2024-06-07 ENCOUNTER — Other Ambulatory Visit (HOSPITAL_COMMUNITY): Payer: Self-pay

## 2024-06-07 MED ORDER — LOSARTAN POTASSIUM 100 MG PO TABS
100.0000 mg | ORAL_TABLET | Freq: Every day | ORAL | 1 refills | Status: AC
  Filled 2024-06-07 – 2024-07-30 (×2): qty 90, 90d supply, fill #0
  Filled 2024-11-06: qty 90, 90d supply, fill #1

## 2024-06-14 ENCOUNTER — Other Ambulatory Visit

## 2024-06-15 ENCOUNTER — Ambulatory Visit (INDEPENDENT_AMBULATORY_CARE_PROVIDER_SITE_OTHER): Admitting: Adult Health

## 2024-06-15 ENCOUNTER — Encounter: Payer: Self-pay | Admitting: Adult Health

## 2024-06-15 DIAGNOSIS — Z0000 Encounter for general adult medical examination without abnormal findings: Secondary | ICD-10-CM

## 2024-06-15 NOTE — Progress Notes (Signed)
 Subjective:   [name] is a 78 y.o. female who presents for Medicare Annual (Subsequent) preventive examination.  Visit Complete: Virtual I connected with  [name] on 06/15/24 by a video and audio enabled telemedicine application and verified that I am speaking with the correct person using two identifiers.  Patient Location: Home  Provider Location: Office/Clinic  I discussed the limitations of evaluation and management by telemedicine. The patient expressed understanding and agreed to proceed.  Vital Signs: Because this visit was a virtual/telehealth visit, some criteria may be missing or patient reported. Any vitals not documented were not able to be obtained and vitals that have been documented are patient reported.  Patient Medicare AWV questionnaire was completed by the patient on [id number]; I have confirmed that all information answered by patient is correct and no changes since this date.  Cardiac Risk Factors include: advanced age (>34men, >80 women);sedentary lifestyle     Objective:    There were no vitals filed for this visit. There is no height or weight on file to calculate BMI.     06/15/2024    2:52 PM 09/03/2023    2:42 PM 06/15/2023   12:52 PM 06/05/2022   12:33 PM 05/12/2022    1:44 PM 03/21/2022    1:03 PM 11/14/2021    3:03 PM  Advanced Directives  Does Patient Have a Medical Advance Directive? Yes No Yes Yes Yes Yes No  Type of Forensic scientist of [employer] Power of [employer] Power of Attorney Healthcare Power of Attorney   Does patient want to make changes to medical advance directive? No - Patient declined  No - Patient declined  No - Patient declined No - Patient declined   Copy of Healthcare Power of Attorney in Chart? No - copy requested  No - copy requested  No - copy requested No - copy requested    Would patient like information on creating a medical advance directive?       No -  Patient declined     Significant value    Current Medications (verified) Outpatient Encounter Medications as of 06/15/2024  Medication Sig   alendronate  (FOSAMAX ) 70 MG tablet Take 1 tablet (70 mg total) by mouth every 7 (seven) days. TAKE WITH A FULL GLASS OF WATER ON AN EMPTY STOMACH.   apixaban  (ELIQUIS ) 5 MG TABS tablet Take 1 tablet (5 mg total) by mouth 2 (two) times daily.   Calcium Carb-Cholecalciferol (CALCIUM 600+D3 PO) Take 1 tablet by mouth 2 (two) times daily.   cholecalciferol (VITAMIN D) 1000 units tablet Take 1,000 Units by mouth daily.   donepezil  (ARICEPT ) 10 MG tablet Take 1 tablet (10 mg total) by mouth at bedtime.   furosemide  (LASIX ) 20 MG tablet As needed patient may take 20 MG Furosemide  as needed by mouth once daily x 4 days as directed per [employer] HF Study PRN plan. DO NOT REMOVE   hydrALAZINE  (APRESOLINE ) 25 MG tablet Take 1 tablet (25 mg total) by mouth 3 (three) times daily with meals to control blood pressure.   losartan  (COZAAR ) 100 MG tablet Take 1 tablet (100 mg total) by mouth daily.   memantine  (NAMENDA ) 10 MG tablet Take 1 tablet (10 mg total) by mouth 2 (two) times daily.   metoprolol  tartrate (LOPRESSOR ) 25 MG tablet Take 1/2 tablet (12.5 mg total) by mouth 2 (two) times daily.   Multiple Vitamin (MULTIVITAMIN) tablet Take 1 tablet by mouth  daily.   oxybutynin  (DITROPAN ) 5 MG tablet Take 1 tablet (5 mg total) by mouth daily for bladder control.   potassium chloride  SA (KLOR-CON  M) 20 MEQ tablet As needed patient may take 20 mEq Potassium as needed by mouth once daily x 4 days as directed per [employer] HF Study PRN plan. DO NOT REMOVE ORDER   simvastatin  (ZOCOR ) 40 MG tablet Take 1 tablet (40 mg total) by mouth daily at 6 PM.   apixaban  (ELIQUIS ) 5 MG TABS tablet TAKE 1 TABLET TWICE DAILY   ciclopirox  (PENLAC ) 8 % solution Apply topically daily. Appy daily for 7 days then remove with alcohol or nail polish remover.  Repeat process.  (Patient not taking: Reported on 06/15/2024)   metoprolol  tartrate (LOPRESSOR ) 25 MG tablet TAKE 1/2 TABLET TWICE DAILY   Facility-Administered Encounter Medications as of 06/15/2024  Medication   lidocaine -EPINEPHrine  (XYLOCAINE  W/EPI) 1 %-1:100000 (with pres) injection 10 mL    Allergies (verified) Patient has no known allergies.   History: Past Medical History:  Diagnosis Date   Cataract    slow growing    Chronic kidney disease    Hyperlipidemia    Hypertension    Insomnia, unspecified    Memory loss    mild dementia per daughter    Osteopenia    Other abnormal blood chemistry    Other specified cardiac dysrhythmias(427.89)    Other specified disease of nail    Stroke (HCC)    2006 or 2007 per daughter    Syncope and collapse    Unspecified disorder of kidney and ureter    Unspecified late effects of cerebrovascular disease    Unspecified transient cerebral ischemia    Unspecified urinary incontinence    Unspecified vitamin D deficiency    Vascular dementia, uncomplicated (HCC)    Past Surgical History:  Procedure Laterality Date   CATARACT EXTRACTION Left 09/2022   CATARACT EXTRACTION Right 11/2021   NO PAST SURGERIES     Family History  Problem Relation Age of Onset   Pneumonia Mother    Kidney disease Sister    Hypertension Brother    Colon cancer Neg Hx    Colon polyps Neg Hx    Esophageal cancer Neg Hx    Rectal cancer Neg Hx    Stomach cancer Neg Hx    Social History   Socioeconomic History   Marital status: Single    Spouse name: Not on file   Number of children: Not on file   Years of education: Not on file   Highest education level: Not on file  Occupational History   Not on file  Tobacco Use   Smoking status: Former    Current packs/day: 0.00    Types: Cigarettes    Start date: 12/08/1976    Quit date: 12/08/2006    Years since quitting: 17.5   Smokeless tobacco: Never  Vaping Use   Vaping status: Never Used  Substance and Sexual Activity    Alcohol use: No   Drug use: No   Sexual activity: Never  Other Topics Concern   Not on file  Social History Narrative   Not on file   Social Drivers of Health   Financial Resource Strain: Low Risk  (12/25/2017)   Overall Financial Resource Strain (CARDIA)    Difficulty of Paying Living Expenses: Not hard at all  Food Insecurity: No Food Insecurity (06/15/2024)   Hunger Vital Sign    Worried About Running Out of Food in the Last  Year: Never true    Ran Out of Food in the Last Year: Never true  Transportation Needs: No Transportation Needs (06/15/2024)   PRAPARE - Administrator, Civil Service (Medical): No    Lack of Transportation (Non-Medical): No  Physical Activity: Inactive (12/25/2017)   Exercise Vital Sign    Days of Exercise per Week: 0 days    Minutes of Exercise per Session: 0 min  Stress: No Stress Concern Present (12/25/2017)   [employer] of Occupational Health - Occupational Stress Questionnaire    Feeling of Stress : Only a little  Social Connections: Somewhat Isolated (12/25/2017)   Social Connection and Isolation Panel    Frequency of Communication with Friends and Family: Once a week    Frequency of Social Gatherings with Friends and Family: More than three times a week    Attends Religious Services: 1 to 4 times per year    Active Member of [employer] or Organizations: No    Attends Engineer, structural: Never    Marital Status: Never married    Tobacco Counseling Counseling given: Not Answered   Clinical Intake:  Pre-visit preparation completed: Yes  Pain : No/denies pain     Nutritional Status: BMI 25 -29 Overweight Nutritional Risks: Unintentional weight gain, Unintentional weight loss Diabetes: No     Interpreter Needed?: No      Activities of Daily Living    06/15/2024    3:17 PM  In your present state of health, do you have any difficulty performing the following activities:  Hearing? 0  Vision? 0  Difficulty  concentrating or making decisions? 1  Walking or climbing stairs? 1  Dressing or bathing? 1  Doing errands, shopping? 1  Preparing Food and eating ? Y  Using the Toilet? Y  In the past six months, have you accidently leaked urine? Y  Do you have problems with loss of bowel control? Y  Managing your Medications? Y  Managing your Finances? Y    Patient Care Team: Ngetich, Dinah C, NP as PCP - General (Family Medicine) Nahser, Aleene PARAS, MD as PCP - Cardiology (Cardiology) Cleatus Collar, MD as Consulting Physician (Ophthalmology)  Indicate any recent Medical Services you may have received from other than [facility] providers in the past year (date may be approximate).     Assessment:   This is a routine wellness examination for [place].  Hearing/Vision screen Hearing Screening - Comments:: No hearing issues Vision Screening - Comments:: Eye exam Dec.2024 after cataract surgery Dr.Frazier Hecker eye    Goals Addressed   None    Depression Screen    06/15/2024    2:54 PM 09/03/2023    2:42 PM 06/15/2023   12:52 PM 06/05/2022   12:34 PM 03/21/2022    1:02 PM 05/28/2021    1:52 PM 04/23/2020    1:12 PM  PHQ 2/9 Scores  PHQ - 2 Score 0 0 0 0 0 0 0    Fall Risk    06/15/2024    3:19 PM 06/15/2024    2:54 PM 11/19/2023   11:10 AM 09/03/2023    2:42 PM 06/15/2023   12:52 PM  Fall Risk   Falls in the past year? 1 0 0 1 1  Number falls in past yr: 0 0 0 0 0  Injury with Fall? 1 0 0 1 0  Risk for fall due to : History of fall(s);Impaired balance/gait;Impaired mobility No Fall Risks History of fall(s)  History of fall(s);Impaired  balance/gait  Follow up Falls evaluation completed Falls evaluation completed Falls evaluation completed  Falls evaluation completed    MEDICARE RISK AT HOME: Medicare Risk at Home Any stairs in or around the home?: No Home free of loose throw rugs in walkways, pet beds, electrical cords, etc?: No Adequate lighting in your home to reduce risk of falls?: Yes Life  alert?: No Use of a cane, walker or w/c?: Yes Grab bars in the bathroom?: No Shower chair or bench in shower?: Yes Elevated toilet seat or a handicapped toilet?: Yes  TIMED UP AND GO:  Was the test performed?  No    Cognitive Function:    12/25/2017    8:48 AM 09/11/2016    9:28 AM 08/06/2015    2:07 PM  MMSE - Mini Mental State Exam  Orientation to time 4  3  5    Orientation to Place 4  4  3    Registration 3  3  3    Attention/ Calculation 5  5  5    Recall 0  0  0   Language- name 2 objects 2  2  2    Language- repeat 1 1 1   Language- follow 3 step command 3  3  3    Language- read & follow direction 1  1  1    Write a sentence 1  0  1   Copy design 0  0  0   Total score 24  22  24       Data saved with a previous flowsheet row definition        06/15/2024    2:55 PM 06/15/2023   12:55 PM 06/05/2022   12:34 PM 05/28/2021    1:53 PM 04/23/2020    1:12 PM  6CIT Screen  What Year? 0 points 4 points 0 points 0 points 4 points  What month? 0 points 0 points 0 points 3 points 0 points  What time? 0 points 0 points 3 points 0 points 0 points  Count back from 20 4 points 0 points 0 points 4 points 4 points  Months in reverse 4 points 4 points 4 points 4 points 4 points  Repeat phrase 10 points 10 points 6 points 10 points 10 points  Total Score 18 points 18 points 13 points 21 points 22 points    Immunizations Immunization History  Administered Date(s) Administered   Fluad Quad(high Dose 65+) 11/22/2020, 09/06/2021, 10/22/2022   Fluad Trivalent(High Dose 65+) 11/19/2023   Influenza, High Dose Seasonal PF 12/25/2017, 08/19/2018   Influenza,inj,Quad PF,6+ Mos 10/17/2013, 12/07/2015, 09/01/2016   Influenza-Unspecified 08/25/2008, 09/12/2011, 09/27/2012   Moderna Covid-19 Fall Seasonal Vaccine 49yrs & older 10/05/2022   PFIZER(Purple Top)SARS-COV-2 Vaccination 12/27/2019, 01/17/2020   Pneumococcal Conjugate-13 08/04/2014   Pneumococcal Polysaccharide-23 03/21/2022    Pneumococcal-Unspecified 05/07/2011   Respiratory Syncytial Virus Vaccine,Recomb Aduvanted(Arexvy) 10/05/2022   Tdap 05/07/2011   Zoster Recombinant(Shingrix) 10/05/2022, 12/09/2022    TDAP status: Due, Education has been provided regarding the importance of this vaccine. Advised may receive this vaccine at local pharmacy or Health Dept. Aware to provide a copy of the vaccination record if obtained from local pharmacy or Health Dept. Verbalized acceptance and understanding.  Flu Vaccine status: Up to date  Pneumococcal vaccine status: Up to date  Covid-19 vaccine status: Completed vaccines  Qualifies for Shingles Vaccine? No   Zostavax completed Yes   Shingrix Completed?: Yes  Screening Tests Health Maintenance  Topic Date Due   DTaP/Tdap/Td (2 - Td or Tdap) 05/06/2021  COVID-19 Vaccine (4 - 2024-25 season) 08/09/2023   Medicare Annual Wellness (AWV)  06/14/2024   INFLUENZA VACCINE  07/08/2024   Pneumococcal Vaccine: 50+ Years  Completed   DEXA SCAN  Completed   Zoster Vaccines- Shingrix  Completed   Hepatitis B Vaccines  Aged Out   HPV VACCINES  Aged Out   Meningococcal B Vaccine  Aged Out   Hepatitis C Screening  Discontinued   Fecal DNA (Cologuard)  Discontinued    Health Maintenance  Health Maintenance Due  Topic Date Due   DTaP/Tdap/Td (2 - Td or Tdap) 05/06/2021   COVID-19 Vaccine (4 - 2024-25 season) 08/09/2023   Medicare Annual Wellness (AWV)  06/14/2024    Colorectal cancer screening: No longer required.   Mammogram status: No longer required due to  .  Bone Density status: Completed 2022. Results reflect: Bone density results: OSTEOPOROSIS. Repeat every   years.  Lung Cancer Screening: (Low Dose CT Chest recommended if Age 56-80 years, 20 pack-year currently smoking OR have quit w/in 15years.) does not qualify.   Lung Cancer Screening Referral:   Additional Screening:  Hepatitis C Screening: does not qualify; Completed   Vision Screening:  Recommended annual ophthalmology exams for early detection of glaucoma and other disorders of the eye. Is the patient up to date with their annual eye exam?  Yes  Who is the provider or what is the name of the office in which the patient attends annual eye exams?  If pt is not established with a provider, would they like to be referred to a provider to establish care? Yes .   Dental Screening: Recommended annual dental exams for proper oral hygiene  Diabetic Foot Exam:   Community Resource Referral / Chronic Care Management: CRR required this visit?  No   CCM required this visit?  No     Plan:     I have personally reviewed and noted the following in the patient's chart:   Medical and social history Use of alcohol, tobacco or illicit drugs  Current medications and supplements including opioid prescriptions. Patient is not currently taking opioid prescriptions. Functional ability and status Nutritional status Physical activity Advanced directives List of other physicians Hospitalizations, surgeries, and ER visits in previous 12 months Vitals Screenings to include cognitive, depression, and falls Referrals and appointments  In addition, I have reviewed and discussed with patient certain preventive protocols, quality metrics, and best practice recommendations. A written personalized care plan for preventive services as well as general preventive health recommendations were provided to patient.     Barnie GORMAN Seip, NP   06/15/2024   After Visit Summary: (MyChart) Due to this being a telephonic visit, the after visit summary with patients personalized plan was offered to patient via MyChart   Nurse Notes:

## 2024-06-15 NOTE — Progress Notes (Signed)
   This service is provided via telemedicine  No vital signs collected/recorded due to the encounter was a telemedicine visit.   Location of patient (ex: home, work):  Home  Patient consents to a telephone visit: Yes  Location of the provider (ex: office, home):  [facility], Office   Name of any referring provider:  N/A  Names of all persons participating in the telemedicine service and their role in the encounter:  Shelli Ferrier, CMA, Patient, and Landy Sober S,NP  Time spent on call:  9 min with medical assistant

## 2024-06-16 ENCOUNTER — Encounter: Payer: Medicare HMO | Admitting: Adult Health

## 2024-06-16 NOTE — Progress Notes (Signed)
 [facility] Summary Report / Loop Recorder

## 2024-06-16 NOTE — Addendum Note (Signed)
 Addended by: VICCI SELLER A on: 06/16/2024 08:35 AM   Modules accepted: Orders

## 2024-06-17 ENCOUNTER — Ambulatory Visit: Payer: Self-pay | Admitting: Family

## 2024-06-28 ENCOUNTER — Ambulatory Visit (INDEPENDENT_AMBULATORY_CARE_PROVIDER_SITE_OTHER): Payer: Self-pay

## 2024-06-28 ENCOUNTER — Other Ambulatory Visit

## 2024-06-28 DIAGNOSIS — N183 Chronic kidney disease, stage 3 unspecified: Secondary | ICD-10-CM | POA: Diagnosis not present

## 2024-06-28 DIAGNOSIS — I480 Paroxysmal atrial fibrillation: Secondary | ICD-10-CM

## 2024-06-28 DIAGNOSIS — I129 Hypertensive chronic kidney disease with stage 1 through stage 4 chronic kidney disease, or unspecified chronic kidney disease: Secondary | ICD-10-CM | POA: Diagnosis not present

## 2024-06-28 DIAGNOSIS — E782 Mixed hyperlipidemia: Secondary | ICD-10-CM | POA: Diagnosis not present

## 2024-06-28 LAB — COMPREHENSIVE METABOLIC PANEL WITH GFR
AG Ratio: 1.3 (calc) (ref 1.0–2.5)
ALT: 11 U/L (ref 6–29)
AST: 14 U/L (ref 10–35)
Albumin: 3.8 g/dL (ref 3.6–5.1)
Alkaline phosphatase (APISO): 62 U/L (ref 37–153)
BUN/Creatinine Ratio: 13 (calc) (ref 6–22)
BUN: 18 mg/dL (ref 7–25)
CO2: 28 mmol/L (ref 20–32)
Calcium: 9.5 mg/dL (ref 8.6–10.4)
Chloride: 107 mmol/L (ref 98–110)
Creat: 1.34 mg/dL — ABNORMAL HIGH (ref 0.60–0.95)
Globulin: 2.9 g/dL (ref 1.9–3.7)
Glucose, Bld: 107 mg/dL — ABNORMAL HIGH (ref 65–99)
Potassium: 3.9 mmol/L (ref 3.5–5.3)
Sodium: 144 mmol/L (ref 135–146)
Total Bilirubin: 0.8 mg/dL (ref 0.2–1.2)
Total Protein: 6.7 g/dL (ref 6.1–8.1)
eGFR: 40 mL/min/1.73m2 — ABNORMAL LOW (ref >=60)

## 2024-06-28 LAB — CBC WITH DIFFERENTIAL/PLATELET
Absolute Lymphocytes: 3717 {cells}/uL (ref 850–3900)
Absolute Monocytes: 539 {cells}/uL (ref 200–950)
Basophils Absolute: 21 {cells}/uL (ref 0–200)
Basophils Relative: 0.3 %
Eosinophils Absolute: 119 {cells}/uL (ref 15–500)
Eosinophils Relative: 1.7 %
HCT: 41.7 % (ref 35.0–45.0)
Hemoglobin: 13.1 g/dL (ref 11.7–15.5)
MCH: 28.7 pg (ref 27.0–33.0)
MCHC: 31.4 g/dL — ABNORMAL LOW (ref 32.0–36.0)
MCV: 91.4 fL (ref 80.0–100.0)
MPV: 10.2 fL (ref 7.5–12.5)
Monocytes Relative: 7.7 %
Neutro Abs: 2604 {cells}/uL (ref 1500–7800)
Neutrophils Relative %: 37.2 %
Platelets: 222 Thousand/uL (ref 140–400)
RBC: 4.56 Million/uL (ref 3.80–5.10)
RDW: 11.8 % (ref 11.0–15.0)
Total Lymphocyte: 53.1 %
WBC: 7 Thousand/uL (ref 3.8–10.8)

## 2024-06-28 LAB — LIPID PANEL
Cholesterol: 156 mg/dL (ref <200)
HDL: 63 mg/dL (ref >=50)
LDL Cholesterol (Calc): 79 mg/dL
Non-HDL Cholesterol (Calc): 93 mg/dL (ref <130)
Total CHOL/HDL Ratio: 2.5 (calc) (ref <5.0)
Triglycerides: 63 mg/dL (ref <150)

## 2024-06-28 LAB — TSH: TSH: 1.11 m[IU]/L (ref 0.40–4.50)

## 2024-06-29 ENCOUNTER — Ambulatory Visit: Payer: Self-pay | Admitting: Cardiovascular Disease

## 2024-06-29 LAB — CUP PACEART REMOTE DEVICE CHECK
Date Time Interrogation Session: 20250722135417
Implantable Pulse Generator Implant Date: 20230206

## 2024-06-30 ENCOUNTER — Ambulatory Visit (INDEPENDENT_AMBULATORY_CARE_PROVIDER_SITE_OTHER): Payer: Self-pay | Admitting: Orthopedic Surgery

## 2024-06-30 ENCOUNTER — Encounter: Payer: Self-pay | Admitting: Orthopedic Surgery

## 2024-06-30 VITALS — BP 130/88 | HR 58 | Temp 97.3°F | Resp 17 | Ht 63.0 in | Wt 238.0 lb

## 2024-06-30 DIAGNOSIS — R2681 Unsteadiness on feet: Secondary | ICD-10-CM

## 2024-06-30 DIAGNOSIS — I480 Paroxysmal atrial fibrillation: Secondary | ICD-10-CM

## 2024-06-30 DIAGNOSIS — I129 Hypertensive chronic kidney disease with stage 1 through stage 4 chronic kidney disease, or unspecified chronic kidney disease: Secondary | ICD-10-CM

## 2024-06-30 DIAGNOSIS — N183 Chronic kidney disease, stage 3 unspecified: Secondary | ICD-10-CM | POA: Diagnosis not present

## 2024-06-30 DIAGNOSIS — R739 Hyperglycemia, unspecified: Secondary | ICD-10-CM

## 2024-06-30 DIAGNOSIS — E782 Mixed hyperlipidemia: Secondary | ICD-10-CM | POA: Diagnosis not present

## 2024-06-30 DIAGNOSIS — N3281 Overactive bladder: Secondary | ICD-10-CM | POA: Diagnosis not present

## 2024-06-30 DIAGNOSIS — F0150 Vascular dementia without behavioral disturbance: Secondary | ICD-10-CM

## 2024-06-30 NOTE — Patient Instructions (Signed)
 Continue weight loss efforts> you are doing great!

## 2024-07-01 NOTE — Progress Notes (Signed)
 Careteam: Patient Care Team: Ngetich, Roxan BROCKS, NP as PCP - General (Family Medicine) Nahser, Aleene PARAS, MD (Inactive) as PCP - Cardiology (Cardiology) Cleatus Collar, MD as Consulting Physician (Ophthalmology)  Seen by: Greig Cluster, AGNP-C  PLACE OF SERVICE:  [facility]  Advanced Directive information Does Patient Have a Medical Advance Directive?: Yes, Type of Advance Directive: Healthcare Power of Attorney, Does patient want to make changes to medical advance directive?: No - Patient declined  No Known Allergies  Chief Complaint  Patient presents with   Medical Management of Chronic Issues    6 month follow up. Discuss the need for DTAP vaccine, and Covid Booster.   Labs     Discuss recent lab results.     HPI: Patient is a 78 y.o. female seen today for medical management of chronic conditions.   Discussed the use of AI scribe software for clinical note transcription with the patient, who gave verbal consent to proceed.  History of Present Illness    Daughter present during encounter.   Recent lab work discussed.   Glucose mildly elevated. Daughter does not want to do A1c yet. They are focusing on diet rich in vegetables and protein. She has lost 7 lbs from 11/2023.   H/o CKD, does not always drink water well. She does use furosemide  prn for lower leg edema. She does have a h/o CHF. She is enrolled in HF research study per chart review.   H/o atrial fibrillation. HR<100 today. She also had loop recorder> recent check no significant events. Remains on metoprolol  and Eliquis . Denies abnormal bleeding.   Her memory has stabilized after a decline post-COVID, though she occasionally forgets recent meals. She is able to perform daily activities like dressing and brushing her teeth, though she requires assistance with bathroom use due to past falls. Recent 6CIT score 18. No agitation per daughter. She is very pleasant during our encounter today. Remains on Namenda  and Aricept .    Review of Systems:  Review of Systems  Unable to perform ROS: Dementia    Past Medical History:  Diagnosis Date   Cataract    slow growing    Chronic kidney disease    Hyperlipidemia    Hypertension    Insomnia, unspecified    Memory loss    mild dementia per daughter    Osteopenia    Other abnormal blood chemistry    Other specified cardiac dysrhythmias(427.89)    Other specified disease of nail    Stroke (HCC)    2006 or 2007 per daughter    Syncope and collapse    Unspecified disorder of kidney and ureter    Unspecified late effects of cerebrovascular disease    Unspecified transient cerebral ischemia    Unspecified urinary incontinence    Unspecified vitamin D deficiency    Vascular dementia, uncomplicated (HCC)    Past Surgical History:  Procedure Laterality Date   CATARACT EXTRACTION Left 09/2022   CATARACT EXTRACTION Right 11/2021   NO PAST SURGERIES     Social History:   reports that she quit smoking about 17 years ago. Her smoking use included cigarettes. She started smoking about 47 years ago. She has never used smokeless tobacco. She reports that she does not drink alcohol and does not use drugs.  Family History  Problem Relation Age of Onset   Pneumonia Mother    Kidney disease Sister    Hypertension Brother    Colon cancer Neg Hx    Colon polyps  Neg Hx    Esophageal cancer Neg Hx    Rectal cancer Neg Hx    Stomach cancer Neg Hx     Medications: Patient's Medications  New Prescriptions   No medications on file  Previous Medications   ALENDRONATE  (FOSAMAX ) 70 MG TABLET    Take 1 tablet (70 mg total) by mouth every 7 (seven) days. TAKE WITH A FULL GLASS OF WATER ON AN EMPTY STOMACH.   APIXABAN  (ELIQUIS ) 5 MG TABS TABLET    Take 1 tablet (5 mg total) by mouth 2 (two) times daily.   CALCIUM CARB-CHOLECALCIFEROL (CALCIUM 600+D3 PO)    Take 1 tablet by mouth 2 (two) times daily.   CHOLECALCIFEROL (VITAMIN D) 1000 UNITS TABLET    Take 1,000 Units by  mouth daily.   DONEPEZIL  (ARICEPT ) 10 MG TABLET    Take 1 tablet (10 mg total) by mouth at bedtime.   FUROSEMIDE  (LASIX ) 20 MG TABLET    As needed patient may take 20 MG Furosemide  as needed by mouth once daily x 4 days as directed per [employer] HF Study PRN plan. DO NOT REMOVE   HYDRALAZINE  (APRESOLINE ) 25 MG TABLET    Take 1 tablet (25 mg total) by mouth 3 (three) times daily with meals to control blood pressure.   LOSARTAN  (COZAAR ) 100 MG TABLET    Take 1 tablet (100 mg total) by mouth daily.   MEMANTINE  (NAMENDA ) 10 MG TABLET    Take 1 tablet (10 mg total) by mouth 2 (two) times daily.   METOPROLOL  TARTRATE (LOPRESSOR ) 25 MG TABLET    Take 1/2 tablet (12.5 mg total) by mouth 2 (two) times daily.   MULTIPLE VITAMIN (MULTIVITAMIN) TABLET    Take 1 tablet by mouth daily.   OXYBUTYNIN  (DITROPAN ) 5 MG TABLET    Take 1 tablet (5 mg total) by mouth daily for bladder control.   POTASSIUM CHLORIDE  SA (KLOR-CON  M) 20 MEQ TABLET    As needed patient may take 20 mEq Potassium as needed by mouth once daily x 4 days as directed per [employer] HF Study PRN plan. DO NOT REMOVE ORDER   SIMVASTATIN  (ZOCOR ) 40 MG TABLET    Take 1 tablet (40 mg total) by mouth daily at 6 PM.  Modified Medications   No medications on file  Discontinued Medications   APIXABAN  (ELIQUIS ) 5 MG TABS TABLET    TAKE 1 TABLET TWICE DAILY   CICLOPIROX  (PENLAC ) 8 % SOLUTION    Apply topically daily. Appy daily for 7 days then remove with alcohol or nail polish remover.  Repeat process.   METOPROLOL  TARTRATE (LOPRESSOR ) 25 MG TABLET    TAKE 1/2 TABLET TWICE DAILY    Physical Exam:  Vitals:   06/30/24 1543  BP: 130/88  Pulse: (!) 58  Resp: 17  Temp: (!) 97.3 F (36.3 C)  SpO2: 96%  Weight: 238 lb (108 kg)  Height: 5' 3 (1.6 m)   Body mass index is 42.16 kg/m. Wt Readings from Last 3 Encounters:  06/30/24 238 lb (108 kg)  11/19/23 245 lb 3.2 oz (111.2 kg)  09/03/23 247 lb (112 kg)    Physical Exam Vitals  reviewed.  Constitutional:      General: She is not in acute distress.    Appearance: She is obese.  HENT:     Head: Normocephalic.  Eyes:     General:        Right eye: No discharge.  Left eye: No discharge.  Cardiovascular:     Rate and Rhythm: Normal rate. Rhythm irregular.     Pulses: Normal pulses.     Heart sounds: Normal heart sounds.  Pulmonary:     Effort: Pulmonary effort is normal.     Breath sounds: Normal breath sounds.  Abdominal:     General: Bowel sounds are normal. There is no distension.     Palpations: Abdomen is soft.     Tenderness: There is no abdominal tenderness.  Musculoskeletal:     Cervical back: Neck supple.     Right lower leg: No edema.     Left lower leg: No edema.  Skin:    General: Skin is warm.     Capillary Refill: Capillary refill takes less than 2 seconds.  Neurological:     General: No focal deficit present.     Mental Status: She is alert. Mental status is at baseline.     Gait: Gait abnormal.  Psychiatric:        Mood and Affect: Mood normal.     Labs reviewed: Basic Metabolic Panel: Recent Labs    11/17/23 1020 06/28/24 1028  NA 142 144  K 4.2 3.9  CL 105 107  CO2 28 28  GLUCOSE 100* 107*  BUN 23 18  CREATININE 1.46* 1.34*  CALCIUM 9.5 9.5  TSH 1.48 1.11   Liver Function Tests: Recent Labs    11/17/23 1020 06/28/24 1028  AST 18 14  ALT 15 11  BILITOT 0.4 0.8  PROT 6.7 6.7   No results for input(s): LIPASE, AMYLASE in the last 8760 hours. No results for input(s): AMMONIA in the last 8760 hours. CBC: Recent Labs    11/17/23 1020 06/28/24 1028  WBC 7.5 7.0  NEUTROABS 2,850 2,604  HGB 13.4 13.1  HCT 41.5 41.7  MCV 88.3 91.4  PLT 234 222   Lipid Panel: Recent Labs    11/17/23 1020 06/28/24 1028  CHOL 159 156  HDL 67 63  LDLCALC 78 79  TRIG 62 63  CHOLHDL 2.4 2.5   TSH: Recent Labs    11/17/23 1020 06/28/24 1028  TSH 1.48 1.11   A1C: Lab Results  Component Value Date    HGBA1C 5.4 05/02/2021     Assessment/Plan 1. Benign hypertension with CKD (chronic kidney disease) stage III (HCC) (Primary) - BUN/creat 18/1.34, GFR 40 06/28/2024 - bp 130/88, goal < 150/90 - cont hydralazine , losartan  and metoprolol  - cont furosemide  prn  2. Vascular dementia, uncomplicated (HCC) - recent 6CIT score 18 - no agitation  - able to perform some ADLs - weight stable - transfers with assist - cont Aricept  and Namenda   3. AF (paroxysmal atrial fibrillation) (HCC) - recent loop recorder readings with no significant events - HR< 100 with metoprolol  - cont Eliquis  for clot prevention  4. Mixed hyperlipidemia - total 156, LDL 79 06/28/2024 - cont simvastatin   5. Unstable gait - 1+ assist with transfers - ambulates with wheelchair  6. Overactive bladder - cont oxybutynin   7. Morbid obesity (HCC) - BMI 42.16 - weight down 7 lbs from last encounter   8. Hyperglycemia - glucose 107 (07/22)> was 100 11/2023> was 106 05/2023 - HPOA declined A1c  - cont weight loss efforts  Total time: 36 minutes. Greater than 50% of total time spent doing patient education regarding dementia, HTN, CKD, HLD, health maintenance and recent labs including symptom/medication management.      Next appt: Greig FORBES Cluster, NP  Amberly Livas  Gil BODILY  [facility] [phone]

## 2024-07-04 ENCOUNTER — Other Ambulatory Visit (HOSPITAL_COMMUNITY): Payer: Self-pay

## 2024-07-04 ENCOUNTER — Other Ambulatory Visit: Payer: Self-pay

## 2024-07-05 ENCOUNTER — Ambulatory Visit: Payer: Self-pay | Admitting: Family

## 2024-07-06 ENCOUNTER — Encounter

## 2024-07-06 NOTE — Progress Notes (Signed)
 [facility] Summary Report / Loop Recorder

## 2024-07-06 NOTE — Addendum Note (Signed)
 Addended by: TAWNI DRILLING D on: 07/06/2024 02:05 PM   Modules accepted: Orders

## 2024-07-08 ENCOUNTER — Other Ambulatory Visit: Payer: Self-pay

## 2024-07-18 ENCOUNTER — Other Ambulatory Visit: Payer: Self-pay

## 2024-07-18 ENCOUNTER — Other Ambulatory Visit (HOSPITAL_COMMUNITY): Payer: Self-pay

## 2024-07-29 ENCOUNTER — Ambulatory Visit (INDEPENDENT_AMBULATORY_CARE_PROVIDER_SITE_OTHER): Payer: Self-pay

## 2024-07-29 DIAGNOSIS — I480 Paroxysmal atrial fibrillation: Secondary | ICD-10-CM | POA: Diagnosis not present

## 2024-07-30 ENCOUNTER — Other Ambulatory Visit (HOSPITAL_COMMUNITY): Payer: Self-pay

## 2024-08-01 ENCOUNTER — Other Ambulatory Visit: Payer: Self-pay

## 2024-08-01 ENCOUNTER — Ambulatory Visit: Payer: Self-pay | Admitting: Cardiovascular Disease

## 2024-08-01 LAB — CUP PACEART REMOTE DEVICE CHECK
Date Time Interrogation Session: 20250822140157
Implantable Pulse Generator Implant Date: 20230206

## 2024-08-10 ENCOUNTER — Encounter

## 2024-08-12 ENCOUNTER — Other Ambulatory Visit: Payer: Self-pay

## 2024-08-12 ENCOUNTER — Other Ambulatory Visit (HOSPITAL_COMMUNITY): Payer: Self-pay

## 2024-08-12 ENCOUNTER — Other Ambulatory Visit: Payer: Self-pay | Admitting: Family

## 2024-08-12 DIAGNOSIS — M810 Age-related osteoporosis without current pathological fracture: Secondary | ICD-10-CM

## 2024-08-14 ENCOUNTER — Other Ambulatory Visit: Payer: Self-pay | Admitting: Family

## 2024-08-14 DIAGNOSIS — M810 Age-related osteoporosis without current pathological fracture: Secondary | ICD-10-CM

## 2024-08-14 MED ORDER — ALENDRONATE SODIUM 70 MG PO TABS
70.0000 mg | ORAL_TABLET | ORAL | 3 refills | Status: AC
  Filled 2024-08-14: qty 12, 84d supply, fill #0
  Filled 2024-11-06: qty 12, 84d supply, fill #1

## 2024-08-15 ENCOUNTER — Other Ambulatory Visit (HOSPITAL_BASED_OUTPATIENT_CLINIC_OR_DEPARTMENT_OTHER): Payer: Self-pay

## 2024-08-15 ENCOUNTER — Other Ambulatory Visit: Payer: Self-pay

## 2024-08-24 NOTE — Progress Notes (Signed)
 Remote Loop Recorder Transmission

## 2024-08-26 ENCOUNTER — Other Ambulatory Visit: Payer: Self-pay

## 2024-08-26 ENCOUNTER — Emergency Department (HOSPITAL_COMMUNITY)
Admission: EM | Admit: 2024-08-26 | Discharge: 2024-08-26 | Discharge status: Against Medical Advice | Attending: Emergency Medicine | Admitting: Emergency Medicine

## 2024-08-26 ENCOUNTER — Encounter (HOSPITAL_COMMUNITY): Payer: Self-pay

## 2024-08-26 ENCOUNTER — Ambulatory Visit: Attending: Internal Medicine | Admitting: Internal Medicine

## 2024-08-26 ENCOUNTER — Encounter: Payer: Self-pay | Admitting: Internal Medicine

## 2024-08-26 VITALS — BP 190/100 | HR 53 | Ht 62.99 in | Wt 235.0 lb

## 2024-08-26 DIAGNOSIS — D6869 Other thrombophilia: Secondary | ICD-10-CM | POA: Diagnosis not present

## 2024-08-26 DIAGNOSIS — I480 Paroxysmal atrial fibrillation: Secondary | ICD-10-CM

## 2024-08-26 DIAGNOSIS — I2510 Atherosclerotic heart disease of native coronary artery without angina pectoris: Secondary | ICD-10-CM | POA: Diagnosis not present

## 2024-08-26 DIAGNOSIS — I10 Essential (primary) hypertension: Secondary | ICD-10-CM | POA: Diagnosis not present

## 2024-08-26 DIAGNOSIS — I700 Atherosclerosis of aorta: Secondary | ICD-10-CM

## 2024-08-26 DIAGNOSIS — Z91148 Patient's other noncompliance with medication regimen for other reason: Secondary | ICD-10-CM | POA: Diagnosis not present

## 2024-08-26 DIAGNOSIS — E782 Mixed hyperlipidemia: Secondary | ICD-10-CM | POA: Diagnosis not present

## 2024-08-26 DIAGNOSIS — I160 Hypertensive urgency: Secondary | ICD-10-CM

## 2024-08-26 DIAGNOSIS — Z5321 Procedure and treatment not carried out due to patient leaving prior to being seen by health care provider: Secondary | ICD-10-CM | POA: Insufficient documentation

## 2024-08-26 DIAGNOSIS — Z6841 Body Mass Index (BMI) 40.0 and over, adult: Secondary | ICD-10-CM | POA: Diagnosis not present

## 2024-08-26 LAB — CBC
HCT: 46.4 % — ABNORMAL HIGH (ref 36.0–46.0)
Hemoglobin: 14.6 g/dL (ref 12.0–15.0)
MCH: 29 pg (ref 26.0–34.0)
MCHC: 31.5 g/dL (ref 30.0–36.0)
MCV: 92.2 fL (ref 80.0–100.0)
Platelets: 189 K/uL (ref 150–400)
RBC: 5.03 MIL/uL (ref 3.87–5.11)
RDW: 12.7 % (ref 11.5–15.5)
WBC: 7.8 K/uL (ref 4.0–10.5)
nRBC: 0 % (ref 0.0–0.2)

## 2024-08-26 LAB — BASIC METABOLIC PANEL WITH GFR
Anion gap: 12 (ref 5–15)
BUN: 19 mg/dL (ref 8–23)
CO2: 26 mmol/L (ref 22–32)
Calcium: 9.7 mg/dL (ref 8.9–10.3)
Chloride: 104 mmol/L (ref 98–111)
Creatinine, Ser: 1.48 mg/dL — ABNORMAL HIGH (ref 0.44–1.00)
GFR, Estimated: 36 mL/min — ABNORMAL LOW (ref >60)
Glucose, Bld: 95 mg/dL (ref 70–99)
Potassium: 4 mmol/L (ref 3.5–5.1)
Sodium: 142 mmol/L (ref 135–145)

## 2024-08-26 NOTE — ED Triage Notes (Signed)
 Pt here for HTN and is compliant with medications. Denies numbness/tingling/ blurred vision/headaches.

## 2024-08-26 NOTE — Progress Notes (Signed)
 Cardiology Office Note   Date:  08/26/2024  ID:  [name], [name] 1946/08/10, MRN [id number] PCP: Leonarda Roxan BROCKS, NP  [facility] Providers Cardiologist:  Emeline FORBES Calender, MD     History of Present Illness [name] is a 78 y.o. female who presents today with her daughter with a past medical history of paroxysmal atrial fibrillation/flutter (on Eliquis ), former tobacco use, CKD, hyperlipidemia, hypertension, vascular dementia who presents today for follow-up.  Today, the patient's blood pressure initially was 207/91 via auto cuff and was 190/100 by manual measurement.  The patient did take her blood pressure medications this morning but has not taken her afternoon dose as of yet.  She is not having any chest pain, new neurologic deficit (has baseline right-sided weakness from prior stroke), shortness of breath or any other concerning acute symptoms.  At home, she ambulates with a rollator and a walker and does not have any exertional symptoms.  Tobacco use: Quit in 2008  Activity level: Rollator and walker  ROS:  Review of Systems  All other systems reviewed and are negative.   Physical Exam  Physical Exam Vitals and nursing note reviewed.  Constitutional:      Appearance: Normal appearance.  HENT:     Head: Normocephalic and atraumatic.  Eyes:     Conjunctiva/sclera: Conjunctivae normal.  Neck:     Vascular: No carotid bruit.  Cardiovascular:     Rate and Rhythm: Normal rate and regular rhythm.  Pulmonary:     Effort: Pulmonary effort is normal.     Breath sounds: Normal breath sounds.  Musculoskeletal:        General: No tenderness.     Right lower leg: Edema present.     Left lower leg: Edema present.  Skin:    Coloration: Skin is not jaundiced or pale.  Neurological:     Mental Status: She is alert.     VS:  BP (!) 190/100 Comment: manual  Pulse (!) 53   Ht 5' 2.99 (1.6 m)   Wt 235 lb (106.6 kg)   SpO2 96%   BMI 41.64 kg/m         Wt  Readings from Last 3 Encounters:  08/26/24 235 lb (106.6 kg)  06/30/24 238 lb (108 kg)  11/19/23 245 lb 3.2 oz (111.2 kg)     EKG Interpretation Date/Time:  Friday August 26 2024 16:20:09 EDT Ventricular Rate:  51 PR Interval:  176 QRS Duration:  80 QT Interval:  498 QTC Calculation: 458 R Axis:   30  Text Interpretation: Sinus bradycardia Nonspecific ST abnormality When compared with ECG of 08-Jun-2023 15:33, No significant change was found Confirmed by Calender Emeline [phone]) on 08/26/2024 4:24:38 PM    Studies Reviewed   Echo 11/09/2021: EF 65 to 70% with no regional wall motion abnormalities and elevated filling pressures     Risk Assessment/Calculations       HYPERTENSION CONTROL Vitals:   08/26/24 1608 08/26/24 1624 08/26/24 1635  BP: (!) 207/91 (!) 204/99 (!) 190/100    The patient's blood pressure is elevated above target today.  In order to address the patient's elevated BP: -- (Sent to ED via personal transportation)              ASSESSMENT  Hypertensive urgency, currently asymptomatic took her a.m. meds.  Home meds: Hydralazine  25 mg 3 times daily, losartan  100 mg daily, metoprolol  tartrate 12.5 mg twice daily Paroxysmal atrial fibrillation currently in sinus rhythm.  On apixaban  5 mg twice daily, metoprolol  tartrate 12.5 mg twice daily Hypertension Obesity weight down 3 pounds from July CKD 3 Coronary calcifications Aortic atherosclerosis on simvastatin  40 mg Morbid obesity Hyperlipidemia Hypercoagulable state secondary to anticoagulation History of CVA in 2008 History of vascular dementia   Plan  Patient's daughter will take the patient directly to the ED for further management. Advised to monitor her blood pressure and call the office in 2 weeks with the recordings  Follow up: 1 month          Signed, Emeline FORBES Calender, MD

## 2024-08-26 NOTE — ED Triage Notes (Addendum)
 Pt came in via pOV after leaving her Cardiologist today d/t HTN that was noted during her visit. Her BP while in their office was 207/100 then 190/100. A/Ox4, denies pain or HA, denies missing any of BP meds, 180/82 upon arrival to ED.

## 2024-08-26 NOTE — Patient Instructions (Signed)
 Medication Instructions:  No medication changes were made at this visit. Continue current regimen.   *If you need a refill on your cardiac medications before your next appointment, please call your pharmacy*  Lab Work: None ordered today. If you have labs (blood work) drawn today and your tests are completely normal, you will receive your results only by: MyChart Message (if you have MyChart) OR A paper copy in the mail If you have any lab test that is abnormal or we need to change your treatment, we will call you to review the results.  Testing/Procedures: None ordered today.  Follow-Up: At [facility], you and your health needs are our priority.  As part of our continuing mission to provide you with exceptional heart care, our providers are all part of one team.  This team includes your primary Cardiologist (physician) and Advanced Practice Providers or APPs (Physician Assistants and Nurse Practitioners) who all work together to provide you with the care you need, when you need it.  Your next appointment:   1 month(s)  Provider:   Dr. Kriste    We recommend signing up for the patient portal called MyChart.  Sign up information is provided on this After Visit Summary.  MyChart is used to connect with patients for Virtual Visits (Telemedicine).  Patients are able to view lab/test results, encounter notes, upcoming appointments, etc.  Non-urgent messages can be sent to your provider as well.   To learn more about what you can do with MyChart, go to [email or web address].   Other Instructions Please be evaluated by emergency department for high blood pressure.

## 2024-08-26 NOTE — ED Notes (Signed)
 Pt states she wants to leave and will return for worsening symptoms

## 2024-08-29 ENCOUNTER — Ambulatory Visit (INDEPENDENT_AMBULATORY_CARE_PROVIDER_SITE_OTHER): Payer: Self-pay

## 2024-08-29 DIAGNOSIS — I480 Paroxysmal atrial fibrillation: Secondary | ICD-10-CM | POA: Diagnosis not present

## 2024-08-30 ENCOUNTER — Ambulatory Visit: Payer: Self-pay | Admitting: Cardiovascular Disease

## 2024-08-30 LAB — CUP PACEART REMOTE DEVICE CHECK
Date Time Interrogation Session: 20250922140111
Implantable Pulse Generator Implant Date: 20230206

## 2024-08-30 NOTE — Progress Notes (Signed)
 Remote Loop Recorder Transmission

## 2024-09-02 ENCOUNTER — Other Ambulatory Visit: Payer: Self-pay

## 2024-09-02 ENCOUNTER — Encounter: Payer: Self-pay | Admitting: Family

## 2024-09-02 ENCOUNTER — Ambulatory Visit (INDEPENDENT_AMBULATORY_CARE_PROVIDER_SITE_OTHER): Admitting: Family

## 2024-09-02 VITALS — BP 152/98 | HR 60 | Temp 97.8°F | Resp 19 | Ht 63.0 in | Wt 235.2 lb

## 2024-09-02 DIAGNOSIS — I480 Paroxysmal atrial fibrillation: Secondary | ICD-10-CM | POA: Diagnosis not present

## 2024-09-02 DIAGNOSIS — N183 Chronic kidney disease, stage 3 unspecified: Secondary | ICD-10-CM | POA: Diagnosis not present

## 2024-09-02 DIAGNOSIS — I129 Hypertensive chronic kidney disease with stage 1 through stage 4 chronic kidney disease, or unspecified chronic kidney disease: Secondary | ICD-10-CM

## 2024-09-02 DIAGNOSIS — R2681 Unsteadiness on feet: Secondary | ICD-10-CM

## 2024-09-02 DIAGNOSIS — E782 Mixed hyperlipidemia: Secondary | ICD-10-CM | POA: Diagnosis not present

## 2024-09-02 DIAGNOSIS — N3281 Overactive bladder: Secondary | ICD-10-CM | POA: Diagnosis not present

## 2024-09-02 MED ORDER — HYDRALAZINE HCL 50 MG PO TABS
50.0000 mg | ORAL_TABLET | Freq: Three times a day (TID) | ORAL | 1 refills | Status: DC
  Filled 2024-09-02: qty 90, 30d supply, fill #0
  Filled 2024-10-17: qty 90, 30d supply, fill #1

## 2024-09-02 NOTE — Progress Notes (Signed)
 Provider: Roxan Plough FNP-C   Arhum Peeples, Roxan BROCKS, NP  Patient Care Team: Khalfani Weideman, Roxan BROCKS, NP as PCP - General (Family Medicine) Kriste Emeline BRAVO, MD as PCP - Cardiology (Cardiology) Cleatus Collar, MD as Consulting Physician (Ophthalmology)  Extended Emergency Contact Information Primary Emergency Contact: Delsanto,[name] Address: [address] [place] [address] [place] Home Phone: [phone] Mobile Phone: [phone] Relation: Daughter  Code Status:  Full Code  Goals of care: Advanced Directive information    09/02/2024    1:47 PM  Advanced Directives  Does Patient Have a Medical Advance Directive? Yes  Type of Advance Directive Healthcare Power of Attorney  Does patient want to make changes to medical advance directive? No - Patient declined  Copy of Healthcare Power of Attorney in Chart? No - copy requested     Chief Complaint  Patient presents with   Transitions [facility] follow up.      Discussed the use of AI scribe software for clinical note transcription with the patient, who gave verbal consent to proceed.  History of Present Illness   LE FERRAZ is an 78 year old female with atrial fibrillation who presents for follow-up after a hospital visit for elevated blood pressure.  She initially had a routine visit with her cardiologist where her blood pressure was recorded at 207/100 mmHg, prompting a recommendation to go to the emergency room. At the hospital, her blood pressure was 167/100 mmHg, and she was found to be severely dehydrated based on blood work. She did not receive treatment at the hospital due to a lack of available rooms and returned home after waiting for a long time on the waiting room. Daughter states patient had not eaten or taken her medication so left to manage her condition at home. Her blood pressure has decreased since the hospital visit but remains slightly elevated at 152/98 mmHg. No symptoms  such as chest pain, headache, or tingling are present.  Her medical history includes atrial fibrillation and a stroke in 2008. Her blood pressure had not been high since the stroke until the recent incident. Her current medications include metoprolol  titrate 25 mg (half a pill twice a day), furosemide  20 mg, potassium supplements, donepezil , Namenda , oxybutynin  5 mg, hydralazine  25 mg three times daily, losartan  100 mg daily, and alendronate  70 mg weekly. She has experienced some leg swelling, which has decreased since the hospital visit.  Her caregiver has been focusing on ensuring she stays hydrated and monitors her blood pressure at home. She uses a rollator inside the house and a wheelchair outside. Her weight remains stable at 235.2 lbs. Recent blood work showed good electrolyte levels, glucose at 95 mg/dL, and a creatinine level of 1.48 mg/dL, which is stable compared to previous results. There is no sign of infection.    Past Medical History:  Diagnosis Date   Cataract    slow growing    Chronic kidney disease    Hyperlipidemia    Hypertension    Insomnia, unspecified    Memory loss    mild dementia per daughter    Osteopenia    Other abnormal blood chemistry    Other specified cardiac dysrhythmias(427.89)    Other specified disease of nail    Stroke (HCC)    2006 or 2007 per daughter    Syncope and collapse    Unspecified disorder of kidney and ureter    Unspecified late  effects of cerebrovascular disease    Unspecified transient cerebral ischemia    Unspecified urinary incontinence    Unspecified vitamin D deficiency    Vascular dementia, uncomplicated (HCC)    Past Surgical History:  Procedure Laterality Date   CATARACT EXTRACTION Left 09/2022   CATARACT EXTRACTION Right 11/2021   NO PAST SURGERIES      No Known Allergies  Allergies as of 09/02/2024   No Known Allergies      Medication List        Accurate as of September 02, 2024  4:01 PM. If you have any  questions, ask your nurse or doctor.          alendronate  70 MG tablet Commonly known as: FOSAMAX  Take 1 tablet (70 mg total) by mouth every 7 (seven) days. TAKE WITH A FULL GLASS OF WATER ON AN EMPTY STOMACH.   CALCIUM 600+D3 PO Take 1 tablet by mouth 2 (two) times daily.   cholecalciferol 1000 units tablet Commonly known as: VITAMIN D Take 1,000 Units by mouth daily.   donepezil  10 MG tablet Commonly known as: ARICEPT  Take 1 tablet (10 mg total) by mouth at bedtime.   Eliquis  5 MG Tabs tablet Generic drug: apixaban  Take 1 tablet (5 mg total) by mouth 2 (two) times daily.   furosemide  20 MG tablet Commonly known as: LASIX  As needed patient may take 20 MG Furosemide  as needed by mouth once daily x 4 days as directed per [employer] HF Study PRN plan. DO NOT REMOVE   hydrALAZINE  50 MG tablet Commonly known as: APRESOLINE  Take 1 tablet (50 mg total) by mouth 3 (three) times daily with meals. What changed:  medication strength how much to take Changed by: Markavious Micco C Kalon Erhardt   losartan  100 MG tablet Commonly known as: COZAAR  Take 1 tablet (100 mg total) by mouth daily.   memantine  10 MG tablet Commonly known as: NAMENDA  Take 1 tablet (10 mg total) by mouth 2 (two) times daily.   metoprolol  tartrate 25 MG tablet Commonly known as: LOPRESSOR  Take 1/2 tablet (12.5 mg total) by mouth 2 (two) times daily.   multivitamin tablet Take 1 tablet by mouth daily.   oxybutynin  5 MG tablet Commonly known as: DITROPAN  Take 1 tablet (5 mg total) by mouth daily for bladder control.   potassium chloride  SA 20 MEQ tablet Commonly known as: KLOR-CON  M As needed patient may take 20 mEq Potassium as needed by mouth once daily x 4 days as directed per [employer] HF Study PRN plan. DO NOT REMOVE ORDER   simvastatin  40 MG tablet Commonly known as: ZOCOR  Take 1 tablet (40 mg total) by mouth daily at 6 PM.        Review of Systems  Constitutional:  Negative for  appetite change, chills, fatigue, fever and unexpected weight change.  HENT:  Negative for congestion, ear discharge, ear pain, hearing loss, nosebleeds, postnasal drip, rhinorrhea, sinus pressure, sinus pain, sneezing, sore throat, tinnitus and trouble swallowing.   Eyes:  Negative for pain, discharge, redness, itching and visual disturbance.  Respiratory:  Negative for cough, chest tightness, shortness of breath and wheezing.   Cardiovascular:  Positive for leg swelling. Negative for chest pain and palpitations.  Gastrointestinal:  Negative for abdominal distention, abdominal pain, blood in stool, constipation, diarrhea, nausea and vomiting.  Endocrine: Negative for cold intolerance, heat intolerance, polydipsia, polyphagia and polyuria.  Genitourinary:  Negative for difficulty urinating, dysuria, flank pain, frequency and urgency.  Musculoskeletal:  Positive for  gait problem. Negative for arthralgias, back pain, joint swelling, myalgias, neck pain and neck stiffness.  Skin:  Negative for color change, pallor, rash and wound.  Neurological:  Negative for dizziness, syncope, speech difficulty, weakness, light-headedness, numbness and headaches.  Hematological:  Does not bruise/bleed easily.  Psychiatric/Behavioral:  Negative for agitation, behavioral problems, confusion, hallucinations and sleep disturbance. The patient is not nervous/anxious.     Immunization History  Administered Date(s) Administered   Fluad Quad(high Dose 65+) 11/22/2020, 09/06/2021, 10/22/2022   Fluad Trivalent(High Dose 65+) 11/19/2023   INFLUENZA, HIGH DOSE SEASONAL PF 12/25/2017, 08/19/2018   Influenza,inj,Quad PF,6+ Mos 10/17/2013, 12/07/2015, 09/01/2016   Influenza-Unspecified 08/25/2008, 09/12/2011, 09/27/2012   Moderna Covid-19 Fall Seasonal Vaccine 54yrs & older 10/05/2022   PFIZER(Purple Top)SARS-COV-2 Vaccination 12/27/2019, 01/17/2020   Pneumococcal Conjugate-13 08/04/2014   Pneumococcal Polysaccharide-23  03/21/2022   Pneumococcal-Unspecified 05/07/2011   Respiratory Syncytial Virus Vaccine,Recomb Aduvanted(Arexvy) 10/05/2022   Tdap 05/07/2011   Zoster Recombinant(Shingrix) 10/05/2022, 12/09/2022   Pertinent  Health Maintenance Due  Topic Date Due   Influenza Vaccine  10/03/2024 (Originally 07/08/2024)   DEXA SCAN  Completed      06/15/2024    2:54 PM 06/15/2024    3:19 PM 06/30/2024    3:42 PM 08/26/2024    4:11 PM 09/02/2024    1:47 PM  Fall Risk  Falls in the past year? 0 1 1 1  0  Was there an injury with Fall? 0 1 1  0  Fall Risk Category Calculator 0 2 2  0  Patient at Risk for Falls Due to No Fall Risks History of fall(s);Impaired balance/gait;Impaired mobility Impaired balance/gait Impaired mobility No Fall Risks  Fall risk Follow up Falls evaluation completed Falls evaluation completed Falls evaluation completed Falls evaluation completed Falls evaluation completed   Functional Status Survey:    Vitals:   09/02/24 1349  BP: (!) 152/98  Pulse: 60  Resp: 19  Temp: 97.8 F (36.6 C)  SpO2: 97%  Weight: 235 lb 3.2 oz (106.7 kg)  Height: 5' 3 (1.6 m)   Body mass index is 41.66 kg/m. Physical Exam  VITALS: T- 97.8, P- 60, BP- 152/98, SaO2- 99% MEASUREMENTS: Weight- 235.2. GENERAL: Alert, cooperative, well developed, no acute distress HEENT: Normocephalic, normal oropharynx, moist mucous membranes, ears and nose normal NECK: Thyroid  normal CHEST: Clear to auscultation bilaterally, no wheezes, rhonchi, or crackles CARDIOVASCULAR: Normal heart rate and rhythm, S1 and S2 normal without murmurs ABDOMEN: Soft, non-tender, non-distended, without organomegaly, normal bowel sounds EXTREMITIES: Pitting edema in legs, no cyanosis NEUROLOGICAL: Cranial nerves grossly intact, moves all extremities without gross motor or sensory deficit. unsteady gait  PSYCHIATRY/BEHAVIORAL: Mood stable    Labs reviewed: Recent Labs    11/17/23 1020 06/28/24 1028 08/26/24 1727  NA 142 144 142   K 4.2 3.9 4.0  CL 105 107 104  CO2 28 28 26   GLUCOSE 100* 107* 95  BUN 23 18 19   CREATININE 1.46* 1.34* 1.48*  CALCIUM 9.5 9.5 9.7   Recent Labs    11/17/23 1020 06/28/24 1028  AST 18 14  ALT 15 11  BILITOT 0.4 0.8  PROT 6.7 6.7   Recent Labs    11/17/23 1020 06/28/24 1028 08/26/24 1727  WBC 7.5 7.0 7.8  NEUTROABS 2,850 2,604  --   HGB 13.4 13.1 14.6  HCT 41.5 41.7 46.4*  MCV 88.3 91.4 92.2  PLT 234 222 189   Lab Results  Component Value Date   TSH 1.11 06/28/2024   Lab Results  Component Value Date   HGBA1C 5.4 05/02/2021   Lab Results  Component Value Date   CHOL 156 06/28/2024   HDL 63 06/28/2024   LDLCALC 79 06/28/2024   TRIG 63 06/28/2024   CHOLHDL 2.5 06/28/2024    Significant Diagnostic Results in last 30 days:  CUP PACEART REMOTE DEVICE CHECK Result Date: 08/30/2024 ILR summary report received. Battery status OK. Normal device function. No new symptom, tachy, brady, or pause episodes. No new AF episodes. Monthly summary reports and ROV/PRN LA, CVRS   Assessment/Plan  Hypertension with lower extremity edema Hypertension remains elevated at 152/98 mmHg, improved from 207/100 mmHg. Lower extremity edema with pitting is present but has decreased since last Friday. Recent blood pressure increase may be due to dehydration and medication non-compliance during hospital visit. - Increase hydralazine  to 50 mg three times daily using current 25 mg tablets by taking two per dose until finished. - Monitor blood pressure daily with a new blood pressure monitor. - Continue furosemide  for edema management. - Encourage use of compression stockings to reduce edema. - Advise elevating legs when seated to help with edema. - Follow up with cardiologist in two weeks or sooner if possible.  Atrial fibrillation Atrial fibrillation is well-managed with a heart rate of 60 bpm. No recent episodes of palpitations or chest pain. EKG at cardiologist's office was normal. -  Continue current medications: metoprolol  and losartan . - Follow up with cardiologist in two weeks.  CKD stage 3  CR 1.48 previous 1.34 Dehydration identified during recent hospital visit likely contributed to elevated blood pressure. Condition resolved with increased fluid intake. - Ensure adequate hydration.  Hyperlipidemia  - continue on simvastatin   - continue dietary modification and exercise   OAB - continue on Oxybutynin    Unsteady gait  ambulates with walker and uses wheelchair for long distance  - Fall and safety precaution   Family/ staff Communication: Reviewed plan of care with patient verbalized understanding   Labs/tests ordered: None   Next Appointment : Return in about 2 weeks (around 09/16/2024) for Blood pressure.  Spent 30 minutes of Face to face and non-face to face with patient  >50% time spent counseling; reviewing medical record; tests; labs; documentation and developing future plan of care.   Roxan JAYSON Plough, NP

## 2024-09-02 NOTE — Patient Instructions (Signed)
 1.Go to local pharmacy to receive Flu & Covid vaccines.

## 2024-09-09 NOTE — Progress Notes (Signed)
 Remote Loop Recorder Transmission

## 2024-09-14 ENCOUNTER — Other Ambulatory Visit: Payer: Self-pay | Admitting: Adult Health

## 2024-09-14 ENCOUNTER — Other Ambulatory Visit: Payer: Self-pay

## 2024-09-14 ENCOUNTER — Other Ambulatory Visit (HOSPITAL_COMMUNITY): Payer: Self-pay

## 2024-09-14 ENCOUNTER — Encounter

## 2024-09-14 MED ORDER — APIXABAN 5 MG PO TABS
5.0000 mg | ORAL_TABLET | Freq: Two times a day (BID) | ORAL | 5 refills | Status: AC
  Filled 2024-09-14: qty 60, 30d supply, fill #0
  Filled 2024-10-17: qty 60, 30d supply, fill #1
  Filled 2024-11-06 – 2024-11-09 (×2): qty 60, 30d supply, fill #2
  Filled 2024-12-11: qty 60, 30d supply, fill #3
  Filled 2025-01-10: qty 60, 30d supply, fill #4

## 2024-09-14 MED FILL — Simvastatin Tab 40 MG: ORAL | 90 days supply | Qty: 90 | Fill #2 | Status: AC

## 2024-09-22 ENCOUNTER — Ambulatory Visit: Admitting: Family

## 2024-09-22 ENCOUNTER — Encounter: Payer: Self-pay | Admitting: Family

## 2024-09-22 VITALS — BP 136/84 | HR 60 | Temp 97.6°F | Resp 19 | Ht 63.0 in | Wt 233.2 lb

## 2024-09-22 DIAGNOSIS — I129 Hypertensive chronic kidney disease with stage 1 through stage 4 chronic kidney disease, or unspecified chronic kidney disease: Secondary | ICD-10-CM | POA: Diagnosis not present

## 2024-09-22 DIAGNOSIS — I480 Paroxysmal atrial fibrillation: Secondary | ICD-10-CM

## 2024-09-22 DIAGNOSIS — Z23 Encounter for immunization: Secondary | ICD-10-CM

## 2024-09-22 DIAGNOSIS — N183 Chronic kidney disease, stage 3 unspecified: Secondary | ICD-10-CM | POA: Diagnosis not present

## 2024-09-22 DIAGNOSIS — E782 Mixed hyperlipidemia: Secondary | ICD-10-CM

## 2024-09-22 DIAGNOSIS — N3281 Overactive bladder: Secondary | ICD-10-CM

## 2024-09-22 DIAGNOSIS — F0150 Vascular dementia without behavioral disturbance: Secondary | ICD-10-CM | POA: Diagnosis not present

## 2024-09-25 ENCOUNTER — Other Ambulatory Visit: Payer: Self-pay | Admitting: Family

## 2024-09-25 ENCOUNTER — Other Ambulatory Visit (HOSPITAL_COMMUNITY): Payer: Self-pay

## 2024-09-25 DIAGNOSIS — F0150 Vascular dementia without behavioral disturbance: Secondary | ICD-10-CM

## 2024-09-26 ENCOUNTER — Other Ambulatory Visit (HOSPITAL_COMMUNITY): Payer: Self-pay

## 2024-09-26 MED ORDER — MEMANTINE HCL 10 MG PO TABS
10.0000 mg | ORAL_TABLET | Freq: Two times a day (BID) | ORAL | 1 refills | Status: AC
  Filled 2024-09-26: qty 180, 90d supply, fill #0
  Filled 2024-12-23: qty 180, 90d supply, fill #1

## 2024-09-26 MED ORDER — DONEPEZIL HCL 10 MG PO TABS
10.0000 mg | ORAL_TABLET | Freq: Every day | ORAL | 1 refills | Status: AC
  Filled 2024-09-26: qty 90, 90d supply, fill #0
  Filled 2024-12-23: qty 90, 90d supply, fill #1

## 2024-09-29 ENCOUNTER — Ambulatory Visit

## 2024-09-29 DIAGNOSIS — I480 Paroxysmal atrial fibrillation: Secondary | ICD-10-CM | POA: Diagnosis not present

## 2024-09-29 LAB — CUP PACEART REMOTE DEVICE CHECK
Date Time Interrogation Session: 20251022230409
Implantable Pulse Generator Implant Date: 20230206

## 2024-09-30 NOTE — Progress Notes (Signed)
 Remote Loop Recorder Transmission

## 2024-10-03 ENCOUNTER — Ambulatory Visit: Payer: Self-pay | Admitting: Cardiovascular Disease

## 2024-10-03 NOTE — Progress Notes (Signed)
 Provider: Roxan Plough FNP-C   Jameela Michna, Roxan BROCKS, NP  Patient Care Team: Carmon Sahli, Roxan BROCKS, NP as PCP - General (Family Medicine) Kriste Emeline BRAVO, MD as PCP - Cardiology (Cardiology) Cleatus Collar, MD as Consulting Physician (Ophthalmology)  Extended Emergency Contact Information Primary Emergency Contact: Vickroy,[name] Address: [address], [place] [address] [place] Home Phone: [phone] Mobile Phone: [phone] Relation: Daughter  Code Status:  Full Code  Goals of care: Advanced Directive information    09/02/2024    1:47 PM  Advanced Directives  Does Patient Have a Medical Advance Directive? Yes  Type of Advance Directive Healthcare Power of Attorney  Does patient want to make changes to medical advance directive? No - Patient declined  Copy of Healthcare Power of Attorney in Chart? No - copy requested     Chief Complaint  Patient presents with   Follow-up    2 Week follow up for blood pressure.    Discussed the use of AI scribe software for clinical note transcription with the patient, who gave verbal consent to proceed.  History of Present Illness   [name] is an 78 year old female with hypertension who presents for a follow-up visit.  Her blood pressure readings at home have been around 142/88 mmHg, although she is still using her old blood pressure cuff as the new one has not arrived yet. She is currently on hydralazine  100 mg three times daily, metoprolol  12.5 mg twice daily, and furosemide  20 mg daily.  She mentions doing a little bit of exercise, although not much. Her appetite is good, though she feels she eats too much.  No chest pain, palpitations, leg swelling, cough, or shortness of breath. She confirms that she has not experienced any bowel movements today and denies any abdominal pain.   Past Medical History:  Diagnosis Date   Cataract    slow growing    Chronic kidney disease    Hyperlipidemia     Hypertension    Insomnia, unspecified    Memory loss    mild dementia per daughter    Osteopenia    Other abnormal blood chemistry    Other specified cardiac dysrhythmias(427.89)    Other specified disease of nail    Stroke (HCC)    2006 or 2007 per daughter    Syncope and collapse    Unspecified disorder of kidney and ureter    Unspecified late effects of cerebrovascular disease    Unspecified transient cerebral ischemia    Unspecified urinary incontinence    Unspecified vitamin D deficiency    Vascular dementia, uncomplicated (HCC)    Past Surgical History:  Procedure Laterality Date   CATARACT EXTRACTION Left 09/2022   CATARACT EXTRACTION Right 11/2021   NO PAST SURGERIES      No Known Allergies  Allergies as of 09/22/2024   No Known Allergies      Medication List        Accurate as of September 22, 2024 11:59 PM. If you have any questions, ask your nurse or doctor.          alendronate  70 MG tablet Commonly known as: FOSAMAX  Take 1 tablet (70 mg total) by mouth every 7 (seven) days. TAKE WITH A FULL GLASS OF WATER ON AN EMPTY STOMACH.   CALCIUM 600+D3 PO Take 1 tablet by mouth 2 (two) times daily.   cholecalciferol 1000 units tablet Commonly known as: VITAMIN D Take  1,000 Units by mouth daily.   donepezil  10 MG tablet Commonly known as: ARICEPT  Take 1 tablet (10 mg total) by mouth at bedtime.   Eliquis  5 MG Tabs tablet Generic drug: apixaban  Take 1 tablet (5 mg total) by mouth 2 (two) times daily.   furosemide  20 MG tablet Commonly known as: LASIX  As needed patient may take 20 MG Furosemide  as needed by mouth once daily x 4 days as directed per [employer] HF Study PRN plan. DO NOT REMOVE   hydrALAZINE  50 MG tablet Commonly known as: APRESOLINE  Take 1 tablet (50 mg total) by mouth 3 (three) times daily with meals.   losartan  100 MG tablet Commonly known as: COZAAR  Take 1 tablet (100 mg total) by mouth daily.   memantine  10 MG  tablet Commonly known as: NAMENDA  Take 1 tablet (10 mg total) by mouth 2 (two) times daily.   metoprolol  tartrate 25 MG tablet Commonly known as: LOPRESSOR  Take 1/2 tablet (12.5 mg total) by mouth 2 (two) times daily.   multivitamin tablet Take 1 tablet by mouth daily.   oxybutynin  5 MG tablet Commonly known as: DITROPAN  Take 1 tablet (5 mg total) by mouth daily for bladder control.   potassium chloride  SA 20 MEQ tablet Commonly known as: KLOR-CON  M As needed patient may take 20 mEq Potassium as needed by mouth once daily x 4 days as directed per [employer] HF Study PRN plan. DO NOT REMOVE ORDER   simvastatin  40 MG tablet Commonly known as: ZOCOR  Take 1 tablet (40 mg total) by mouth daily at 6 PM.        Review of Systems  Constitutional:  Negative for appetite change, chills, fatigue, fever and unexpected weight change.  HENT:  Negative for congestion, dental problem, ear discharge, ear pain, facial swelling, hearing loss, nosebleeds, postnasal drip, rhinorrhea, sinus pressure, sinus pain, sneezing, sore throat, tinnitus and trouble swallowing.   Eyes:  Negative for pain, discharge, redness, itching and visual disturbance.  Respiratory:  Negative for cough, chest tightness, shortness of breath and wheezing.   Cardiovascular:  Positive for leg swelling. Negative for chest pain and palpitations.  Gastrointestinal:  Negative for abdominal distention, abdominal pain, blood in stool, constipation, diarrhea, nausea and vomiting.  Endocrine: Negative for cold intolerance, heat intolerance, polydipsia, polyphagia and polyuria.  Genitourinary:  Negative for difficulty urinating, dysuria, flank pain, frequency and urgency.  Musculoskeletal:  Positive for gait problem. Negative for arthralgias, back pain, joint swelling, myalgias, neck pain and neck stiffness.  Skin:  Negative for color change, pallor, rash and wound.  Neurological:  Negative for dizziness, syncope, speech  difficulty, weakness, light-headedness, numbness and headaches.  Hematological:  Does not bruise/bleed easily.  Psychiatric/Behavioral:  Negative for agitation, behavioral problems, confusion, hallucinations and sleep disturbance. The patient is not nervous/anxious.        Memory loss     Immunization History  Administered Date(s) Administered   Fluad Quad(high Dose 65+) 11/22/2020, 09/06/2021, 10/22/2022   Fluad Trivalent(High Dose 65+) 11/19/2023   INFLUENZA, HIGH DOSE SEASONAL PF 12/25/2017, 08/19/2018, 09/22/2024   Influenza,inj,Quad PF,6+ Mos 10/17/2013, 12/07/2015, 09/01/2016   Influenza-Unspecified 08/25/2008, 09/12/2011, 09/27/2012   Moderna Covid-19 Fall Seasonal Vaccine 46yrs & older 10/05/2022   PFIZER(Purple Top)SARS-COV-2 Vaccination 12/27/2019, 01/17/2020   Pneumococcal Conjugate-13 08/04/2014   Pneumococcal Polysaccharide-23 03/21/2022   Pneumococcal-Unspecified 05/07/2011   Respiratory Syncytial Virus Vaccine,Recomb Aduvanted(Arexvy) 10/05/2022   Tdap 05/07/2011   Zoster Recombinant(Shingrix) 10/05/2022, 12/09/2022   Pertinent  Health Maintenance Due  Topic Date Due  Influenza Vaccine  Completed   DEXA SCAN  Completed      06/15/2024    2:54 PM 06/15/2024    3:19 PM 06/30/2024    3:42 PM 08/26/2024    4:11 PM 09/02/2024    1:47 PM  Fall Risk  Falls in the past year? 0 1 1 1  0  Was there an injury with Fall? 0 1 1  0  Fall Risk Category Calculator 0 2 2  0  Patient at Risk for Falls Due to No Fall Risks History of fall(s);Impaired balance/gait;Impaired mobility Impaired balance/gait Impaired mobility No Fall Risks  Fall risk Follow up Falls evaluation completed Falls evaluation completed Falls evaluation completed Falls evaluation completed Falls evaluation completed   Functional Status Survey:    Vitals:   09/22/24 1412  BP: 136/84  Pulse: 60  Resp: 19  Temp: 97.6 F (36.4 C)  SpO2: 96%  Weight: 233 lb 3.2 oz (105.8 kg)  Height: 5' 3 (1.6 m)   Body  mass index is 41.31 kg/m. Physical Exam  VITALS: T- 97.6, P- 60, BP- 136/84, SaO2- 96% MEASUREMENTS: Weight- 233. GENERAL: Alert, cooperative, well developed, no acute distress HEENT: Normocephalic, normal oropharynx, moist mucous membranes CHEST: Clear to auscultation bilaterally, no wheezes, rhonchi, or crackles CARDIOVASCULAR: Normal heart rate and rhythm, S1 and S2 normal without murmurs ABDOMEN: Soft, non-tender, non-distended, without organomegaly, normal bowel sounds EXTREMITIES: No cyanosis or edema, non-tender NEUROLOGICAL: Cranial nerves grossly intact, moves all extremities without gross motor or sensory deficit  SKIN: No rash,no lesion or erythema   PSYCHIATRY/BEHAVIORAL: Memory loss.Mood stable   Labs reviewed: Recent Labs    11/17/23 1020 06/28/24 1028 08/26/24 1727  NA 142 144 142  K 4.2 3.9 4.0  CL 105 107 104  CO2 28 28 26   GLUCOSE 100* 107* 95  BUN 23 18 19   CREATININE 1.46* 1.34* 1.48*  CALCIUM 9.5 9.5 9.7   Recent Labs    11/17/23 1020 06/28/24 1028  AST 18 14  ALT 15 11  BILITOT 0.4 0.8  PROT 6.7 6.7   Recent Labs    11/17/23 1020 06/28/24 1028 08/26/24 1727  WBC 7.5 7.0 7.8  NEUTROABS 2,850 2,604  --   HGB 13.4 13.1 14.6  HCT 41.5 41.7 46.4*  MCV 88.3 91.4 92.2  PLT 234 222 189   Lab Results  Component Value Date   TSH 1.11 06/28/2024   Lab Results  Component Value Date   HGBA1C 5.4 05/02/2021   Lab Results  Component Value Date   CHOL 156 06/28/2024   HDL 63 06/28/2024   LDLCALC 79 06/28/2024   TRIG 63 06/28/2024   CHOLHDL 2.5 06/28/2024    Significant Diagnostic Results in last 30 days:  CUP PACEART REMOTE DEVICE CHECK Result Date: 09/29/2024 ILR summary report received. Battery status OK. Normal device function. No new symptom, tachy, brady, or pause episodes. No new AF episodes. Monthly summary reports and ROV/PRN LA, CVRS   Assessment/Plan     Hypertension/CKD stage 3  Blood pressure readings at home average  142/88 mmHg, with today's reading at 136/84 mmHg. No chest pain, palpitations, shortness of breath, or leg swelling reported. - Continue hydralazine  50 mg three times daily, metoprolol  12.5 mg twice daily, furosemide  20 mg daily - Monitor blood pressure at home and report if consistently 140/90 mmHg or higher - Record blood pressure readings and upload to MyChart for review - Encourage regular exercise and ensure adequate hydration  Lower extremity edema Mild puffiness in  the lower extremities without significant swelling, tenderness, or pain. - Wear compression stockings, especially when sitting for long periods - Perform leg exercises to promote circulation  Hyperlipidemia - Continue simvastatin  - Dietary modification and exercise as tolerated  Atrial fibrillation Asymptomatic -Continue on Eliquis   Overactive bladder -Symptoms stable on oxybutynin   Vascular dementia Continue with supportive care   General Health Maintenance Immunizations are up to date, including flu shot. Tetanus and COVID vaccinations are pending. - Administer tetanus and COVID vaccinations when due   Family/ staff Communication: Reviewed plan of care with patient and daughter verbalized understanding  Labs/tests ordered: None   Next Appointment : Return if symptoms worsen or fail to improve.   Spent 30 minutes of Face to face and non-face to face with patient  >50% time spent counseling; reviewing medical record; tests; labs; documentation and developing future plan of care.   Roxan JAYSON Plough, NP

## 2024-10-17 ENCOUNTER — Other Ambulatory Visit (HOSPITAL_COMMUNITY): Payer: Self-pay

## 2024-10-19 ENCOUNTER — Encounter

## 2024-10-30 ENCOUNTER — Ambulatory Visit

## 2024-11-02 ENCOUNTER — Ambulatory Visit

## 2024-11-02 DIAGNOSIS — I480 Paroxysmal atrial fibrillation: Secondary | ICD-10-CM | POA: Diagnosis not present

## 2024-11-02 LAB — CUP PACEART REMOTE DEVICE CHECK
Date Time Interrogation Session: 20251125230128
Implantable Pulse Generator Implant Date: 20230206

## 2024-11-04 ENCOUNTER — Ambulatory Visit: Payer: Self-pay | Admitting: Cardiovascular Disease

## 2024-11-04 NOTE — Progress Notes (Signed)
 Remote Loop Recorder Transmission

## 2024-11-06 ENCOUNTER — Other Ambulatory Visit: Payer: Self-pay | Admitting: Family

## 2024-11-06 ENCOUNTER — Other Ambulatory Visit (HOSPITAL_COMMUNITY): Payer: Self-pay

## 2024-11-07 ENCOUNTER — Other Ambulatory Visit (HOSPITAL_COMMUNITY): Payer: Self-pay

## 2024-11-07 ENCOUNTER — Other Ambulatory Visit: Payer: Self-pay

## 2024-11-07 MED ORDER — OXYBUTYNIN CHLORIDE 5 MG PO TABS
5.0000 mg | ORAL_TABLET | Freq: Every day | ORAL | 1 refills | Status: AC
  Filled 2024-11-07: qty 90, 90d supply, fill #0

## 2024-11-23 ENCOUNTER — Encounter

## 2024-11-30 ENCOUNTER — Ambulatory Visit

## 2024-12-03 ENCOUNTER — Ambulatory Visit: Attending: Cardiovascular Disease

## 2024-12-03 DIAGNOSIS — I480 Paroxysmal atrial fibrillation: Secondary | ICD-10-CM

## 2024-12-05 ENCOUNTER — Ambulatory Visit: Payer: Self-pay | Admitting: Cardiovascular Disease

## 2024-12-05 LAB — CUP PACEART REMOTE DEVICE CHECK
Date Time Interrogation Session: 20251226231633
Implantable Pulse Generator Implant Date: 20230206

## 2024-12-11 ENCOUNTER — Other Ambulatory Visit: Payer: Self-pay | Admitting: Family

## 2024-12-11 DIAGNOSIS — N183 Chronic kidney disease, stage 3 unspecified: Secondary | ICD-10-CM

## 2024-12-12 ENCOUNTER — Other Ambulatory Visit (HOSPITAL_COMMUNITY): Payer: Self-pay

## 2024-12-12 MED ORDER — HYDRALAZINE HCL 50 MG PO TABS
50.0000 mg | ORAL_TABLET | Freq: Three times a day (TID) | ORAL | 1 refills | Status: AC
  Filled 2024-12-12: qty 90, 30d supply, fill #0
  Filled 2025-01-10: qty 90, 30d supply, fill #1

## 2024-12-12 MED ORDER — SIMVASTATIN 40 MG PO TABS
40.0000 mg | ORAL_TABLET | Freq: Every day | ORAL | 1 refills | Status: AC
  Filled 2024-12-12: qty 90, 90d supply, fill #0

## 2024-12-12 NOTE — Progress Notes (Signed)
 Remote Loop Recorder Transmission

## 2024-12-13 ENCOUNTER — Other Ambulatory Visit: Payer: Self-pay

## 2024-12-13 ENCOUNTER — Encounter: Payer: Self-pay | Admitting: Pharmacist

## 2024-12-15 ENCOUNTER — Other Ambulatory Visit: Payer: Self-pay

## 2024-12-15 ENCOUNTER — Other Ambulatory Visit (HOSPITAL_COMMUNITY): Payer: Self-pay

## 2024-12-23 ENCOUNTER — Other Ambulatory Visit (HOSPITAL_COMMUNITY): Payer: Self-pay

## 2024-12-28 ENCOUNTER — Encounter

## 2024-12-31 ENCOUNTER — Ambulatory Visit

## 2025-01-02 ENCOUNTER — Ambulatory Visit: Payer: Self-pay | Admitting: Family

## 2025-01-03 ENCOUNTER — Ambulatory Visit: Attending: Cardiovascular Disease

## 2025-01-03 ENCOUNTER — Ambulatory Visit: Payer: Self-pay | Admitting: Cardiovascular Disease

## 2025-01-03 DIAGNOSIS — I480 Paroxysmal atrial fibrillation: Secondary | ICD-10-CM | POA: Diagnosis not present

## 2025-01-03 LAB — CUP PACEART REMOTE DEVICE CHECK
Date Time Interrogation Session: 20260126231526
Implantable Pulse Generator Implant Date: 20230206

## 2025-01-10 ENCOUNTER — Other Ambulatory Visit: Payer: Self-pay

## 2025-01-11 NOTE — Progress Notes (Signed)
 Remote Loop Recorder Transmission

## 2025-01-27 ENCOUNTER — Ambulatory Visit: Admitting: Family

## 2025-01-31 ENCOUNTER — Ambulatory Visit

## 2025-02-03 ENCOUNTER — Ambulatory Visit

## 2025-03-03 ENCOUNTER — Ambulatory Visit
